# Patient Record
Sex: Male | Born: 1935 | Race: White | Hispanic: No | Marital: Married | Smoking: Former smoker
Health system: Southern US, Community
[De-identification: ages and names within clinical notes are randomized; demographics above are authoritative.]

## PROBLEM LIST (undated history)

## (undated) DIAGNOSIS — F419 Anxiety disorder, unspecified: Secondary | ICD-10-CM

## (undated) DIAGNOSIS — M1990 Unspecified osteoarthritis, unspecified site: Secondary | ICD-10-CM

## (undated) DIAGNOSIS — I639 Cerebral infarction, unspecified: Secondary | ICD-10-CM

## (undated) DIAGNOSIS — F0390 Unspecified dementia without behavioral disturbance: Secondary | ICD-10-CM

## (undated) DIAGNOSIS — I10 Essential (primary) hypertension: Secondary | ICD-10-CM

## (undated) HISTORY — DX: Unspecified osteoarthritis, unspecified site: M19.90

## (undated) HISTORY — DX: Cerebral infarction, unspecified: I63.9

## (undated) HISTORY — DX: Essential (primary) hypertension: I10

## (undated) HISTORY — DX: Unspecified dementia, unspecified severity, without behavioral disturbance, psychotic disturbance, mood disturbance, and anxiety: F03.90

## (undated) HISTORY — PX: OTHER SURGICAL HISTORY: SHX169

## (undated) HISTORY — DX: Anxiety disorder, unspecified: F41.9

---

## 2004-07-03 ENCOUNTER — Ambulatory Visit: Payer: Self-pay | Admitting: Ophthalmology

## 2004-07-10 ENCOUNTER — Ambulatory Visit: Payer: Self-pay | Admitting: Ophthalmology

## 2004-10-26 ENCOUNTER — Ambulatory Visit: Payer: Self-pay | Admitting: Ophthalmology

## 2004-10-31 ENCOUNTER — Ambulatory Visit: Payer: Self-pay | Admitting: Ophthalmology

## 2005-04-02 ENCOUNTER — Other Ambulatory Visit: Payer: Self-pay

## 2005-04-02 ENCOUNTER — Ambulatory Visit: Payer: Self-pay | Admitting: Surgery

## 2005-04-08 ENCOUNTER — Ambulatory Visit: Payer: Self-pay | Admitting: Surgery

## 2008-02-22 ENCOUNTER — Other Ambulatory Visit: Payer: Self-pay

## 2008-02-22 ENCOUNTER — Emergency Department: Payer: Self-pay

## 2008-02-24 ENCOUNTER — Ambulatory Visit: Payer: Self-pay | Admitting: Internal Medicine

## 2008-03-09 ENCOUNTER — Ambulatory Visit: Payer: Self-pay | Admitting: Orthopedic Surgery

## 2008-09-15 ENCOUNTER — Ambulatory Visit: Payer: Self-pay | Admitting: Internal Medicine

## 2009-07-12 ENCOUNTER — Ambulatory Visit: Payer: Self-pay | Admitting: Internal Medicine

## 2009-07-17 ENCOUNTER — Ambulatory Visit: Payer: Self-pay | Admitting: Vascular Surgery

## 2009-07-21 ENCOUNTER — Ambulatory Visit: Payer: Self-pay | Admitting: Vascular Surgery

## 2009-07-26 ENCOUNTER — Inpatient Hospital Stay: Payer: Self-pay | Admitting: Vascular Surgery

## 2009-08-31 ENCOUNTER — Inpatient Hospital Stay: Payer: Self-pay | Admitting: Vascular Surgery

## 2009-09-06 ENCOUNTER — Ambulatory Visit: Payer: Self-pay | Admitting: Internal Medicine

## 2012-04-15 ENCOUNTER — Ambulatory Visit: Payer: Self-pay | Admitting: Otolaryngology

## 2012-05-06 ENCOUNTER — Ambulatory Visit: Payer: Self-pay | Admitting: Otolaryngology

## 2012-08-26 HISTORY — PX: SPINE SURGERY: SHX786

## 2013-09-26 ENCOUNTER — Emergency Department: Payer: Self-pay | Admitting: Internal Medicine

## 2013-09-26 LAB — COMPREHENSIVE METABOLIC PANEL
ALBUMIN: 3.3 g/dL — AB (ref 3.4–5.0)
ALK PHOS: 46 U/L
ALT: 15 U/L (ref 12–78)
ANION GAP: 4 — AB (ref 7–16)
AST: 28 U/L (ref 15–37)
BUN: 22 mg/dL — ABNORMAL HIGH (ref 7–18)
Bilirubin,Total: 0.5 mg/dL (ref 0.2–1.0)
CO2: 27 mmol/L (ref 21–32)
Calcium, Total: 9 mg/dL (ref 8.5–10.1)
Chloride: 105 mmol/L (ref 98–107)
Creatinine: 1.14 mg/dL (ref 0.60–1.30)
EGFR (African American): >60
GFR CALC NON AF AMER: 59 — AB
Glucose: 85 mg/dL (ref 65–99)
OSMOLALITY: 275 (ref 275–301)
Potassium: 4.7 mmol/L (ref 3.5–5.1)
Sodium: 136 mmol/L (ref 136–145)
TOTAL PROTEIN: 7.4 g/dL (ref 6.4–8.2)

## 2013-09-26 LAB — URINALYSIS, COMPLETE
BILIRUBIN, UR: NEGATIVE
BLOOD: NEGATIVE
Bacteria: NONE SEEN
Glucose,UR: NEGATIVE mg/dL (ref 0–75)
Leukocyte Esterase: NEGATIVE
NITRITE: NEGATIVE
PROTEIN: NEGATIVE
Ph: 6 (ref 4.5–8.0)
SQUAMOUS EPITHELIAL: NONE SEEN
Specific Gravity: 1.015 (ref 1.003–1.030)
WBC UR: <1 /HPF (ref 0–5)

## 2013-09-26 LAB — CBC
HCT: 41.8 % (ref 40.0–52.0)
HGB: 13.9 g/dL (ref 13.0–18.0)
MCH: 29.1 pg (ref 26.0–34.0)
MCHC: 33.2 g/dL (ref 32.0–36.0)
MCV: 88 fL (ref 80–100)
Platelet: 166 10*3/uL (ref 150–440)
RBC: 4.76 10*6/uL (ref 4.40–5.90)
RDW: 14.4 % (ref 11.5–14.5)
WBC: 8 10*3/uL (ref 3.8–10.6)

## 2013-09-26 LAB — TROPONIN I: Troponin-I: <0.02 ng/mL

## 2013-09-26 LAB — CK TOTAL AND CKMB (NOT AT ARMC)
CK, Total: 86 U/L (ref 35–232)
CK-MB: 1.1 ng/mL (ref 0.5–3.6)

## 2013-09-27 ENCOUNTER — Ambulatory Visit: Payer: Self-pay | Admitting: Family Medicine

## 2013-09-29 ENCOUNTER — Ambulatory Visit: Payer: Self-pay | Admitting: Family Medicine

## 2015-07-25 ENCOUNTER — Encounter: Payer: Self-pay | Admitting: Physical Therapy

## 2015-07-25 ENCOUNTER — Ambulatory Visit: Payer: Medicare Other | Attending: Student | Admitting: Physical Therapy

## 2015-07-25 VITALS — BP 140/70

## 2015-07-25 DIAGNOSIS — R279 Unspecified lack of coordination: Secondary | ICD-10-CM | POA: Diagnosis present

## 2015-07-25 DIAGNOSIS — R269 Unspecified abnormalities of gait and mobility: Secondary | ICD-10-CM | POA: Diagnosis present

## 2015-07-25 NOTE — Patient Instructions (Addendum)
         Currently your bladder irritant to water ratio is 5: 0   And you want to eventually balance out the ratio so water is double the bladder irritant amount Bladder irritant (2 cup of coffee, 1 (8 fl oz)  of soda, 1 (12 fl oz) Tea)  Water: sipping only with medication  This week:  Decrease coffee intake from 2 cups to 1 cup,   Increase water intake to more than just when taking medications. Make sure to drink 1 serving of full glass (12 fl oz)  of water each day .

## 2015-07-26 NOTE — Therapy (Addendum)
[place] [facility] MAIN [facility] SERVICES [address] [place], [place], [address] Phone: [phone]   Fax:  [phone]  Physical Therapy Evaluation  Patient Details  Name: David [name] MRN: [id number] Date of Birth: 1926-09-18 Referring Provider: Harmon Dun   Encounter Date: 07/25/2015      PT End of Session - 07/26/15 1422    Visit Number 1   Number of Visits 8   Date for PT Re-Evaluation 09/12/15   Authorization Type G code    PT Start Time 1105   PT Stop Time 1210   PT Time Calculation (min) 65 min   Activity Tolerance Patient tolerated treatment well;No increased pain   Behavior During Therapy [facility] for tasks assessed/performed      Past Medical History  Diagnosis Date  . Anxiety   . Hypertension   . Dementia     Dx in May  . Arthritis     Rheumatoid  . Stroke [employer] [facility])     Past Surgical History  Procedure Laterality Date  . 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 Spine surgery  2014    "ruptured disc"     Filed Vitals:   07/25/15 1126  BP: 140/70    Visit Diagnosis:  Abnormality of gait - Plan: PT plan of care cert/re-cert  Lack of coordination - Plan: PT plan of care cert/re-cert      Subjective Assessment - 07/26/15 1420    Subjective Pt reports fecal incontinence for a year with worsening of Sx over the last few months. (Pt's dtr-in law reports he has started to complain about it).  Pt has had constipation in the past and  has been taking Clear Lax and suppositories  for a " couple" of years.  Currently pt reports bowel movements every other day with loose stool 50% of the time Type 7 Bristol  Stool Scale. Pt and dtr in- law reported last colonscopy 10 years ago. Denied blood in stool nor pain. Fecal and urine incontinence occur with bowel movement and urination. Pt states he sometimes has to push something back in after bowel movements. Pt continues to take Clear Lax one time a day.  Fluid Day: Dtr in law  reports pt drinks "very little water"., 1-2 cups of coffee, 8 oz of soda,  12 fl of tea.  Pt reports eating  fruits "very seldom" and  dtr -in-law reports there is poor compliance to recommendations given by dietitian.     Patient is accompained by: --  Dtr in law    Pertinent History Curerntly under stress because his wife is in a rehab facility due to "getting sick"   Patient Stated Goals " get a good appetite back, eat better foods" and decrease leakage.             [facility] PT Assessment - 07/26/15 1420    Assessment   Medical Diagnosis fecal incontinence    Referring Provider Harmon Dun    Precautions   Precautions None   Restrictions   Weight Bearing Restrictions No   Balance Screen   Has the patient fallen in the past 6 months Yes   How many times? --  2-3x  dtr-in law reported may be due to BP meds    Cognition   Memory Impaired   Dx w. dementia 6 months ago   Observation/Other Assessments   Other Surveys  Other Surveys   Coordination   Gross Motor Movements are Fluid and  Coordinated --  lumbopelvic instability with ASLR   Coordination and Movement Description abdominal/ pelvic floor mm straining w/ cue for bowel movement, pelvic floor contraction elicited with palpation at medial isch tub  through clothes   Posture/Postural Control   Posture Comments limited diaphragmatic excurion,    Bed Mobility   Bed Mobility --  half crunch,pt able to log roll IND w/ cues   Ambulation/Gait   Assistive device Straight cane   Gait Pattern --  narrow BOS, cued wider BOS: pt reported feeling more stable   Gait velocity 0.6 m/s                    [facility] Adult PT Treatment/Exercise - 07/26/15 1420    Ambulation/Gait   Gait Comments cued for wider BOS    Self-Care   Self-Care --  see pt instructions, POC, goals, anatomy and physiology   Neuro Re-ed    Neuro Re-ed Details  see pt instructions                      PT Long Term Goals - 07/26/15 1432     PT LONG TERM GOAL #1   Title Pt will decrease his COREFO score from 24% to < 20% in order to show improved bowel function.   Time 8   Period Weeks   Status New   PT LONG TERM GOAL #2   Title Pt will demo increased walking speed from 0.6 m/s w/ SPC to 0.8 m/s w/ SPC in order to decrease risk for falls as a home ambulator.   Time 8   Period Weeks   Status New   PT LONG TERM GOAL #3   Title Pt will demo proper toileting technique with proper breathing technique (less straining) and compliance to increasing water intake to 1 glass a day in order to improve bowel.urinary function.    Time 8   Period Weeks   Status New               Plan - 07/26/15 1423    Clinical Impression Statement Pt is a 81 yo male whose S & Sx consist of urinary and fecal incontinence,  poor coordination of deep core mmm,  straining of abominal and pelvic floor mm w./ cue to simulate bowel movement and with OOB technique, gait deviations, and Hx of constipation with poor bladder/bowel health habits. Plan to perform an external rectal examination w/ undergarments doffed with pt' s consent and family member present  in the room in order to address pt's c/o "pushing something  back up" after bowel movement. Internal rectal assessment will be contraindicated due to pt's dementia status.     Pt will benefit from skilled therapeutic intervention in order to improve on the following deficits Decreased coordination;Decreased safety awareness;Impaired flexibility;Postural dysfunction;Decreased range of motion;Impaired perceived functional ability;Improper body mechanics;Difficulty walking;Decreased mobility;Decreased balance;Decreased activity tolerance;Decreased strength;Decreased endurance;Abnormal gait   Rehab Potential Fair   Clinical Impairments Affecting Rehab Potential Dementia, Plan to educate pt and family on prognosis after detailed rectal exam and pt's ability to show carry-over motor control education   PT  Frequency 1x / week   PT Duration 8 weeks   PT Treatment/Interventions ADLs/Self Care Home Management;Neuromuscular re-education;Moist Heat;Traction;Electrical Stimulation;Gait training;Stair training;Functional mobility training;Therapeutic activities;Therapeutic exercise;Balance training;Manual techniques;Patient/family education   Consulted and Agree with Plan of Care Patient;Family member/caregiver   Family Member Consulted --  dtr in law  G-Codes - 07/26/15 1436    Functional Assessment Tool Used COREFO 24%   Functional Limitation Self care   Self Care Current Status (L4562) At least 20 percent but less than 40 percent impaired, limited or restricted   Self Care Goal Status (B6389) At least 1 percent but less than 20 percent impaired, limited or restricted       Problem List There are no active problems to display for this patient.   Mariane Masters  ,PT, DPT, E-RYT  07/26/2015, 2:41 PM  [place] [facility] MAIN [facility] SERVICES [address] [place], [place], [address] Phone: [phone]   Fax:  [phone]  Name: David [name] MRN: [id number] Date of Birth: [birth year]

## 2015-07-26 NOTE — Addendum Note (Signed)
Addended by: Mariane Masters on: 07/26/2015 02:40 PM   Modules accepted: Orders

## 2015-08-01 ENCOUNTER — Ambulatory Visit: Payer: Medicare Other | Attending: Student | Admitting: Physical Therapy

## 2015-08-01 DIAGNOSIS — R269 Unspecified abnormalities of gait and mobility: Secondary | ICD-10-CM

## 2015-08-01 DIAGNOSIS — R279 Unspecified lack of coordination: Secondary | ICD-10-CM | POA: Insufficient documentation

## 2015-08-01 NOTE — Patient Instructions (Signed)
Continue to following resources for water and fiber intake   To improve coordination of pelvic floor muscles:  Practice breathing while having bowel movements and avoid straining the abdomen and pelvic floor muscles.   Try squatty potty   To decrease the redness around the anus: Avoid wiping with hard pressure, buy softer toilet paper and dampen it over the sink

## 2015-08-02 NOTE — Therapy (Signed)
[facility] MAIN [facility] SERVICES [address] [place], [place], [address] Phone: [phone]   Fax:  [phone]  Physical Therapy Treatment  Patient Details  Name: David [name] MRN: [id number] Date of Birth: [birth year] Referring Provider: Harmon Dun   Encounter Date: 08/01/2015      PT End of Session - 08/02/15 1329    Visit Number 2   Number of Visits 8   Date for PT Re-Evaluation 09/12/15   Authorization Type G code    PT Start Time 1100   PT Stop Time 1200   PT Time Calculation (min) 60 min   Activity Tolerance Patient tolerated treatment well;No increased pain   Behavior During Therapy [facility] for tasks assessed/performed      Past Medical History  Diagnosis Date  . Anxiety   . Hypertension   . Dementia     Dx in May  . Arthritis     Rheumatoid  . Stroke [facility])     Past Surgical History  Procedure Laterality Date  . 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 Spine surgery  2014    "ruptured disc"     There were no vitals filed for this visit.  Visit Diagnosis:  Abnormality of gait  Lack of coordination      Subjective Assessment - 08/01/15 1114    Subjective (p) Pt reported he tried increasing water intake but son shakes his head and says they try to educate him but pt always replies "he will drink as much water as he likes". Pt had a question about colonoscopy and wanted to know if PT would perform this procedure. PT educated pt that his GI MD would be the one to do that.   Pt's son guessed that pt's last colonoscopy was probably about 5 years ago but was unsure.    Patient is accompained by: (p) --  Dtr in law    Pertinent History (p) Curerntly under stress because his wife is in a rehab facility due to "getting sick"   Patient Stated Goals (p) " get a good appetite back, eat better foods" and decrease leakage.             [facility] PT Assessment - 08/02/15 1317    Assessment   Medical Diagnosis  fecal incontinence    Cognition   Memory Impaired   Dx w. dementia 6 months ago   Ambulation/Gait   Gait Comments minor cue for BOS                  Pelvic Floor Special Questions - 08/01/15 1158    Skin Integrity --  Noted redness around anal sphincter   External Palpation skin rag noted near R posterior,   Palpation noted straining abdominal/ pelvic floor   able demo proper pelvic floor coordination           [facility] Adult PT Treatment/Exercise - 08/02/15 1317    Self-Care   Other Self-Care Comments  educated pt and son re: importance of water and fiber intake, proper pelvic floor coordination, decreased straining , use of squatty potty, using softer tissue paper and damping it  for skin integrity around anus.  PT to inform GI MD re: skin tag.    Neuro Re-ed    Neuro Re-ed Details  inhalation with relaxed pelvic floor, proper sitting posture to decrease pressure on anus  PT Education - 08/02/15 1316    Education provided Yes   Education Details Educated son and pt   Person(s) Educated Patient;Child(ren)   Methods Explanation;Demonstration;Tactile cues;Verbal cues;Handout   Comprehension Verbalized understanding;Returned demonstration;Verbal cues required;Tactile cues required             PT Long Term Goals - 07/26/15 1432    PT LONG TERM GOAL #1   Title Pt will decrease his COREFO score from 24% to < 20% in order to show improved bowel function.   Time 8   Period Weeks   Status New   PT LONG TERM GOAL #2   Title Pt will demo increased walking speed from 0.6 m/s w/ SPC to 0.8 m/s w/ SPC in order to decrease risk for falls as a home ambulator.   Time 8   Period Weeks   Status New   PT LONG TERM GOAL #3   Title Pt will demo proper toileting technique with proper breathing technique (less straining) and compliance to increasing water intake to 1 glass a day in order to improve bowel.urinary function.    Time 8   Period Weeks    Status New               Plan - 08/02/15 1329    Clinical Impression Statement PT external exam today showed pt  w/ a skin tag near R posterior area by anal sphincter which may be the reason pt reports feeling as if he has to "push something back up" and to perform additional wiping w/ washcloth after toilet paper wiping with bowel movements. Intrarectal exam was withheld due to pt's cognitive status and thus,  possibility of rectocele was not able to be ruled out. Also noted redness around anus and pt/son were educated about replacing wiping with dry harsh toilet paper with softer, dampening toilet paper. Expressed to son to monitor the redness.    Pt's son guessed that pt's last colonoscopy was probably about 5 years ago but was unsure. Pt expressed he will try to be more compliant with increasing his water intake and practicing recommendations. Provided pt and son info re: [employer] and free lunches offered there in order to promote healthy eating and  socialization. Pt expressed interest. Withholding PT for a month to allow pt to build compliance with proper toileting and bladder/ bowel health habits.    Pt will benefit from skilled therapeutic intervention in order to improve on the following deficits Decreased coordination;Decreased safety awareness;Impaired flexibility;Postural dysfunction;Decreased range of motion;Impaired perceived functional ability;Improper body mechanics;Difficulty walking;Decreased mobility;Decreased balance;Decreased activity tolerance;Decreased strength;Decreased endurance;Abnormal gait   Rehab Potential Fair   Clinical Impairments Affecting Rehab Potential dementia    PT Frequency 1x / week   PT Duration 8 weeks   PT Treatment/Interventions ADLs/Self Care Home Management;Neuromuscular re-education;Moist Heat;Traction;Electrical Stimulation;Gait training;Stair training;Functional mobility training;Therapeutic activities;Therapeutic exercise;Balance  training;Manual techniques;Patient/family education   Consulted and Agree with Plan of Care Patient;Family member/caregiver   Family Member Consulted --  dtr in law        Problem List There are no active problems to display for this patient.   Mariane Masters ,PT, DPT, E-RYT  08/02/2015, 1:39 PM  [place] MAIN [facility] SERVICES [address] [place], [place], [address] Phone: [phone]   Fax:  [phone]  Name: David [name] MRN: [id number] Date of Birth: 12-May-1927

## 2015-08-08 ENCOUNTER — Encounter: Payer: Medicare Other | Admitting: Physical Therapy

## 2015-08-15 ENCOUNTER — Encounter: Payer: Medicare Other | Admitting: Physical Therapy

## 2015-08-22 ENCOUNTER — Encounter: Payer: Medicare Other | Admitting: Physical Therapy

## 2015-08-31 ENCOUNTER — Ambulatory Visit: Payer: Medicare Other | Attending: Student | Admitting: Physical Therapy

## 2015-09-01 ENCOUNTER — Telehealth: Payer: Self-pay | Admitting: Physical Therapy

## 2016-08-08 ENCOUNTER — Emergency Department: Payer: Medicare Other

## 2016-08-08 ENCOUNTER — Encounter: Payer: Self-pay | Admitting: Intensive Care

## 2016-08-08 ENCOUNTER — Observation Stay
Admission: EM | Admit: 2016-08-08 | Discharge: 2016-08-09 | Disposition: A | Discharge status: Home / Self Care | Payer: Medicare Other | Attending: Internal Medicine | Admitting: Internal Medicine

## 2016-08-08 DIAGNOSIS — R42 Dizziness and giddiness: Secondary | ICD-10-CM | POA: Diagnosis not present

## 2016-08-08 DIAGNOSIS — N183 Chronic kidney disease, stage 3 (moderate): Secondary | ICD-10-CM | POA: Insufficient documentation

## 2016-08-08 DIAGNOSIS — Z87891 Personal history of nicotine dependence: Secondary | ICD-10-CM | POA: Insufficient documentation

## 2016-08-08 DIAGNOSIS — I700 Atherosclerosis of aorta: Secondary | ICD-10-CM | POA: Insufficient documentation

## 2016-08-08 DIAGNOSIS — F0390 Unspecified dementia without behavioral disturbance: Secondary | ICD-10-CM | POA: Diagnosis not present

## 2016-08-08 DIAGNOSIS — H409 Unspecified glaucoma: Secondary | ICD-10-CM | POA: Insufficient documentation

## 2016-08-08 DIAGNOSIS — F419 Anxiety disorder, unspecified: Secondary | ICD-10-CM | POA: Diagnosis not present

## 2016-08-08 DIAGNOSIS — R001 Bradycardia, unspecified: Secondary | ICD-10-CM

## 2016-08-08 DIAGNOSIS — I129 Hypertensive chronic kidney disease with stage 1 through stage 4 chronic kidney disease, or unspecified chronic kidney disease: Secondary | ICD-10-CM | POA: Diagnosis not present

## 2016-08-08 DIAGNOSIS — N400 Enlarged prostate without lower urinary tract symptoms: Secondary | ICD-10-CM | POA: Diagnosis not present

## 2016-08-08 DIAGNOSIS — R55 Syncope and collapse: Principal | ICD-10-CM

## 2016-08-08 DIAGNOSIS — M069 Rheumatoid arthritis, unspecified: Secondary | ICD-10-CM | POA: Insufficient documentation

## 2016-08-08 DIAGNOSIS — Z8673 Personal history of transient ischemic attack (TIA), and cerebral infarction without residual deficits: Secondary | ICD-10-CM | POA: Diagnosis not present

## 2016-08-08 LAB — CBC WITH DIFFERENTIAL/PLATELET
BASOS ABS: 0 10*3/uL (ref 0–0.1)
BASOS PCT: 1 %
EOS PCT: 3 %
Eosinophils Absolute: 0.3 10*3/uL (ref 0–0.7)
HCT: 38.2 % — ABNORMAL LOW (ref 40.0–52.0)
Hemoglobin: 13 g/dL (ref 13.0–18.0)
LYMPHS PCT: 33 %
Lymphs Abs: 2.6 10*3/uL (ref 1.0–3.6)
MCH: 29.7 pg (ref 26.0–34.0)
MCHC: 34.1 g/dL (ref 32.0–36.0)
MCV: 87 fL (ref 80.0–100.0)
Monocytes Absolute: 0.8 10*3/uL (ref 0.2–1.0)
Monocytes Relative: 10 %
Neutro Abs: 4.2 10*3/uL (ref 1.4–6.5)
Neutrophils Relative %: 53 %
PLATELETS: 173 10*3/uL (ref 150–440)
RBC: 4.39 MIL/uL — AB (ref 4.40–5.90)
RDW: 14.5 % (ref 11.5–14.5)
WBC: 7.9 10*3/uL (ref 3.8–10.6)

## 2016-08-08 LAB — COMPREHENSIVE METABOLIC PANEL
ALBUMIN: 3.3 g/dL — AB (ref 3.5–5.0)
ALT: 13 U/L — AB (ref 17–63)
AST: 19 U/L (ref 15–41)
Alkaline Phosphatase: 39 U/L (ref 38–126)
Anion gap: 4 — ABNORMAL LOW (ref 5–15)
BUN: 23 mg/dL — AB (ref 6–20)
CHLORIDE: 104 mmol/L (ref 101–111)
CO2: 30 mmol/L (ref 22–32)
CREATININE: 1.36 mg/dL — AB (ref 0.61–1.24)
Calcium: 9.1 mg/dL (ref 8.9–10.3)
GFR calc Af Amer: 52 mL/min — ABNORMAL LOW (ref >60)
GFR calc non Af Amer: 45 mL/min — ABNORMAL LOW (ref >60)
GLUCOSE: 91 mg/dL (ref 65–99)
Potassium: 4.7 mmol/L (ref 3.5–5.1)
SODIUM: 138 mmol/L (ref 135–145)
Total Bilirubin: 0.4 mg/dL (ref 0.3–1.2)
Total Protein: 6.4 g/dL — ABNORMAL LOW (ref 6.5–8.1)

## 2016-08-08 LAB — GLUCOSE, CAPILLARY: Glucose-Capillary: 79 mg/dL (ref 65–99)

## 2016-08-08 LAB — TROPONIN I: Troponin I: <0.03 ng/mL (ref <0.03)

## 2016-08-08 MED ORDER — HEPARIN SODIUM (PORCINE) 5000 UNIT/ML IJ SOLN
5000.0000 [IU] | Freq: Three times a day (TID) | INTRAMUSCULAR | Status: DC
  Administered 2016-08-08 – 2016-08-09 (×3): 5000 [IU] via SUBCUTANEOUS
  Filled 2016-08-08 (×4): qty 1

## 2016-08-08 MED ORDER — TIMOLOL MALEATE 0.5 % OP SOLN
1.0000 [drp] | Freq: Every day | OPHTHALMIC | Status: DC
  Administered 2016-08-09: 1 [drp] via OPHTHALMIC
  Filled 2016-08-08: qty 5

## 2016-08-08 MED ORDER — SODIUM CHLORIDE 0.9% FLUSH
3.0000 mL | Freq: Two times a day (BID) | INTRAVENOUS | Status: DC
  Administered 2016-08-08 – 2016-08-09 (×3): 3 mL via INTRAVENOUS

## 2016-08-08 MED ORDER — GABAPENTIN 100 MG PO CAPS
100.0000 mg | ORAL_CAPSULE | Freq: Two times a day (BID) | ORAL | Status: DC
  Administered 2016-08-08 – 2016-08-09 (×2): 100 mg via ORAL
  Filled 2016-08-08 (×2): qty 1

## 2016-08-08 MED ORDER — TAMSULOSIN HCL 0.4 MG PO CAPS
0.4000 mg | ORAL_CAPSULE | Freq: Every evening | ORAL | Status: DC
  Administered 2016-08-08: 0.4 mg via ORAL
  Filled 2016-08-08: qty 1

## 2016-08-08 MED ORDER — DONEPEZIL HCL 5 MG PO TABS
5.0000 mg | ORAL_TABLET | Freq: Every day | ORAL | Status: DC
  Administered 2016-08-08: 5 mg via ORAL
  Filled 2016-08-08: qty 1

## 2016-08-08 MED ORDER — SODIUM CHLORIDE 0.9 % IV SOLN
Freq: Once | INTRAVENOUS | Status: AC
  Administered 2016-08-08: 11:00:00 via INTRAVENOUS

## 2016-08-08 MED ORDER — LATANOPROST 0.005 % OP SOLN
1.0000 [drp] | Freq: Every day | OPHTHALMIC | Status: DC
  Administered 2016-08-08: 1 [drp] via OPHTHALMIC
  Filled 2016-08-08: qty 2.5

## 2016-08-08 NOTE — ED Triage Notes (Signed)
PAtient arrived by EMS from [place] [facility] for near syncope. Patient states "I was sitting in the chair and started to feel bad and leaned over. The next thing I remember is laying on the floor." EMS reports patient did not fall and he was lowered to the ground. A&O x4 upon arrival

## 2016-08-08 NOTE — ED Notes (Signed)
Admitting md at bedside

## 2016-08-08 NOTE — ED Provider Notes (Signed)
[facility] Emergency Department Provider Note        Time seen: ----------------------------------------- 10:30 AM on 08/08/2016 -----------------------------------------    I have reviewed the triage vital signs and the nursing notes.   HISTORY  Chief Complaint Near Syncope    HPI David [name] is a 82 y.o. male who presents to the ER for near syncope. Patient was eating breakfast at a local restaurant states he was sitting in the chair and started to feel bad and leaned over. The next thing he remembers he was lying on the floor. EMS reports she did not fall and was lowered to the ground. He denies recent illness. Family states he does not take blood pressure medication, has a chronic history of bradycardia but was not felt to need a pacemaker. Patient states he is not gaining or losing weight.   Past Medical History:  Diagnosis Date  . Anxiety   . Arthritis    Rheumatoid  . Dementia    Dx in May  . Hypertension   . Stroke [facility])     There are no active problems to display for this patient.   Past Surgical History:  Procedure Laterality Date  . 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 SPINE SURGERY  2014   "ruptured disc"     Allergies Patient has no known allergies.  Social History Social History  Substance Use Topics  . Smoking status: Former Smoker    Types: Cigarettes  . Smokeless tobacco: Never Used  . Alcohol use No    Review of Systems Constitutional: Negative for fever. Cardiovascular: Negative for chest pain. Respiratory: Negative for shortness of breath. Gastrointestinal: Negative for abdominal pain, vomiting and diarrhea. Genitourinary: Negative for dysuria. Musculoskeletal: Negative for back pain. Skin: Negative for rash. Neurological: Negative for headaches, focal weakness or numbness.  10-point ROS otherwise negative.  ____________________________________________   PHYSICAL EXAM:  VITAL  SIGNS: ED Triage Vitals  Enc Vitals Group     BP 08/08/16 1005 (!) 179/58     Pulse Rate 08/08/16 1005 (!) 50     Resp 08/08/16 1005 16     Temp 08/08/16 1005 97.7 F (36.5 C)     Temp Source 08/08/16 1005 Oral     SpO2 08/08/16 1005 100 %     Weight 08/08/16 1006 178 lb (80.7 kg)     Height 08/08/16 1006 6\' 1"  (1.854 m)     Head Circumference --      Peak Flow --      Pain Score --      Pain Loc --      Pain Edu? --      Excl. in GC? --     Constitutional: Alert and oriented. Well appearing and in no distress. Eyes: Conjunctivae are normal. PERRL. Normal extraocular movements. ENT   Head: Normocephalic and atraumatic.   Nose: No congestion/rhinnorhea.   Mouth/Throat: Mucous membranes are moist.   Neck: No stridor. Cardiovascular: Slow rate, regular rhythm. No murmurs, rubs, or gallops. Respiratory: Normal respiratory effort without tachypnea nor retractions. Breath sounds are clear and equal bilaterally. No wheezes/rales/rhonchi. Gastrointestinal: Soft and nontender. Normal bowel sounds Musculoskeletal: Nontender with normal range of motion in all extremities. No lower extremity tenderness nor edema. Neurologic:  Normal speech and language. No gross focal neurologic deficits are appreciated.  Skin:  Skin is warm, dry and intact. No rash noted. Psychiatric: Mood and affect are normal. Speech and behavior are normal.  ____________________________________________  EKG: Interpreted by me. Sinus bradycardia with rate of 45 bpm, normal PR interval, normal QRS, normal QT, normal axis.  ____________________________________________  ED COURSE:  Pertinent labs & imaging results that were available during my care of the patient were reviewed by me and considered in my medical decision making (see chart for details). Clinical Course   Patient presents to ER with syncope, questionably heart rate related. He was orthostatic when evaluated by EMS. Patient received IV fluids,  we will check basic labs.  Procedures ____________________________________________   LABS (pertinent positives/negatives)  Labs Reviewed  CBC WITH DIFFERENTIAL/PLATELET - Abnormal; Notable for the following:       Result Value   RBC 4.39 (*)    HCT 38.2 (*)    All other components within normal limits  COMPREHENSIVE METABOLIC PANEL - Abnormal; Notable for the following:    BUN 23 (*)    Creatinine, Ser 1.36 (*)    Total Protein 6.4 (*)    Albumin 3.3 (*)    ALT 13 (*)    GFR calc non Af Amer 45 (*)    GFR calc Af Amer 52 (*)    Anion gap 4 (*)    All other components within normal limits  TROPONIN I    RADIOLOGY Images were viewed by me  Chest x-ray IMPRESSION: 1. No active cardiopulmonary disease. 2. Aortic Atherosclerosis (ICD10-170.0) ____________________________________________  FINAL ASSESSMENT AND PLAN  Syncope, bradycardia  Plan: Patient with labs and imaging as dictated above. Patient presents to the ER in no acute distress. Patient had a true syncopal event. He will likely need admission and consideration for pacemaker. He has been bradycardic throughout his stay here.   Emily Filbert, MD   Note: This dictation was prepared with Dragon dictation. Any transcriptional errors that result from this process are unintentional    Emily Filbert, MD 08/08/16 1133

## 2016-08-08 NOTE — H&P (Signed)
[facility] - [place] at [facility]   PATIENT NAME: David [name]    MR#:  [id number]  DATE OF BIRTH:  13-Dec-1926  DATE OF ADMISSION:  08/08/2016  PRIMARY CARE PHYSICIAN: WHITE, Arlyss Repress, NP   REQUESTING/REFERRING PHYSICIAN: Williams  CHIEF COMPLAINT:   Chief Complaint  Patient presents with  . Near Syncope    HISTORY OF PRESENT ILLNESS: David [name]  is a 82 y.o. male with a known history of Anxiety, arthritis, dementia, hypertension, stroke, chronic dizziness- he has dizziness problem for many years, extensive workup was done. Also referred to cardiologist and given Holter monitor in the past which showed some irregular heart beats and some bradycardia episodes but no further cardiac intervention was recommended. He was also taken off from his blood pressure medicine because blood pressure was dropping orthostatic. Today morning after he ate his breakfast he was still sitting and felt very dizzy and almost had a syncopal episode so brought to emergency room. Vitals and blood pressure is stable but he was noted to be slightly bradycardic in ER and so given to hospitalist team for further management.  PAST MEDICAL HISTORY:   Past Medical History:  Diagnosis Date  . Anxiety   . Arthritis    Rheumatoid  . Dementia    Dx in May  . Hypertension   . Stroke [facility])     PAST SURGICAL HISTORY: Past Surgical History:  Procedure Laterality Date  . 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 SPINE SURGERY  2014   "ruptured disc"     SOCIAL HISTORY:  Social History  Substance Use Topics  . Smoking status: Former Smoker    Types: Cigarettes  . Smokeless tobacco: Never Used  . Alcohol use No    FAMILY HISTORY:  Family History  Problem Relation Age of Onset  . Hypertension Mother     DRUG ALLERGIES: No Known Allergies  REVIEW OF SYSTEMS:   CONSTITUTIONAL: No fever, fatigue or weakness.  EYES: No blurred or double vision.  EARS, NOSE, AND  THROAT: No tinnitus or ear pain.  RESPIRATORY: No cough, shortness of breath, wheezing or hemoptysis.  CARDIOVASCULAR: No chest pain, orthopnea, edema.  GASTROINTESTINAL: No nausea, vomiting, diarrhea or abdominal pain.  GENITOURINARY: No dysuria, hematuria.  ENDOCRINE: No polyuria, nocturia,  HEMATOLOGY: No anemia, easy bruising or bleeding SKIN: No rash or lesion. MUSCULOSKELETAL: No joint pain or arthritis.   NEUROLOGIC: No tingling, numbness, weakness.  PSYCHIATRY: No anxiety or depression.   MEDICATIONS AT HOME:  Prior to Admission medications   Medication Sig Start Date End Date Taking? Authorizing Provider  donepezil (ARICEPT) 5 MG tablet Take 5 mg by mouth at bedtime.   Yes Historical Provider, MD  gabapentin (NEURONTIN) 100 MG capsule Take 100 mg by mouth 2 (two) times daily.   Yes Historical Provider, MD  hydrALAZINE (APRESOLINE) 10 MG tablet Take 10 mg by mouth daily as needed. 06/19/16 06/19/17 Yes Historical Provider, MD  latanoprost (XALATAN) 0.005 % ophthalmic solution Place 1 drop into both eyes.  08/03/16  Yes Historical Provider, MD  lisinopril (PRINIVIL,ZESTRIL) 5 MG tablet Take 5 mg by mouth daily.   Yes Historical Provider, MD  tamsulosin (FLOMAX) 0.4 MG CAPS capsule Take 0.4 mg by mouth.   Yes Historical Provider, MD  timolol (TIMOPTIC) 0.5 % ophthalmic solution Place 1 drop into both eyes.  05/16/16  Yes Historical Provider, MD      PHYSICAL EXAMINATION:   VITAL SIGNS: Blood pressure (!) 169/59,  pulse (!) 57, temperature 97.7 F (36.5 C), temperature source Oral, resp. rate 16, height 6\' 1"  (1.854 m), weight 80.7 kg (178 lb), SpO2 100 %.  GENERAL:  82 y.o.-year-old patient lying in the bed with no acute distress.  EYES: Pupils equal, round, reactive to light and accommodation. No scleral icterus. Extraocular muscles intact.  HEENT: Head atraumatic, normocephalic. Oropharynx and nasopharynx clear.  NECK:  Supple, no jugular venous distention. No thyroid  enlargement, no tenderness.  LUNGS: Normal breath sounds bilaterally, no wheezing, rales,rhonchi or crepitation. No use of accessory muscles of respiration.  CARDIOVASCULAR: S1, S2 normal. No murmurs, rubs, or gallops.  ABDOMEN: Soft, nontender, nondistended. Bowel sounds present. No organomegaly or mass.  EXTREMITIES: No pedal edema, cyanosis, or clubbing.  NEUROLOGIC: Cranial nerves II through XII are intact. Muscle strength 4/5 in all extremities. Sensation intact. Gait not checked.  PSYCHIATRIC: The patient is alert and oriented x 3.  SKIN: No obvious rash, lesion, or ulcer.   LABORATORY PANEL:   CBC  Recent Labs Lab 08/08/16 1012  WBC 7.9  HGB 13.0  HCT 38.2*  PLT 173  MCV 87.0  MCH 29.7  MCHC 34.1  RDW 14.5  LYMPHSABS 2.6  MONOABS 0.8  EOSABS 0.3  BASOSABS 0.0   ------------------------------------------------------------------------------------------------------------------  Chemistries   Recent Labs Lab 08/08/16 1012  NA 138  K 4.7  CL 104  CO2 30  GLUCOSE 91  BUN 23*  CREATININE 1.36*  CALCIUM 9.1  AST 19  ALT 13*  ALKPHOS 39  BILITOT 0.4   ------------------------------------------------------------------------------------------------------------------ estimated creatinine clearance is 43.2 mL/min (by C-G formula based on SCr of 1.36 mg/dL (H)). ------------------------------------------------------------------------------------------------------------------ No results for input(s): TSH, T4TOTAL, T3FREE, THYROIDAB in the last 72 hours.  Invalid input(s): FREET3   Coagulation profile No results for input(s): INR, PROTIME in the last 168 hours. ------------------------------------------------------------------------------------------------------------------- No results for input(s): DDIMER in the last 72 hours. -------------------------------------------------------------------------------------------------------------------  Cardiac  Enzymes  Recent Labs Lab 08/08/16 1012  TROPONINI <0.03   ------------------------------------------------------------------------------------------------------------------ Invalid input(s): POCBNP  ---------------------------------------------------------------------------------------------------------------  Urinalysis    Component Value Date/Time   COLORURINE Yellow 09/26/2013 1256   APPEARANCEUR Clear 09/26/2013 1256   LABSPEC 1.015 09/26/2013 1256   PHURINE 6.0 09/26/2013 1256   GLUCOSEU Negative 09/26/2013 1256   HGBUR Negative 09/26/2013 1256   BILIRUBINUR Negative 09/26/2013 1256   KETONESUR Trace 09/26/2013 1256   PROTEINUR Negative 09/26/2013 1256   NITRITE Negative 09/26/2013 1256   LEUKOCYTESUR Negative 09/26/2013 1256     RADIOLOGY: Dg Chest Portable 1 View  Result Date: 08/08/2016 CLINICAL DATA:  Near syncope. EXAM: PORTABLE CHEST 1 VIEW COMPARISON:  09/26/2013 FINDINGS: The lungs are hyperinflated likely secondary to COPD. There is no focal parenchymal opacity. There is no pleural effusion or pneumothorax. The heart and mediastinal contours are unremarkable. There is thoracic aortic atherosclerosis. The osseous structures are unremarkable. IMPRESSION: 1. No active cardiopulmonary disease. 2.  Aortic Atherosclerosis (ICD10-170.0) Electronically Signed   By: 11/24/2013   On: 08/08/2016 10:51    EKG: Orders placed or performed during the hospital encounter of 08/08/16  . EKG 12-Lead  . EKG 12-Lead    IMPRESSION AND PLAN:   * Syncopal episode   Patient has some bradycardia on his cardiac monitoring here.   In past on Holter monitoring also noted to have bradycardia on cardiology visit.   Interim on telemetry and call cardiology consult, he is not on any beta blockers or other antihypertensive medications.   Check orthostatic vitals. But this episode happened  while he was still sitting after his breakfast without any change in his position.    Cardiology  may need to reevaluate for pacemaker placement depending on telemetry findings.  * Glaucoma   Continue eye drops.  * BPH     Continue Flomax.  * CKD stage 3- stable.  All the records are reviewed and case discussed with ED provider. Management plans discussed with the patient, family and they are in agreement.  CODE STATUS: DNR Code Status History    This patient does not have a recorded code status. Please follow your organizational policy for patients in this situation.       TOTAL TIME TAKING CARE OF THIS PATIENT: 50 minutes.    Altamese Dilling M.D on 08/08/2016   Between 7am to 6pm - Pager - [phone]  After 6pm go to [email or web address] - Social research officer, government  [facility] [place] [facility]  Office  [phone]  CC: Primary care physician; WHITE, Arlyss Repress, NP   Note: This dictation was prepared with Dragon dictation along with smaller phrase technology. Any transcriptional errors that result from this process are unintentional.

## 2016-08-08 NOTE — ED Notes (Signed)
Attempted to call report. RN at bedside with another patient and will call right back

## 2016-08-08 NOTE — ED Notes (Signed)
Pt transported to room 240 by ER Tech of whom just became available to transport.

## 2016-08-09 LAB — CBC
HCT: 37.5 % — ABNORMAL LOW (ref 40.0–52.0)
Hemoglobin: 12.5 g/dL — ABNORMAL LOW (ref 13.0–18.0)
MCH: 29.6 pg (ref 26.0–34.0)
MCHC: 33.3 g/dL (ref 32.0–36.0)
MCV: 88.9 fL (ref 80.0–100.0)
PLATELETS: 151 10*3/uL (ref 150–440)
RBC: 4.21 MIL/uL — ABNORMAL LOW (ref 4.40–5.90)
RDW: 14.5 % (ref 11.5–14.5)
WBC: 5.9 10*3/uL (ref 3.8–10.6)

## 2016-08-09 LAB — BASIC METABOLIC PANEL
Anion gap: 5 (ref 5–15)
BUN: 22 mg/dL — AB (ref 6–20)
CALCIUM: 8.6 mg/dL — AB (ref 8.9–10.3)
CHLORIDE: 109 mmol/L (ref 101–111)
CO2: 26 mmol/L (ref 22–32)
CREATININE: 1.31 mg/dL — AB (ref 0.61–1.24)
GFR calc Af Amer: 55 mL/min — ABNORMAL LOW (ref >60)
GFR calc non Af Amer: 47 mL/min — ABNORMAL LOW (ref >60)
Glucose, Bld: 84 mg/dL (ref 65–99)
Potassium: 4.3 mmol/L (ref 3.5–5.1)
SODIUM: 140 mmol/L (ref 135–145)

## 2016-08-09 NOTE — Care Management Obs Status (Signed)
MEDICARE OBSERVATION STATUS NOTIFICATION   Patient Details  Name: David [name] MRN: [id number] Date of Birth: 21-Oct-1926   Medicare Observation Status Notification Given:  Yes    Eber Hong, RN 08/09/2016, 1:54 PM

## 2016-08-09 NOTE — Discharge Summary (Signed)
[facility] - [place] at [facility]   PATIENT NAME: David [name]    MR#:  [id number]  DATE OF BIRTH:  03/05/1927  DATE OF ADMISSION:  08/08/2016 ADMITTING PHYSICIAN: Altamese Dilling, MD  DATE OF DISCHARGE: 08/09/16  PRIMARY CARE PHYSICIAN: WHITE, Arlyss Repress, NP    ADMISSION DIAGNOSIS:  Syncope and collapse [R55] Bradycardia [R00.1]  DISCHARGE DIAGNOSIS:  Syncope Bradycardia (HR 55-59) Chronic dizziness  SECONDARY DIAGNOSIS:   Past Medical History:  Diagnosis Date  . Anxiety   . Arthritis    Rheumatoid  . Dementia    Dx in May  . Hypertension   . Stroke [facility])     HOSPITAL COURSE:  Clydell Sposito  is a 82 y.o. male with a known history of Anxiety, arthritis, dementia, hypertension, stroke, chronic dizziness- he has dizziness problem for many years, extensive workup was done. Also referred to cardiologist and given Holter monitor in the past which showed some irregular heart beats and some bradycardia episodes  * Syncopal episode   Patient has some bradycardia on his cardiac monitoring here.   In past on Holter monitoring also noted to have bradycardia on cardiology visit -seen by  Cardiology Dr Juliann Pares. Recommends pt be seen on Monday for holter placement -may need to reevaluate for pacemaker placement depending on telemetry findings.  * Glaucoma   Continue eye drops.  * BPH     Continue Flomax.  * CKD stage 3- stable.  D/w dter-in-law  CONSULTS OBTAINED:  Treatment Team:  Alwyn Pea, MD  DRUG ALLERGIES:  No Known Allergies  DISCHARGE MEDICATIONS:   Current Discharge Medication List    CONTINUE these medications which have NOT CHANGED   Details  donepezil (ARICEPT) 5 MG tablet Take 5 mg by mouth at bedtime.    gabapentin (NEURONTIN) 100 MG capsule Take 100 mg by mouth 2 (two) times daily.    hydrALAZINE (APRESOLINE) 10 MG tablet Take 10 mg by mouth daily as needed.    latanoprost (XALATAN) 0.005 %  ophthalmic solution Place 1 drop into both eyes.     lisinopril (PRINIVIL,ZESTRIL) 5 MG tablet Take 5 mg by mouth daily.    tamsulosin (FLOMAX) 0.4 MG CAPS capsule Take 0.4 mg by mouth.    timolol (TIMOPTIC) 0.5 % ophthalmic solution Place 1 drop into both eyes.         If you experience worsening of your admission symptoms, develop shortness of breath, life threatening emergency, suicidal or homicidal thoughts you must seek medical attention immediately by calling 911 or calling your MD immediately  if symptoms less severe.  You Must read complete instructions/literature along with all the possible adverse reactions/side effects for all the Medicines you take and that have been prescribed to you. Take any new Medicines after you have completely understood and accept all the possible adverse reactions/side effects.   Please note  You were cared for by a hospitalist during your hospital stay. If you have any questions about your discharge medications or the care you received while you were in the hospital after you are discharged, you can call the unit and asked to speak with the hospitalist on call if the hospitalist that took care of you is not available. Once you are discharged, your primary care physician will handle any further medical issues. Please note that NO REFILLS for any discharge medications will be authorized once you are discharged, as it is imperative that you return to your primary care physician (or establish a relationship  with a primary care physician if you do not have one) for your aftercare needs so that they can reassess your need for medications and monitor your lab values. Today   SUBJECTIVE   Doing well  VITAL SIGNS:  Blood pressure (!) 116/45, pulse (!) 58, temperature 98.3 F (36.8 C), temperature source Oral, resp. rate 18, height 6\' 1"  (1.854 m), weight 80.7 kg (178 lb), SpO2 97 %.  I/O:   Intake/Output Summary (Last 24 hours) at 08/09/16 1331 Last data  filed at 08/09/16 1007  Gross per 24 hour  Intake              480 ml  Output                0 ml  Net              480 ml    PHYSICAL EXAMINATION:  GENERAL:  82 y.o.-year-old patient lying in the bed with no acute distress.  EYES: Pupils equal, round, reactive to light and accommodation. No scleral icterus. Extraocular muscles intact.  HEENT: Head atraumatic, normocephalic. Oropharynx and nasopharynx clear.  NECK:  Supple, no jugular venous distention. No thyroid enlargement, no tenderness.  LUNGS: Normal breath sounds bilaterally, no wheezing, rales,rhonchi or crepitation. No use of accessory muscles of respiration.  CARDIOVASCULAR: S1, S2 normal. No murmurs, rubs, or gallops.  ABDOMEN: Soft, non-tender, non-distended. Bowel sounds present. No organomegaly or mass.  EXTREMITIES: No pedal edema, cyanosis, or clubbing.  NEUROLOGIC: Cranial nerves II through XII are intact. Muscle strength 5/5 in all extremities. Sensation intact. Gait not checked.  PSYCHIATRIC: The patient is alert and oriented x 3.  SKIN: No obvious rash, lesion, or ulcer.   DATA REVIEW:   CBC   Recent Labs Lab 08/09/16 0518  WBC 5.9  HGB 12.5*  HCT 37.5*  PLT 151    Chemistries   Recent Labs Lab 08/08/16 1012 08/09/16 0518  NA 138 140  K 4.7 4.3  CL 104 109  CO2 30 26  GLUCOSE 91 84  BUN 23* 22*  CREATININE 1.36* 1.31*  CALCIUM 9.1 8.6*  AST 19  --   ALT 13*  --   ALKPHOS 39  --   BILITOT 0.4  --     Microbiology Results   No results found for this or any previous visit (from the past 240 hour(s)).  RADIOLOGY:  Dg Chest Portable 1 View  Result Date: 08/08/2016 CLINICAL DATA:  Near syncope. EXAM: PORTABLE CHEST 1 VIEW COMPARISON:  09/26/2013 FINDINGS: The lungs are hyperinflated likely secondary to COPD. There is no focal parenchymal opacity. There is no pleural effusion or pneumothorax. The heart and mediastinal contours are unremarkable. There is thoracic aortic atherosclerosis. The  osseous structures are unremarkable. IMPRESSION: 1. No active cardiopulmonary disease. 2.  Aortic Atherosclerosis (ICD10-170.0) Electronically Signed   By: 11/24/2013   On: 08/08/2016 10:51     Management plans discussed with the patient, family and they are in agreement.  CODE STATUS:     Code Status Orders        Start     Ordered   08/08/16 1457  Do not attempt resuscitation (DNR)  Continuous    Question Answer Comment  In the event of cardiac or respiratory ARREST Do not call a "code blue"   In the event of cardiac or respiratory ARREST Do not perform Intubation, CPR, defibrillation or ACLS   In the event of cardiac or respiratory ARREST Use medication by  any route, position, wound care, and other measures to relive pain and suffering. May use oxygen, suction and manual treatment of airway obstruction as needed for comfort.      08/08/16 1456    Code Status History    Date Active Date Inactive Code Status Order ID Comments User Context   This patient has a current code status but no historical code status.    Advance Directive Documentation   Flowsheet Row Most Recent Value  Type of Advance Directive  Healthcare Power of Attorney  Pre-existing out of facility DNR order (yellow form or pink MOST form)  No data  "MOST" Form in Place?  No data      TOTAL TIME TAKING CARE OF THIS PATIENT: 40 minutes.    Shavaun Osterloh M.D on 08/09/2016 at 1:31 PM  Between 7am to 6pm - Pager - [phone] After 6pm go to [email or web address] - password [email or web address] [facility]  [facility]  Office  [phone]  CC: Primary care physician; WHITE, Arlyss RepressELIZABETH BURNEY, NP

## 2016-08-09 NOTE — Consult Note (Signed)
Reason for Consult: Syncope vertigo bradycardia Referring Physician: Primary physician is David Jericho NP, hospitalist Dr. Anselm [name] Cardiologist Dr. Raliegh [name] is an 82 y.o. male.  HPI: Presents with near-syncope while arguing with his daughter patient has a history of anxiety as well as dementia hypertension with occasional orthostatic hypertension previous stroke now has significant bradycardia. Patient was found to have significant irregular heart rate as well as bradycardia still has recurrent persistent vertigo denies any significant pain. Patient has been off all blood pressure medications because of hypotension and only takes hydralazine for spikes of hypertension. Patient has history of chronic renal insufficiency and has significant dementia here for follow-up because of near syncope while eating  Past Medical History:  Diagnosis Date  . Anxiety   . Arthritis    Rheumatoid  . Dementia    Dx in May  . Hypertension   . Stroke [facility])     Past Surgical History:  Procedure Laterality Date  . 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 [place] SURGERY  2014   "ruptured disc"     Family History  Problem Relation Age of Onset  . Hypertension Mother     Social History:  reports that he has quit smoking. His smoking use included Cigarettes. He has never used smokeless tobacco. He reports that he does not drink alcohol. His drug history is not on file.  Allergies: No Known Allergies  Medications: I have reviewed the patient's current medications.  Results for orders placed or performed during the hospital encounter of 08/08/16 (from the past 48 hour(s))  CBC with Differential     Status: Abnormal   Collection Time: 08/08/16 10:12 AM  Result Value Ref Range   WBC 7.9 3.8 - 10.6 K/uL   RBC 4.39 (L) 4.40 - 5.90 MIL/uL   Hemoglobin 13.0 13.0 - 18.0 g/dL   HCT 38.2 (L) 40.0 - 52.0 %   MCV 87.0 80.0 - 100.0 fL   MCH 29.7 26.0 - 34.0 pg   MCHC 34.1 32.0  - 36.0 g/dL   RDW 14.5 11.5 - 14.5 %   Platelets 173 150 - 440 K/uL   Neutrophils Relative % 53 %   Neutro Abs 4.2 1.4 - 6.5 K/uL   Lymphocytes Relative 33 %   Lymphs Abs 2.6 1.0 - 3.6 K/uL   Monocytes Relative 10 %   Monocytes Absolute 0.8 0.2 - 1.0 K/uL   Eosinophils Relative 3 %   Eosinophils Absolute 0.3 0 - 0.7 K/uL   Basophils Relative 1 %   Basophils Absolute 0.0 0 - 0.1 K/uL  Comprehensive metabolic panel     Status: Abnormal   Collection Time: 08/08/16 10:12 AM  Result Value Ref Range   Sodium 138 135 - 145 mmol/L   Potassium 4.7 3.5 - 5.1 mmol/L   Chloride 104 101 - 111 mmol/L   CO2 30 22 - 32 mmol/L   Glucose, Bld 91 65 - 99 mg/dL   BUN 23 (H) 6 - 20 mg/dL   Creatinine, Ser 1.36 (H) 0.61 - 1.24 mg/dL   Calcium 9.1 8.9 - 10.3 mg/dL   Total Protein 6.4 (L) 6.5 - 8.1 g/dL   Albumin 3.3 (L) 3.5 - 5.0 g/dL   AST 19 15 - 41 U/L   ALT 13 (L) 17 - 63 U/L   Alkaline Phosphatase 39 38 - 126 U/L   Total Bilirubin 0.4 0.3 - 1.2 mg/dL   GFR calc non Af Wyvonnia Lora  45 (L) >60 mL/min   GFR calc Af Amer 52 (L) >60 mL/min    Comment: (NOTE) The eGFR has been calculated using the CKD EPI equation. This calculation has not been validated in all clinical situations. eGFR's persistently <60 mL/min signify possible Chronic Kidney Disease.    Anion gap 4 (L) 5 - 15  Troponin I     Status: None   Collection Time: 08/08/16 10:12 AM  Result Value Ref Range   Troponin I <0.03 <0.03 ng/mL  Glucose, capillary     Status: None   Collection Time: 08/08/16  9:31 PM  Result Value Ref Range   Glucose-Capillary 79 65 - 99 mg/dL  Basic metabolic panel     Status: Abnormal   Collection Time: 08/09/16  5:18 AM  Result Value Ref Range   Sodium 140 135 - 145 mmol/L   Potassium 4.3 3.5 - 5.1 mmol/L   Chloride 109 101 - 111 mmol/L   CO2 26 22 - 32 mmol/L   Glucose, Bld 84 65 - 99 mg/dL   BUN 22 (H) 6 - 20 mg/dL   Creatinine, Ser 1.31 (H) 0.61 - 1.24 mg/dL   Calcium 8.6 (L) 8.9 - 10.3 mg/dL   GFR  calc non Af Amer 47 (L) >60 mL/min   GFR calc Af Amer 55 (L) >60 mL/min    Comment: (NOTE) The eGFR has been calculated using the CKD EPI equation. This calculation has not been validated in all clinical situations. eGFR's persistently <60 mL/min signify possible Chronic Kidney Disease.    Anion gap 5 5 - 15  CBC     Status: Abnormal   Collection Time: 08/09/16  5:18 AM  Result Value Ref Range   WBC 5.9 3.8 - 10.6 K/uL   RBC 4.21 (L) 4.40 - 5.90 MIL/uL   Hemoglobin 12.5 (L) 13.0 - 18.0 g/dL   HCT 37.5 (L) 40.0 - 52.0 %   MCV 88.9 80.0 - 100.0 fL   MCH 29.6 26.0 - 34.0 pg   MCHC 33.3 32.0 - 36.0 g/dL   RDW 14.5 11.5 - 14.5 %   Platelets 151 150 - 440 K/uL    Dg Chest Portable 1 View  Result Date: 08/08/2016 CLINICAL DATA:  Near syncope. EXAM: PORTABLE CHEST 1 VIEW COMPARISON:  09/26/2013 FINDINGS: The lungs are hyperinflated likely secondary to COPD. There is no focal parenchymal opacity. There is no pleural effusion or pneumothorax. The heart and mediastinal contours are unremarkable. There is thoracic aortic atherosclerosis. The osseous structures are unremarkable. IMPRESSION: 1. No active cardiopulmonary disease. 2.  Aortic Atherosclerosis (ICD10-170.0) Electronically Signed   By: Kathreen Devoid   On: 08/08/2016 10:51    Review of Systems  Constitutional: Positive for malaise/fatigue.  Eyes: Negative.   Respiratory: Negative.   Cardiovascular: Positive for palpitations.  Gastrointestinal: Negative.   Genitourinary: Negative.   Musculoskeletal: Negative.   Skin: Negative.   Neurological: Positive for weakness.  Endo/Heme/Allergies: Negative.   Psychiatric/Behavioral: Positive for depression and memory loss.   Blood pressure (!) 139/49, pulse (!) 59, temperature 98.3 F (36.8 C), temperature source Oral, resp. rate 18, height _0  (1.854 m), weight 80.7 kg (178 lb), SpO2 97 %. Physical Exam  Nursing note and vitals reviewed. Constitutional: He is oriented to person,  place, and time. He appears well-developed and well-nourished.  HENT:  Head: Normocephalic and atraumatic.  Eyes: Conjunctivae and EOM are normal. Pupils are equal, round, and reactive to light.  Neck: Normal range of motion. Neck supple.  Cardiovascular: Normal pulses.   Extrasystoles are present. Bradycardia present.  Exam reveals gallop and distant heart sounds.   Murmur heard.  Systolic murmur is present with a grade of 2/6  Respiratory: Effort normal and breath sounds normal.  GI: Soft. Bowel sounds are normal.  Musculoskeletal: Normal range of motion.  Neurological: He is alert and oriented to person, place, and time. He has normal reflexes.  Skin: Skin is warm and dry.  Psychiatric: He has a normal mood and affect.    Assessment/Plan: Near-syncope Bradycardia Arthritis rheumatoid Anxiety Dementia Hypertension History of CVA Chronic renal insufficiency . Plan Agree with admission to telemetry No clear indication for permanent pacemaker Recommend outpatient Holter Agree with Aricept therapy for dementia Hypertension control with when necessary with hydralazine Orthostatic hypotension intermittently possibly related to Fosamax Continue to hold lisinopril because of recent episodes of hypotension Vertigo chronic persistent recurrent status post extensive workup Continue Flomax therapy as per urology Have the patient follow-up with Dr. Nehemiah Massed on Monday  David [name] 08/09/2016, 3:03 PM

## 2016-08-09 NOTE — Discharge Instructions (Signed)
Go to Dr Philemon Kingdom office on Monday for Holter placement

## 2016-08-09 NOTE — Progress Notes (Signed)
Stood patient up and he was a little dizzy. We waited at the bedside for a few minutes. Ambulated patient around the unit 151ft using rolling walker, HR in the 70s. Patient tolerated ambulation well.

## 2016-08-09 NOTE — Progress Notes (Signed)
Patient discharged per MD order and hospital protocol. Patient and daughter in law verbalized understanding of follow up appointments and discharge instructions.  Medications did not change. Patient was escorted out via wheelchair by volunteer.

## 2017-05-13 ENCOUNTER — Other Ambulatory Visit (INDEPENDENT_AMBULATORY_CARE_PROVIDER_SITE_OTHER): Payer: Self-pay | Admitting: Vascular Surgery

## 2017-05-13 DIAGNOSIS — I739 Peripheral vascular disease, unspecified: Principal | ICD-10-CM

## 2017-05-13 DIAGNOSIS — I779 Disorder of arteries and arterioles, unspecified: Secondary | ICD-10-CM

## 2017-05-16 ENCOUNTER — Encounter (INDEPENDENT_AMBULATORY_CARE_PROVIDER_SITE_OTHER): Payer: Self-pay

## 2017-05-16 ENCOUNTER — Ambulatory Visit (INDEPENDENT_AMBULATORY_CARE_PROVIDER_SITE_OTHER): Payer: Self-pay | Admitting: Vascular Surgery

## 2017-06-11 ENCOUNTER — Ambulatory Visit (INDEPENDENT_AMBULATORY_CARE_PROVIDER_SITE_OTHER): Payer: Medicare Other | Admitting: Vascular Surgery

## 2017-06-11 ENCOUNTER — Encounter (INDEPENDENT_AMBULATORY_CARE_PROVIDER_SITE_OTHER): Payer: Self-pay | Admitting: Vascular Surgery

## 2017-06-11 ENCOUNTER — Ambulatory Visit (INDEPENDENT_AMBULATORY_CARE_PROVIDER_SITE_OTHER): Payer: Medicare Other

## 2017-06-11 VITALS — BP 162/60 | HR 56 | Resp 15 | Ht 73.0 in | Wt 172.0 lb

## 2017-06-11 DIAGNOSIS — I779 Disorder of arteries and arterioles, unspecified: Secondary | ICD-10-CM | POA: Diagnosis not present

## 2017-06-11 DIAGNOSIS — R42 Dizziness and giddiness: Secondary | ICD-10-CM | POA: Diagnosis not present

## 2017-06-11 DIAGNOSIS — I739 Peripheral vascular disease, unspecified: Principal | ICD-10-CM

## 2017-06-11 DIAGNOSIS — R55 Syncope and collapse: Secondary | ICD-10-CM

## 2017-06-11 DIAGNOSIS — I6523 Occlusion and stenosis of bilateral carotid arteries: Secondary | ICD-10-CM

## 2017-06-11 LAB — VAS US CAROTID
LCCADDIAS: -14 cm/s
LEFT ECA DIAS: -3 cm/s
LEFT VERTEBRAL DIAS: 14 cm/s
LICAPDIAS: -16 cm/s
Left CCA dist sys: -112 cm/s
Left CCA prox dias: 14 cm/s
Left CCA prox sys: 140 cm/s
Left ICA dist dias: -20 cm/s
Left ICA dist sys: -90 cm/s
Left ICA prox sys: -74 cm/s
RCCADSYS: -80 cm/s
RCCAPDIAS: -17 cm/s
RIGHT CCA MID DIAS: 13 cm/s
RIGHT ECA DIAS: 5 cm/s
RIGHT VERTEBRAL DIAS: -9 cm/s
Right CCA prox sys: -116 cm/s

## 2017-06-11 NOTE — Progress Notes (Signed)
Subjective:    Patient ID: David [name], male    DOB: 11-Jan-1927, 83 y.o.   MRN: [id number] Chief Complaint  Patient presents with  . Carotid    1 year carotid u/s   Patient presents for a yearly carotid artery stenosis follow-up. He presents without complaint. He denies any amaurosis fugax or neurological symptoms. The patient underwent a carotid artery duplex exam which was notable for a patent right carotid artery endarterectomy without evidence of restenosis. Doppler velocities suggest 1-39% stenosis of the right internal carotid artery. Left patent carotid artery endarterectomy without evidence of restenosis. Doppler velocities suggest 1-39% stenosis of the left internal carotid artery. When compared to the prior study on 05/14/2016 there has been no significant change. She denies any fever, nausea or vomiting.   Review of Systems  Constitutional: Negative.   HENT: Negative.   Eyes: Negative.   Respiratory: Negative.   Cardiovascular: Negative.   Gastrointestinal: Negative.   Endocrine: Negative.   Genitourinary: Negative.   Musculoskeletal: Negative.   Skin: Negative.   Allergic/Immunologic: Negative.   Neurological: Negative.   Hematological: Negative.   Psychiatric/Behavioral: Negative.       Objective:   Physical Exam  Constitutional: He is oriented to person, place, and time. He appears well-developed and well-nourished. No distress.  HENT:  Head: Normocephalic and atraumatic.  Eyes: Pupils are equal, round, and reactive to light. Conjunctivae are normal.  Neck: Normal range of motion.  No carotid bruits noted on exam  Cardiovascular: Normal rate, regular rhythm, normal heart sounds and intact distal pulses.   Pulses:      Radial pulses are 2+ on the right side, and 2+ on the left side.  Pulmonary/Chest: Effort normal.  Musculoskeletal: Normal range of motion. He exhibits no edema.  Neurological: He is alert and oriented to person, place, and time.  Skin: Skin  is warm and dry. He is not diaphoretic.  Psychiatric: He has a normal mood and affect. His behavior is normal. Judgment and thought content normal.  Vitals reviewed.  BP (!) 162/60 (BP Location: Right Arm)   Pulse (!) 56   Resp 15   Ht 6\' 1"  (1.854 m)   Wt 172 lb (78 kg)   BMI 22.69 kg/m   Past Medical History:  Diagnosis Date  . Anxiety   . Arthritis    Rheumatoid  . Dementia    Dx in May  . Hypertension   . Stroke [facility])    Social History   Social History  . Marital status: Married    Spouse name: N/A  . Number of children: N/A  . Years of education: N/A   Occupational History  . Not on file.   Social History Main Topics  . Smoking status: Former Smoker    Types: Cigarettes  . Smokeless tobacco: Never Used  . Alcohol use No  . Drug use: Unknown  . Sexual activity: Not on file   Other Topics Concern  . Not on file   Social History Narrative  . No narrative on file   Past Surgical History:  Procedure Laterality Date  . 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 SPINE SURGERY  2014   "ruptured disc"    Family History  Problem Relation Age of Onset  . Hypertension Mother    No Known Allergies     Assessment & Plan:  Patient presents for a yearly carotid artery stenosis follow-up. He presents without complaint. He denies any amaurosis  fugax or neurological symptoms. The patient underwent a carotid artery duplex exam which was notable for a patent right carotid artery endarterectomy without evidence of restenosis. Doppler velocities suggest 1-39% stenosis of the right internal carotid artery. Left patent carotid artery endarterectomy without evidence of restenosis. Doppler velocities suggest 1-39% stenosis of the left internal carotid artery. When compared to the prior study on 05/14/2016 there has been no significant change. She denies any fever, nausea or vomiting.  1. Bilateral carotid artery stenosis - Stable Studies reviewed with  patient. Patient asymptomatic with stable duplex. No intervention at this time. Patient to return in one year for surveillance carotid duplex. Patient to remain abstinent of tobacco use. I have discussed with the patient at length the risk factors for and pathogenesis of atherosclerotic disease and encouraged a healthy diet, regular exercise regimen and blood pressure / glucose control.  Patient was instructed to contact our office in the interim with problems such as arm / leg weakness or numbness, speech / swallowing difficulty or temporary monocular blindness. The patient expresses their understanding.  - VAS US CAROTID; Future  2. Dizziness - Stable Asymptomatic at this time  3. Syncope, unspecified syncope type - Stable Asymptomatic at this time  Current Outpatient Prescriptions on File Prior to Visit  Medication Sig Dispense Refill  . donepezil (ARICEPT) 5 MG tablet Take 5 mg by mouth at bedtime.    . gabapentin (NEURONTIN) 100 MG capsule Take 100 mg by mouth 2 (two) times daily.    . hydrALAZINE (APRESOLINE) 10 MG tablet Take 10 mg by mouth daily as needed.    . latanoprost (XALATAN) 0.005 % ophthalmic solution Place 1 drop into both eyes.     Marland Kitchen lisinopril (PRINIVIL,ZESTRIL) 5 MG tablet Take 5 mg by mouth daily.    . tamsulosin (FLOMAX) 0.4 MG CAPS capsule Take 0.4 mg by mouth.    . timolol (TIMOPTIC) 0.5 % ophthalmic solution Place 1 drop into both eyes.      No current facility-administered medications on file prior to visit.    There are no Patient Instructions on file for this visit. No Follow-up on file.  Taina Landry A Scharlene Catalina, PA-C

## 2018-06-12 ENCOUNTER — Encounter (INDEPENDENT_AMBULATORY_CARE_PROVIDER_SITE_OTHER): Payer: Medicare Other

## 2018-06-12 ENCOUNTER — Ambulatory Visit (INDEPENDENT_AMBULATORY_CARE_PROVIDER_SITE_OTHER): Payer: Medicare Other | Admitting: Vascular Surgery

## 2020-09-06 ENCOUNTER — Emergency Department: Payer: Medicare Other

## 2020-09-06 ENCOUNTER — Other Ambulatory Visit: Payer: Self-pay

## 2020-09-06 ENCOUNTER — Inpatient Hospital Stay
Admission: EM | Admit: 2020-09-06 | Discharge: 2020-09-12 | DRG: 177 | Disposition: A | Discharge status: Skilled Nursing Facility | Payer: Medicare Other | Attending: Internal Medicine | Admitting: Internal Medicine

## 2020-09-06 DIAGNOSIS — Z66 Do not resuscitate: Secondary | ICD-10-CM | POA: Diagnosis present

## 2020-09-06 DIAGNOSIS — F419 Anxiety disorder, unspecified: Secondary | ICD-10-CM | POA: Diagnosis present

## 2020-09-06 DIAGNOSIS — N400 Enlarged prostate without lower urinary tract symptoms: Secondary | ICD-10-CM | POA: Diagnosis present

## 2020-09-06 DIAGNOSIS — Z79899 Other long term (current) drug therapy: Secondary | ICD-10-CM

## 2020-09-06 DIAGNOSIS — Z7982 Long term (current) use of aspirin: Secondary | ICD-10-CM

## 2020-09-06 DIAGNOSIS — Z87891 Personal history of nicotine dependence: Secondary | ICD-10-CM | POA: Diagnosis not present

## 2020-09-06 DIAGNOSIS — J1282 Pneumonia due to coronavirus disease 2019: Secondary | ICD-10-CM | POA: Diagnosis present

## 2020-09-06 DIAGNOSIS — M1990 Unspecified osteoarthritis, unspecified site: Secondary | ICD-10-CM | POA: Diagnosis present

## 2020-09-06 DIAGNOSIS — Z8673 Personal history of transient ischemic attack (TIA), and cerebral infarction without residual deficits: Secondary | ICD-10-CM | POA: Diagnosis not present

## 2020-09-06 DIAGNOSIS — K5900 Constipation, unspecified: Secondary | ICD-10-CM | POA: Diagnosis not present

## 2020-09-06 DIAGNOSIS — N1831 Chronic kidney disease, stage 3a: Secondary | ICD-10-CM | POA: Diagnosis present

## 2020-09-06 DIAGNOSIS — J9601 Acute respiratory failure with hypoxia: Secondary | ICD-10-CM | POA: Diagnosis present

## 2020-09-06 DIAGNOSIS — E870 Hyperosmolality and hypernatremia: Secondary | ICD-10-CM | POA: Diagnosis present

## 2020-09-06 DIAGNOSIS — Z8249 Family history of ischemic heart disease and other diseases of the circulatory system: Secondary | ICD-10-CM

## 2020-09-06 DIAGNOSIS — I129 Hypertensive chronic kidney disease with stage 1 through stage 4 chronic kidney disease, or unspecified chronic kidney disease: Secondary | ICD-10-CM | POA: Diagnosis present

## 2020-09-06 DIAGNOSIS — E860 Dehydration: Secondary | ICD-10-CM | POA: Diagnosis present

## 2020-09-06 DIAGNOSIS — U071 COVID-19: Secondary | ICD-10-CM | POA: Diagnosis present

## 2020-09-06 DIAGNOSIS — F0390 Unspecified dementia without behavioral disturbance: Secondary | ICD-10-CM | POA: Diagnosis present

## 2020-09-06 LAB — URINALYSIS, COMPLETE (UACMP) WITH MICROSCOPIC
Bacteria, UA: NONE SEEN
Bilirubin Urine: NEGATIVE
Glucose, UA: NEGATIVE mg/dL
Ketones, ur: NEGATIVE mg/dL
Leukocytes,Ua: NEGATIVE
Nitrite: NEGATIVE
Protein, ur: 100 mg/dL — AB
Specific Gravity, Urine: 1.024 (ref 1.005–1.030)
pH: 5 (ref 5.0–8.0)

## 2020-09-06 LAB — CBC WITH DIFFERENTIAL/PLATELET
Abs Immature Granulocytes: 0.09 10*3/uL — ABNORMAL HIGH (ref 0.00–0.07)
Basophils Absolute: 0 10*3/uL (ref 0.0–0.1)
Basophils Relative: 0 %
Eosinophils Absolute: 0 10*3/uL (ref 0.0–0.5)
Eosinophils Relative: 0 %
HCT: 39.9 % (ref 39.0–52.0)
Hemoglobin: 12.5 g/dL — ABNORMAL LOW (ref 13.0–17.0)
Immature Granulocytes: 1 %
Lymphocytes Relative: 2 %
Lymphs Abs: 0.4 10*3/uL — ABNORMAL LOW (ref 0.7–4.0)
MCH: 26.9 pg (ref 26.0–34.0)
MCHC: 31.3 g/dL (ref 30.0–36.0)
MCV: 86 fL (ref 80.0–100.0)
Monocytes Absolute: 0.7 10*3/uL (ref 0.1–1.0)
Monocytes Relative: 4 %
Neutro Abs: 15.5 10*3/uL — ABNORMAL HIGH (ref 1.7–7.7)
Neutrophils Relative %: 93 %
Platelets: 196 10*3/uL (ref 150–400)
RBC: 4.64 MIL/uL (ref 4.22–5.81)
RDW: 15.7 % — ABNORMAL HIGH (ref 11.5–15.5)
WBC: 16.7 10*3/uL — ABNORMAL HIGH (ref 4.0–10.5)
nRBC: 0 % (ref 0.0–0.2)

## 2020-09-06 LAB — COMPREHENSIVE METABOLIC PANEL
ALT: 12 U/L (ref 0–44)
AST: 23 U/L (ref 15–41)
Albumin: 2.7 g/dL — ABNORMAL LOW (ref 3.5–5.0)
Alkaline Phosphatase: 42 U/L (ref 38–126)
Anion gap: 13 (ref 5–15)
BUN: 47 mg/dL — ABNORMAL HIGH (ref 8–23)
CO2: 26 mmol/L (ref 22–32)
Calcium: 9.2 mg/dL (ref 8.9–10.3)
Chloride: 108 mmol/L (ref 98–111)
Creatinine, Ser: 1.58 mg/dL — ABNORMAL HIGH (ref 0.61–1.24)
GFR, Estimated: 41 mL/min — ABNORMAL LOW (ref >60)
Glucose, Bld: 142 mg/dL — ABNORMAL HIGH (ref 70–99)
Potassium: 4.4 mmol/L (ref 3.5–5.1)
Sodium: 147 mmol/L — ABNORMAL HIGH (ref 135–145)
Total Bilirubin: 1 mg/dL (ref 0.3–1.2)
Total Protein: 7.3 g/dL (ref 6.5–8.1)

## 2020-09-06 LAB — FIBRIN DERIVATIVES D-DIMER (ARMC ONLY): Fibrin derivatives D-dimer (ARMC): 2359.76 ng/mL (FEU) — ABNORMAL HIGH (ref 0.00–499.00)

## 2020-09-06 LAB — GLUCOSE, CAPILLARY: Glucose-Capillary: 208 mg/dL — ABNORMAL HIGH (ref 70–99)

## 2020-09-06 LAB — PROCALCITONIN
Procalcitonin: 0.74 ng/mL
Procalcitonin: 0.97 ng/mL

## 2020-09-06 LAB — C-REACTIVE PROTEIN: CRP: 29.4 mg/dL — ABNORMAL HIGH (ref <1.0)

## 2020-09-06 LAB — FERRITIN: Ferritin: 237 ng/mL (ref 24–336)

## 2020-09-06 LAB — LACTATE DEHYDROGENASE: LDH: 237 U/L — ABNORMAL HIGH (ref 98–192)

## 2020-09-06 LAB — POC SARS CORONAVIRUS 2 AG -  ED: SARS Coronavirus 2 Ag: POSITIVE — AB

## 2020-09-06 MED ORDER — LISINOPRIL 5 MG PO TABS
5.0000 mg | ORAL_TABLET | Freq: Every day | ORAL | Status: DC
  Administered 2020-09-06 – 2020-09-12 (×7): 5 mg via ORAL
  Filled 2020-09-06 (×7): qty 1

## 2020-09-06 MED ORDER — LACTATED RINGERS IV BOLUS
1000.0000 mL | Freq: Once | INTRAVENOUS | Status: AC
  Administered 2020-09-06: 1000 mL via INTRAVENOUS

## 2020-09-06 MED ORDER — ACETAMINOPHEN 325 MG PO TABS
650.0000 mg | ORAL_TABLET | Freq: Four times a day (QID) | ORAL | Status: DC | PRN
  Administered 2020-09-07: 22:00:00 650 mg via ORAL
  Filled 2020-09-06: qty 2

## 2020-09-06 MED ORDER — ONDANSETRON HCL 4 MG/2ML IJ SOLN: 4.0000 mg | Freq: Four times a day (QID) | INTRAMUSCULAR | Status: DC | PRN

## 2020-09-06 MED ORDER — ASPIRIN EC 81 MG PO TBEC
81.0000 mg | DELAYED_RELEASE_TABLET | Freq: Every day | ORAL | Status: DC
  Administered 2020-09-06 – 2020-09-12 (×7): 81 mg via ORAL
  Filled 2020-09-06 (×7): qty 1

## 2020-09-06 MED ORDER — SODIUM CHLORIDE 0.9 % IV SOLN
200.0000 mg | Freq: Once | INTRAVENOUS | Status: AC
  Administered 2020-09-06: 200 mg via INTRAVENOUS
  Filled 2020-09-06: qty 200

## 2020-09-06 MED ORDER — ORAL CARE MOUTH RINSE
15.0000 mL | Freq: Two times a day (BID) | OROMUCOSAL | Status: DC
  Administered 2020-09-06 – 2020-09-12 (×12): 15 mL via OROMUCOSAL

## 2020-09-06 MED ORDER — SODIUM CHLORIDE 0.9 % IV SOLN
100.0000 mg | Freq: Every day | INTRAVENOUS | Status: AC
  Administered 2020-09-07 – 2020-09-10 (×4): 100 mg via INTRAVENOUS
  Filled 2020-09-06 (×4): qty 20

## 2020-09-06 MED ORDER — ONDANSETRON HCL 4 MG PO TABS: 4.0000 mg | ORAL_TABLET | Freq: Four times a day (QID) | ORAL | Status: DC | PRN

## 2020-09-06 MED ORDER — DONEPEZIL HCL 5 MG PO TABS
5.0000 mg | ORAL_TABLET | Freq: Every day | ORAL | Status: DC
  Administered 2020-09-06 – 2020-09-11 (×6): 5 mg via ORAL
  Filled 2020-09-06 (×6): qty 1

## 2020-09-06 MED ORDER — TAMSULOSIN HCL 0.4 MG PO CAPS
0.4000 mg | ORAL_CAPSULE | Freq: Every day | ORAL | Status: DC
  Administered 2020-09-06 – 2020-09-11 (×5): 0.4 mg via ORAL
  Filled 2020-09-06 (×6): qty 1

## 2020-09-06 MED ORDER — DEXAMETHASONE SODIUM PHOSPHATE 10 MG/ML IJ SOLN
10.0000 mg | Freq: Once | INTRAMUSCULAR | Status: AC
  Administered 2020-09-06: 10 mg via INTRAVENOUS
  Filled 2020-09-06: qty 1

## 2020-09-06 MED ORDER — ENOXAPARIN SODIUM 40 MG/0.4ML ~~LOC~~ SOLN
40.0000 mg | SUBCUTANEOUS | Status: DC
  Administered 2020-09-06 – 2020-09-11 (×6): 40 mg via SUBCUTANEOUS
  Filled 2020-09-06 (×6): qty 0.4

## 2020-09-06 NOTE — ED Triage Notes (Signed)
Per ems cough, low oxygen sats 88% RA, 1st vaccine, more confusion , body aches

## 2020-09-06 NOTE — Consult Note (Signed)
Remdesivir - Pharmacy Brief Note   O:  ALT: CMP pending CXR: "Bilateral airspace opacities compatible with multifocal infection" SpO2: Hypoxic requiring supplemental oxygen   A/P:  09/06/20 SARS-CoV-2 Ag (+)  Remdesivir 200 mg IVPB once followed by 100 mg IVPB daily x 4 days.   Tressie Ellis 09/06/2020 12:19 PM

## 2020-09-06 NOTE — ED Provider Notes (Signed)
[facility] Emergency Department Provider Note  ____________________________________________  Time seen: Approximately 12:17 PM  I have reviewed the triage vital signs and the nursing notes.   HISTORY  Chief Complaint Altered Mental Status (Past 3 days flu like symptoms, cough, confusion , low oxygen sat per ems 88%, 1st covid vaccine, )    Level 5 Caveat: Portions of the History and Physical including HPI and review of systems are unable to be completely obtained due to patient being a poor historian   HPI [name] is a 87 y.o. male with a history of dementia, hypertension, previous stroke who was brought to the ED due to fatigue, decreased energy level, cough, confusion, body aches for the last 2 days.  Gradual onset, constant, no aggravating or alleviating factors.  No known sick contacts.  No vomiting or diarrhea.  There has been decreased oral intake.      Past Medical History:  Diagnosis Date  . Anxiety   . Arthritis    Rheumatoid  . Dementia (HCC)    Dx in May  . Hypertension   . Stroke [facility])      Patient Active Problem List   Diagnosis Date Noted  . Pneumonia due to COVID-19 virus 09/06/2020  . Acute hypoxemic respiratory failure (HCC) 09/06/2020  . Bilateral carotid artery stenosis 06/11/2017  . Dizziness 08/08/2016  . Syncopal episodes 08/08/2016     Past Surgical History:  Procedure Laterality Date  . 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 SPINE SURGERY  2014   "ruptured disc"      Prior to Admission medications   Medication Sig Start Date End Date Taking? Authorizing Provider  aspirin EC 81 MG tablet Take by mouth. 09/25/09   [provider]  divalproex (DEPAKOTE) 125 MG DR tablet Take 2 tablet in the morning and 1 tablets at night 10/01/16   [provider]  donepezil (ARICEPT) 5 MG tablet Take 5 mg by mouth at bedtime.    [provider]  gabapentin (NEURONTIN) 100 MG capsule Take  100 mg by mouth 2 (two) times daily.    [provider]  hydrALAZINE (APRESOLINE) 10 MG tablet Take 10 mg by mouth daily as needed. 06/19/16 06/19/17  [provider]  latanoprost (XALATAN) 0.005 % ophthalmic solution Place 1 drop into both eyes.  08/03/16   [provider]  lisinopril (PRINIVIL,ZESTRIL) 5 MG tablet Take 5 mg by mouth daily.    [provider]  Sennosides 25 MG TABS Take by mouth.    [provider]  tamsulosin (FLOMAX) 0.4 MG CAPS capsule Take 0.4 mg by mouth.    [provider]  timolol (TIMOPTIC) 0.5 % ophthalmic solution Place 1 drop into both eyes.  05/16/16   [provider]     Allergies Patient has no known allergies.   Family History  Problem Relation Age of Onset  . Hypertension Mother     Social History Social History   Tobacco Use  . Smoking status: Former Smoker    Types: Cigarettes  . Smokeless tobacco: Never Used  Substance Use Topics  . Alcohol use: No    Review of Systems Level 5 Caveat: Portions of the History and Physical including HPI and review of systems are unable to be completely obtained due to patient being a poor historian   Constitutional:   No known fever.  ENT:   No rhinorrhea. Cardiovascular:   No chest pain or syncope. Respiratory:  Positive shortness of breath and cough. Gastrointestinal:   Negative for abdominal pain, vomiting and diarrhea.  Musculoskeletal:   Negative for focal pain or swelling ____________________________________________   PHYSICAL EXAM:  VITAL SIGNS: ED Triage Vitals  Enc Vitals Group     BP 09/06/20 1123 (!) 161/54     Pulse Rate 09/06/20 1123 74     Resp 09/06/20 1123 17     Temp 09/06/20 1123 98.3 F (36.8 C)     Temp Source 09/06/20 1123 Oral     SpO2 09/06/20 1123 98 %     Weight 09/06/20 1124 154 lb 5.2 oz (70 kg)     Height 09/06/20 1124 5\' 9"  (1.753 m)     Head Circumference --      Peak Flow --      Pain Score 09/06/20  1124 0     Pain Loc --      Pain Edu? --      Excl. in GC? --     Vital signs reviewed, nursing assessments reviewed.   Constitutional:   Alert and oriented to person and place. Non-toxic appearance. Eyes:   Conjunctivae are normal. EOMI. PERRL. ENT      Head:   Normocephalic and atraumatic.      Nose:   No congestion/rhinnorhea.       Mouth/Throat:   Dry mucous membranes, no pharyngeal erythema. No peritonsillar mass.       Neck:   No meningismus. Full ROM. Hematological/Lymphatic/Immunilogical:   No cervical lymphadenopathy. Cardiovascular:   RRR. Symmetric bilateral radial and DP pulses.  No murmurs. Cap refill less than 2 seconds. Respiratory:   Normal respiratory effort without tachypnea/retractions.  Bilateral basilar crackles.. Gastrointestinal:   Soft and nontender. Non distended. There is no CVA tenderness.  No rebound, rigidity, or guarding.  Musculoskeletal:   Normal range of motion in all extremities. No joint effusions.  No lower extremity tenderness.  No edema. Neurologic:   Minimal speech Motor grossly intact. No acute focal neurologic deficits are appreciated.  Skin:    Skin is warm, dry and intact. No rash noted.  No petechiae, purpura, or bullae.  ____________________________________________    LABS (pertinent positives/negatives) (all labs ordered are listed, but only abnormal results are displayed) Labs Reviewed  COMPREHENSIVE METABOLIC PANEL - Abnormal; Notable for the following components:      Result Value   Sodium 147 (*)    Glucose, Bld 142 (*)    BUN 47 (*)    Creatinine, Ser 1.58 (*)    Albumin 2.7 (*)    GFR, Estimated 41 (*)    All other components within normal limits  CBC WITH DIFFERENTIAL/PLATELET - Abnormal; Notable for the following components:   WBC 16.7 (*)    Hemoglobin 12.5 (*)    RDW 15.7 (*)    Neutro Abs 15.5 (*)    Lymphs Abs 0.4 (*)    Abs Immature Granulocytes 0.09 (*)    All other components within normal limits  POC SARS  CORONAVIRUS 2 AG -  ED - Abnormal; Notable for the following components:   SARS Coronavirus 2 Ag Positive (*)    All other components within normal limits  PROCALCITONIN  URINALYSIS, COMPLETE (UACMP) WITH MICROSCOPIC   ____________________________________________   EKG  Interpreted by me Sinus rhythm rate of 68, normal axis and intervals.  Normal QRS ST segments and T waves.  ____________________________________________    RADIOLOGY  DG Chest Portable 1 View  Result Date: 09/06/2020 CLINICAL DATA:  Hypoxia.  Weakness. EXAM: PORTABLE CHEST 1 VIEW COMPARISON:  08/08/2016 FINDINGS: Normal heart size. Aortic atherosclerosis. Bilateral airspace opacities are identified, most notable within the right upper lobe and left lower lobe. No pleural effusion or edema. IMPRESSION: Bilateral airspace opacities compatible with multifocal infection. Followup PA and lateral chest X-ray is recommended in 3-4 weeks following trial of antibiotic therapy to ensure resolution and exclude underlying malignancy. Electronically Signed   By: Signa Kell M.D.   On: 09/06/2020 12:01    ____________________________________________   PROCEDURES .Critical Care Performed by: Sharman Cheek, MD Authorized by: Sharman Cheek, MD   Critical care provider statement:    Critical care time (minutes):  33   Critical care time was exclusive of:  Separately billable procedures and treating other patients   Critical care was necessary to treat or prevent imminent or life-threatening deterioration of the following conditions:  Respiratory failure   Critical care was time spent personally by me on the following activities:  Development of treatment plan with patient or surrogate, discussions with consultants, evaluation of patient's response to treatment, examination of patient, obtaining history from patient or surrogate, ordering and performing treatments and interventions, ordering and review of laboratory  studies, ordering and review of radiographic studies, pulse oximetry, re-evaluation of patient's condition and review of old charts    ____________________________________________    CLINICAL IMPRESSION / ASSESSMENT AND PLAN / ED COURSE  Medications ordered in the ED: Medications  remdesivir 200 mg in sodium chloride 0.9% 250 mL IVPB (0 mg Intravenous Stopped 09/06/20 1420)    Followed by  remdesivir 100 mg in sodium chloride 0.9 % 100 mL IVPB (has no administration in time range)  lactated ringers bolus 1,000 mL (0 mLs Intravenous Stopped 09/06/20 1225)  dexamethasone (DECADRON) injection 10 mg (10 mg Intravenous Given 09/06/20 1224)    Pertinent labs & imaging results that were available during my care of the patient were reviewed by me and considered in my medical decision making (see chart for details).   David [name] was evaluated in Emergency Department on 09/06/2020 for the symptoms described in the history of present illness. He was evaluated in the context of the global COVID-19 pandemic, which necessitated consideration that the patient might be at risk for infection with the SARS-CoV-2 virus that causes COVID-19. Institutional protocols and algorithms that pertain to the evaluation of patients at risk for COVID-19 are in a state of rapid change based on information released by regulatory bodies including the CDC and federal and state organizations. These policies and algorithms were followed during the patient's care in the ED.   Patient presents with shortness of breath cough, found to be hypoxic to 88% on room air.  Multiple symptoms suggestive of viral illness/COVID.  Chest x-ray viewed and interpreted by me which does shows bilateral infiltrates.  COVID rapid antigen is positive.  Patient is afebrile, no leukocytosis, doubt superimposed bacterial infection.  He is not septic.  Decadron and remdesivir ordered.  Family updated.  Case discussed with hospitalist for further  management.      ____________________________________________   FINAL CLINICAL IMPRESSION(S) / ED DIAGNOSES    Final diagnoses:  Pneumonia due to COVID-19 virus  Acute respiratory failure with hypoxia (HCC)  Chronic dementia without behavioral disturbance [facility])     ED Discharge Orders    None      Portions of this note were generated with dragon dictation software. Dictation errors may occur despite best attempts at proofreading.   Sharman Cheek,  MD 09/06/20 1427

## 2020-09-06 NOTE — H&P (Signed)
History and Physical:    David [name]   IHW:[id number] DOB: 1926/08/30 DOA: 09/06/2020  Referring MD/provider: Alfonse Flavors, MD PCP: [facility]   Patient coming from: Home  Chief Complaint: Generalized weakness  History of Present Illness:   David [name] is a 87 y.o. male with medical history significant for anxiety, arthritis, dementia, hypertension, stroke, CKD stage III a, recent right inguinal hernia repair November 2021.  He was brought to the hospital because of generalized weakness.  History was mainly obtained from his daughter-in-law who was at the bedside.  His daughter-in-law said they live close to his home and they check on him from time to time.  She went to check on him this morning and she found him in a poor condition.  She said patient was very weak and a little confused.  He had urinated on himself was very dark in color.  Reportedly, patient has also been coughing for the past 2 days.  EMS was called and apparently when EMS picked him up, his oxygen saturation was low at 88% on room air.  ED Course:  The patient tested positive for COVID-19 infection and chest x-ray showed multifocal pneumonia.  ROS:   ROS all other systems reviewed were negative  Past Medical History:   Past Medical History:  Diagnosis Date  . Anxiety   . Arthritis    Rheumatoid  . Dementia (HCC)    Dx in May  . Hypertension   . Stroke [facility])     Past Surgical History:   Past Surgical History:  Procedure Laterality Date  . 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 SPINE SURGERY  2014   "ruptured disc"     Social History:   Social History   Socioeconomic History  . Marital status: Married    Spouse name: Not on file  . Number of children: Not on file  . Years of education: Not on file  . Highest education level: Not on file  Occupational History  . Not on file  Tobacco Use  . Smoking status: Former Smoker    Types: Cigarettes  . Smokeless  tobacco: Never Used  Substance and Sexual Activity  . Alcohol use: No  . Drug use: Not on file  . Sexual activity: Not on file  Other Topics Concern  . Not on file  Social History Narrative  . Not on file   Social Determinants of Health   Financial Resource Strain: Not on file  Food Insecurity: Not on file  Transportation Needs: Not on file  Physical Activity: Not on file  Stress: Not on file  Social Connections: Not on file  Intimate Partner Violence: Not on file    Allergies   Patient has no known allergies.  Family history:   Family History  Problem Relation Age of Onset  . Hypertension Mother     Current Medications:   Prior to Admission medications   Medication Sig Start Date End Date Taking? Authorizing Provider  aspirin EC 81 MG tablet Take by mouth. 09/25/09   [provider]  divalproex (DEPAKOTE) 125 MG DR tablet Take 2 tablet in the morning and 1 tablets at night 10/01/16   [provider]  donepezil (ARICEPT) 5 MG tablet Take 5 mg by mouth at bedtime.    [provider]  gabapentin (NEURONTIN) 100 MG capsule Take 100 mg by mouth 2 (two) times daily.    [provider]  hydrALAZINE (  APRESOLINE) 10 MG tablet Take 10 mg by mouth daily as needed. 06/19/16 06/19/17  [provider]  latanoprost (XALATAN) 0.005 % ophthalmic solution Place 1 drop into both eyes.  08/03/16   [provider]  lisinopril (PRINIVIL,ZESTRIL) 5 MG tablet Take 5 mg by mouth daily.    [provider]  Sennosides 25 MG TABS Take by mouth.    [provider]  tamsulosin (FLOMAX) 0.4 MG CAPS capsule Take 0.4 mg by mouth.    [provider]  timolol (TIMOPTIC) 0.5 % ophthalmic solution Place 1 drop into both eyes.  05/16/16   [provider]    Physical Exam:   Vitals:   09/06/20 1123 09/06/20 1124 09/06/20 1226  BP: (!) 161/54  (!) 150/74  Pulse: 74  77  Resp: 17  (!) 23  Temp: 98.3 F (36.8 C)  98.8  F (37.1 C)  TempSrc: Oral  Oral  SpO2: 98%  95%  Weight:  70 kg   Height:  5\' 9"  (1.753 m)      Physical Exam: Blood pressure (!) 150/74, pulse 77, temperature 98.8 F (37.1 C), temperature source Oral, resp. rate (!) 23, height 5\' 9"  (1.753 m), weight 70 kg, SpO2 95 %. Gen: No acute distress. Head: Normocephalic, atraumatic. Eyes: Pupils equal, round and reactive to light. Extraocular movements intact.  Sclerae nonicteric.  Mouth: Dry mucous membranes Neck: Supple, no thyromegaly, no lymphadenopathy, no jugular venous distention. Chest: Decreased air entry bilaterally.  No rales, rhonchi or wheezes.  CV: Heart sounds are regular with an S1, S2. No murmurs, rubs or gallops.  Abdomen: Soft, nontender, nondistended with normal active bowel sounds. No palpable masses. Extremities: Extremities are without clubbing, or cyanosis. No edema. Pedal pulses 2+.  Skin: Warm and dry.  Poor skin turgor.  Surgical scar right inguinal region has healed nicely.   Neuro: Alert and oriented times 3; grossly nonfocal.  Psych: Insight is good and judgment is appropriate. Mood and affect normal.   Data Review:    Labs: Basic Metabolic Panel: Recent Labs  Lab 09/06/20 1136  NA 147*  K 4.4  CL 108  CO2 26  GLUCOSE 142*  BUN 47*  CREATININE 1.58*  CALCIUM 9.2   Liver Function Tests: Recent Labs  Lab 09/06/20 1136  AST 23  ALT 12  ALKPHOS 42  BILITOT 1.0  PROT 7.3  ALBUMIN 2.7*   No results for input(s): LIPASE, AMYLASE in the last 168 hours. No results for input(s): AMMONIA in the last 168 hours. CBC: Recent Labs  Lab 09/06/20 1136  WBC 16.7*  NEUTROABS 15.5*  HGB 12.5*  HCT 39.9  MCV 86.0  PLT 196   Cardiac Enzymes: No results for input(s): CKTOTAL, CKMB, CKMBINDEX, TROPONINI in the last 168 hours.  BNP (last 3 results) No results for input(s): PROBNP in the last 8760 hours. CBG: No results for input(s): GLUCAP in the last 168 hours.  Urinalysis    Component  Value Date/Time   COLORURINE Yellow 09/26/2013 1256   APPEARANCEUR Clear 09/26/2013 1256   LABSPEC 1.015 09/26/2013 1256   PHURINE 6.0 09/26/2013 1256   GLUCOSEU Negative 09/26/2013 1256   HGBUR Negative 09/26/2013 1256   BILIRUBINUR Negative 09/26/2013 1256   KETONESUR Trace 09/26/2013 1256   PROTEINUR Negative 09/26/2013 1256   NITRITE Negative 09/26/2013 1256   LEUKOCYTESUR Negative 09/26/2013 1256      Radiographic Studies: DG Chest Portable 1 View  Result Date: 09/06/2020 CLINICAL DATA:  Hypoxia.  Weakness. EXAM:  PORTABLE CHEST 1 VIEW COMPARISON:  08/08/2016 FINDINGS: Normal heart size. Aortic atherosclerosis. Bilateral airspace opacities are identified, most notable within the right upper lobe and left lower lobe. No pleural effusion or edema. IMPRESSION: Bilateral airspace opacities compatible with multifocal infection. Followup PA and lateral chest X-ray is recommended in 3-4 weeks following trial of antibiotic therapy to ensure resolution and exclude underlying malignancy. Electronically Signed   By: Signa Kell M.D.   On: 09/06/2020 12:01      Assessment/Plan:   Principal Problem:   Pneumonia due to COVID-19 virus Active Problems:   Acute hypoxemic respiratory failure (HCC)    Body mass index is 22.79 kg/m.    COVID-19 pneumonia: Admit to MedSurg.  Monitor on telemetry.  Treat with IV remdesivir and IV steroids.  Acute hypoxic respiratory failure: Continue oxygen 2 L/min oxygen via nasal cannula.  Hypernatremia/dehydration: He received 2 L of Ringer's lactate in the ED.  No more IV fluids for now.  Monitor BMP.  History of stroke: Continue aspirin  Hypertension: Hold lisinopril for now.  CKD stage IIIa: Monitor BMP    Other information:   DVT prophylaxis: Lovenox  Code Status: DNR.   Family Communication: Plan discussed with the daughter-in-law at the bedside Disposition Plan: Possible discharge home in 2 to 3 days Consults called:  None Admission status: Inpatient  The medical decision making on this patient was of high complexity and the patient is at high risk for clinical deterioration, therefore this is a level 3 visit.   Time spent  Lurene Shadow [facility] Pager: Please check [email or web address]   How to contact the [facility] Attending or Consulting provider 7A - 7P or covering provider during after hours 7P -7A, for this patient?   1. Check the care team in [facility] and look for a) attending/consulting TRH provider listed and b) the [facility] team listed 2. Log into [email or web address] and use [place]'s universal password to access. If you do not have the password, please contact the hospital operator. 3. Locate the [facility] provider you are looking for under [facility] and page to a number that you can be directly reached. 4. If you still have difficulty reaching the provider, please page the [facility] (Director on Call) for the Hospitalists listed on amion for assistance.  09/06/2020, 12:47 PM

## 2020-09-07 DIAGNOSIS — U071 COVID-19: Principal | ICD-10-CM

## 2020-09-07 DIAGNOSIS — J1282 Pneumonia due to coronavirus disease 2019: Secondary | ICD-10-CM

## 2020-09-07 LAB — FERRITIN: Ferritin: 249 ng/mL (ref 24–336)

## 2020-09-07 LAB — CBC WITH DIFFERENTIAL/PLATELET
Abs Immature Granulocytes: 0.06 10*3/uL (ref 0.00–0.07)
Basophils Absolute: 0 10*3/uL (ref 0.0–0.1)
Basophils Relative: 0 %
Eosinophils Absolute: 0 10*3/uL (ref 0.0–0.5)
Eosinophils Relative: 0 %
HCT: 40.4 % (ref 39.0–52.0)
Hemoglobin: 12.9 g/dL — ABNORMAL LOW (ref 13.0–17.0)
Immature Granulocytes: 0 %
Lymphocytes Relative: 5 %
Lymphs Abs: 0.6 10*3/uL — ABNORMAL LOW (ref 0.7–4.0)
MCH: 26.9 pg (ref 26.0–34.0)
MCHC: 31.9 g/dL (ref 30.0–36.0)
MCV: 84.3 fL (ref 80.0–100.0)
Monocytes Absolute: 0.6 10*3/uL (ref 0.1–1.0)
Monocytes Relative: 4 %
Neutro Abs: 12.6 10*3/uL — ABNORMAL HIGH (ref 1.7–7.7)
Neutrophils Relative %: 91 %
Platelets: 196 10*3/uL (ref 150–400)
RBC: 4.79 MIL/uL (ref 4.22–5.81)
RDW: 15.5 % (ref 11.5–15.5)
WBC: 13.9 10*3/uL — ABNORMAL HIGH (ref 4.0–10.5)
nRBC: 0 % (ref 0.0–0.2)

## 2020-09-07 LAB — COMPREHENSIVE METABOLIC PANEL
ALT: 11 U/L (ref 0–44)
AST: 20 U/L (ref 15–41)
Albumin: 2.4 g/dL — ABNORMAL LOW (ref 3.5–5.0)
Alkaline Phosphatase: 43 U/L (ref 38–126)
Anion gap: 10 (ref 5–15)
BUN: 50 mg/dL — ABNORMAL HIGH (ref 8–23)
CO2: 26 mmol/L (ref 22–32)
Calcium: 9.4 mg/dL (ref 8.9–10.3)
Chloride: 110 mmol/L (ref 98–111)
Creatinine, Ser: 1.15 mg/dL (ref 0.61–1.24)
GFR, Estimated: >60 mL/min (ref >60)
Glucose, Bld: 132 mg/dL — ABNORMAL HIGH (ref 70–99)
Potassium: 4.1 mmol/L (ref 3.5–5.1)
Sodium: 146 mmol/L — ABNORMAL HIGH (ref 135–145)
Total Bilirubin: 0.8 mg/dL (ref 0.3–1.2)
Total Protein: 6.8 g/dL (ref 6.5–8.1)

## 2020-09-07 LAB — PHOSPHORUS: Phosphorus: 3.2 mg/dL (ref 2.5–4.6)

## 2020-09-07 LAB — C-REACTIVE PROTEIN: CRP: 25.8 mg/dL — ABNORMAL HIGH (ref <1.0)

## 2020-09-07 LAB — FIBRIN DERIVATIVES D-DIMER (ARMC ONLY): Fibrin derivatives D-dimer (ARMC): 2113.19 ng/mL (FEU) — ABNORMAL HIGH (ref 0.00–499.00)

## 2020-09-07 LAB — MAGNESIUM: Magnesium: 2.2 mg/dL (ref 1.7–2.4)

## 2020-09-07 MED ORDER — LATANOPROST 0.005 % OP SOLN
1.0000 [drp] | Freq: Every day | OPHTHALMIC | Status: DC
  Administered 2020-09-07 – 2020-09-11 (×5): 1 [drp] via OPHTHALMIC
  Filled 2020-09-07: qty 2.5

## 2020-09-07 MED ORDER — BARICITINIB 2 MG PO TABS
4.0000 mg | ORAL_TABLET | Freq: Every day | ORAL | Status: DC
  Administered 2020-09-08 – 2020-09-10 (×3): 4 mg via ORAL
  Filled 2020-09-07 (×3): qty 2

## 2020-09-07 MED ORDER — SODIUM CHLORIDE 0.9 % IV SOLN
1.0000 g | INTRAVENOUS | Status: AC
  Administered 2020-09-07 – 2020-09-11 (×5): 1 g via INTRAVENOUS
  Filled 2020-09-07: qty 10
  Filled 2020-09-07: qty 1
  Filled 2020-09-07: qty 10
  Filled 2020-09-07: qty 1
  Filled 2020-09-07: qty 10

## 2020-09-07 MED ORDER — TIMOLOL MALEATE 0.5 % OP SOLN
1.0000 [drp] | Freq: Every day | OPHTHALMIC | Status: DC
  Administered 2020-09-07 – 2020-09-12 (×6): 1 [drp] via OPHTHALMIC
  Filled 2020-09-07: qty 5

## 2020-09-07 MED ORDER — SODIUM CHLORIDE 0.9 % IV SOLN
500.0000 mg | INTRAVENOUS | Status: AC
  Administered 2020-09-07 – 2020-09-11 (×5): 500 mg via INTRAVENOUS
  Filled 2020-09-07 (×6): qty 500

## 2020-09-07 MED ORDER — AMLODIPINE BESYLATE 5 MG PO TABS
5.0000 mg | ORAL_TABLET | Freq: Every day | ORAL | Status: DC
  Administered 2020-09-07 – 2020-09-12 (×6): 5 mg via ORAL
  Filled 2020-09-07 (×6): qty 1

## 2020-09-07 MED ORDER — DEXTROSE 5 % IV SOLN: INTRAVENOUS | Status: AC

## 2020-09-07 NOTE — Hospital Course (Signed)
David [name] is a 87 y.o. male with medical history significant for anxiety, arthritis, dementia, HTN, stroke, b/l carotid stenosis, CKD stage III a, recent right inguinal hernia repair November 2021.  He presented to the ED on 09/06/2020 with profound generalized weakness, confusion and cough for past 2 days, per report of patient's daughter-in-law at time of admission.  EMS was called, they found patient hypoxic with spO2 88% on room air.  In the ED, patient tested positive for COVID-19 infection and chest x-ray showed multifocal pneumonia.

## 2020-09-07 NOTE — Progress Notes (Signed)
PROGRESS NOTE    David [name]   XLK:[id number]  DOB: [birth year]  PCP: [facility]    DOA: 09/06/2020 LOS: 1   Brief Narrative   David [name] is a 87 y.o. male with medical history significant for anxiety, arthritis, dementia, HTN, stroke, b/l carotid stenosis, CKD stage III a, recent right inguinal hernia repair November 2021.  He presented to the ED on 09/06/2020 with profound generalized weakness, confusion and cough for past 2 days, per report of patient's daughter-in-law at time of admission.  EMS was called, they found patient hypoxic with spO2 88% on room air.  In the ED, patient tested positive for COVID-19 infection and chest x-ray showed multifocal pneumonia.     Assessment & Plan   Principal Problem:   Pneumonia due to COVID-19 virus Active Problems:   Acute hypoxemic respiratory failure (HCC)   Acute respiratory failure with hypoxia secondary to COVID-19 pneumonia and CAP -present on admission. Presented with cough, weakness and confusion, x-ray showing multifocal pneumonia and COVID-19 positive, O2 sat was 88% on room air with EMS.  Initially required 2 to 3 L/min supplemental oxygen. Presented with leukocytosis, elevated Pro-Cal concerning for secondary bacterial pneumonia as well. CRP extremely elevated 29.4>> 25.8 Procalcitonin elevated 0.74 >> 0.97 -- Continue remdesivir -- Start baricitinib -- Start Rocephin and Zithromax -- Oxygen as needed to keep O2 sat above 88%, wean as tolerated -- Received Decadron in the ED, has been weaned off oxygen today, will defer further steroids given confusion and dementia, avoid hospital related delirium  -- Monitor clinically, will resume steroids if he becomes hypoxic   Hypernatremia /dehydration -present on admission with sodium 147>>146.   Given 2 L LR in the ED.  Ongoing poor oral intake. --Gentle D5W for 24 hours -- Monitor BMP   History of stroke -continue aspirin  Essential hypertension -continue  amlodipine  AKI superimposed on CKD stage IIIa -presented with creatinine 1.58, improved to 1.15 after IV hydration in the ED.  AKI was closed due to dehydration and prerenal azotemia.   -- Monitor BMP -- Renally dose meds, avoid nephrotoxins and hypotension  BPH -continue Flomax    DVT prophylaxis: enoxaparin (LOVENOX) injection 40 mg Start: 09/06/20 2200 SCDs Start: 09/06/20 1739   Diet:  Diet Orders (From admission, onward)    Start     Ordered   09/06/20 1739  Diet Heart Room service appropriate? Yes; Fluid consistency: Thin  Diet effective now       Question Answer Comment  Room service appropriate? Yes   Fluid consistency: Thin      09/06/20 1738            Code Status: DNR    Subjective 09/07/20    Patient seen this morning at bedside.  He appears confused and answers no to all questions.  He is off oxygen.  He denies cough or shortness of breath.  Says he needs to have a BM soon.  Cannot remember what he ate for breakfast.  Oriented to self and tells me the name of his son and daughter-in-law.   Disposition Plan & Communication   Status is: Inpatient  Remains inpatient appropriate because:IV treatments appropriate due to intensity of illness or inability to take PO   Dispo: The patient is from: Home              Anticipated d/c is to: Home versus rehab  Anticipated d/c date is: 2 days              Patient currently is not medically stable to d/c.   Family Communication: None at bedside, will attempt to call son or daughter-in-law this afternoon   Consults, Procedures, Significant Events   Consultants:   None  Procedures:   None  Antimicrobials:  Anti-infectives (From admission, onward)   Start     Dose/Rate Route Frequency Ordered Stop   09/07/20 1000  remdesivir 100 mg in sodium chloride 0.9 % 100 mL IVPB       "Followed by" Linked Group Details   100 mg 200 mL/hr over 30 Minutes Intravenous Daily 09/06/20 1219 09/11/20 0959    09/06/20 1400  remdesivir 200 mg in sodium chloride 0.9% 250 mL IVPB       "Followed by" Linked Group Details   200 mg 580 mL/hr over 30 Minutes Intravenous Once 09/06/20 1219 09/06/20 1420        Objective   Vitals:   09/06/20 1727 09/06/20 1943 09/07/20 0431 09/07/20 0730  BP: (!) 170/69 (!) 168/61 (!) 171/69 (!) 175/68  Pulse: 96 68 66 63  Resp: 16 18 16 16   Temp: 98 F (36.7 C) 97.6 F (36.4 C) 97.8 F (36.6 C) 98.6 F (37 C)  TempSrc: Oral     SpO2: 96% 97% 98% 93%  Weight:      Height:        Intake/Output Summary (Last 24 hours) at 09/07/2020 0742 Last data filed at 09/07/2020 0546 Gross per 24 hour  Intake 1200 ml  Output 475 ml  Net 725 ml   Filed Weights   09/06/20 1124  Weight: 70 kg    Physical Exam:  General exam: awake, alert, no acute distress HEENT: atraumatic, clear conjunctiva, anicteric sclera, moist mucus membranes Respiratory system: CTAB diminished bases, normal respiratory effort, on room air Cardiovascular system: normal S1/S2, RRR, no pedal edema.   Gastrointestinal system: soft, NT, ND Central nervous system: A&O x1. no gross focal neurologic deficits, normal speech Extremities: moves all, no edema, no cyanosis Psychiatry: normal mood, congruent affect  Labs   Data Reviewed: I have personally reviewed following labs and imaging studies  CBC: Recent Labs  Lab 09/06/20 1136 09/07/20 0556  WBC 16.7* 13.9*  NEUTROABS 15.5* 12.6*  HGB 12.5* 12.9*  HCT 39.9 40.4  MCV 86.0 84.3  PLT 196 196   Basic Metabolic Panel: Recent Labs  Lab 09/06/20 1136  NA 147*  K 4.4  CL 108  CO2 26  GLUCOSE 142*  BUN 47*  CREATININE 1.58*  CALCIUM 9.2   GFR: Estimated Creatinine Clearance: 30.2 mL/min (A) (by C-G formula based on SCr of 1.58 mg/dL (H)). Liver Function Tests: Recent Labs  Lab 09/06/20 1136  AST 23  ALT 12  ALKPHOS 42  BILITOT 1.0  PROT 7.3  ALBUMIN 2.7*   No results for input(s): LIPASE, AMYLASE in the last 168  hours. No results for input(s): AMMONIA in the last 168 hours. Coagulation Profile: No results for input(s): INR, PROTIME in the last 168 hours. Cardiac Enzymes: No results for input(s): CKTOTAL, CKMB, CKMBINDEX, TROPONINI in the last 168 hours. BNP (last 3 results) No results for input(s): PROBNP in the last 8760 hours. HbA1C: No results for input(s): HGBA1C in the last 72 hours. CBG: Recent Labs  Lab 09/06/20 2056  GLUCAP 208*   Lipid Profile: No results for input(s): CHOL, HDL, LDLCALC, TRIG, CHOLHDL, LDLDIRECT in the last 72  hours. Thyroid Function Tests: No results for input(s): TSH, T4TOTAL, FREET4, T3FREE, THYROIDAB in the last 72 hours. Anemia Panel: Recent Labs    09/06/20 1756  FERRITIN 237   Sepsis Labs: Recent Labs  Lab 09/06/20 1136 09/06/20 1756  PROCALCITON 0.74 0.97    No results found for this or any previous visit (from the past 240 hour(s)).    Imaging Studies   DG Chest Portable 1 View  Result Date: 09/06/2020 CLINICAL DATA:  Hypoxia.  Weakness. EXAM: PORTABLE CHEST 1 VIEW COMPARISON:  08/08/2016 FINDINGS: Normal heart size. Aortic atherosclerosis. Bilateral airspace opacities are identified, most notable within the right upper lobe and left lower lobe. No pleural effusion or edema. IMPRESSION: Bilateral airspace opacities compatible with multifocal infection. Followup PA and lateral chest X-ray is recommended in 3-4 weeks following trial of antibiotic therapy to ensure resolution and exclude underlying malignancy. Electronically Signed   By: Signa Kell M.D.   On: 09/06/2020 12:01     Medications   Scheduled Meds: . aspirin EC  81 mg Oral Daily  . donepezil  5 mg Oral QHS  . enoxaparin (LOVENOX) injection  40 mg Subcutaneous Q24H  . lisinopril  5 mg Oral Daily  . mouth rinse  15 mL Mouth Rinse BID  . tamsulosin  0.4 mg Oral QPC supper   Continuous Infusions: . remdesivir 100 mg in NS 100 mL         LOS: 1 day    Time spent: 30  minutes    Pennie Banter, DO [facility]  09/07/2020, 7:42 AM    If 7PM-7AM, please contact night-coverage. How to contact the [facility] Attending or Consulting provider 7A - 7P or covering provider during after hours 7P -7A, for this patient?    1. Check the care team in [facility] and look for a) attending/consulting TRH provider listed and b) the [facility] team listed 2. Log into [email or web address] and use [place]'s universal password to access. If you do not have the password, please contact the hospital operator. 3. Locate the [facility] provider you are looking for under [facility] and page to a number that you can be directly reached. 4. If you still have difficulty reaching the provider, please page the [facility] (Director on Call) for the Hospitalists listed on amion for assistance.

## 2020-09-08 LAB — CBC WITH DIFFERENTIAL/PLATELET
Abs Immature Granulocytes: 0.11 10*3/uL — ABNORMAL HIGH (ref 0.00–0.07)
Basophils Absolute: 0 10*3/uL (ref 0.0–0.1)
Basophils Relative: 0 %
Eosinophils Absolute: 0 10*3/uL (ref 0.0–0.5)
Eosinophils Relative: 0 %
HCT: 40.6 % (ref 39.0–52.0)
Hemoglobin: 12.6 g/dL — ABNORMAL LOW (ref 13.0–17.0)
Immature Granulocytes: 1 %
Lymphocytes Relative: 8 %
Lymphs Abs: 0.9 10*3/uL (ref 0.7–4.0)
MCH: 26.6 pg (ref 26.0–34.0)
MCHC: 31 g/dL (ref 30.0–36.0)
MCV: 85.8 fL (ref 80.0–100.0)
Monocytes Absolute: 0.6 10*3/uL (ref 0.1–1.0)
Monocytes Relative: 5 %
Neutro Abs: 9.5 10*3/uL — ABNORMAL HIGH (ref 1.7–7.7)
Neutrophils Relative %: 86 %
Platelets: 207 10*3/uL (ref 150–400)
RBC: 4.73 MIL/uL (ref 4.22–5.81)
RDW: 15.4 % (ref 11.5–15.5)
WBC: 11 10*3/uL — ABNORMAL HIGH (ref 4.0–10.5)
nRBC: 0 % (ref 0.0–0.2)

## 2020-09-08 LAB — FIBRIN DERIVATIVES D-DIMER (ARMC ONLY): Fibrin derivatives D-dimer (ARMC): 1894.74 ng/mL (FEU) — ABNORMAL HIGH (ref 0.00–499.00)

## 2020-09-08 LAB — COMPREHENSIVE METABOLIC PANEL
ALT: 11 U/L (ref 0–44)
AST: 23 U/L (ref 15–41)
Albumin: 2.5 g/dL — ABNORMAL LOW (ref 3.5–5.0)
Alkaline Phosphatase: 46 U/L (ref 38–126)
Anion gap: 11 (ref 5–15)
BUN: 59 mg/dL — ABNORMAL HIGH (ref 8–23)
CO2: 26 mmol/L (ref 22–32)
Calcium: 9.2 mg/dL (ref 8.9–10.3)
Chloride: 107 mmol/L (ref 98–111)
Creatinine, Ser: 1.11 mg/dL (ref 0.61–1.24)
GFR, Estimated: >60 mL/min (ref >60)
Glucose, Bld: 150 mg/dL — ABNORMAL HIGH (ref 70–99)
Potassium: 4.1 mmol/L (ref 3.5–5.1)
Sodium: 144 mmol/L (ref 135–145)
Total Bilirubin: 0.5 mg/dL (ref 0.3–1.2)
Total Protein: 6.7 g/dL (ref 6.5–8.1)

## 2020-09-08 LAB — C-REACTIVE PROTEIN: CRP: 16.1 mg/dL — ABNORMAL HIGH (ref <1.0)

## 2020-09-08 LAB — PHOSPHORUS: Phosphorus: 2.9 mg/dL (ref 2.5–4.6)

## 2020-09-08 LAB — MAGNESIUM: Magnesium: 2.1 mg/dL (ref 1.7–2.4)

## 2020-09-08 LAB — FERRITIN: Ferritin: 230 ng/mL (ref 24–336)

## 2020-09-08 MED ORDER — DM-GUAIFENESIN ER 30-600 MG PO TB12
1.0000 | ORAL_TABLET | Freq: Two times a day (BID) | ORAL | Status: DC
  Administered 2020-09-08 – 2020-09-12 (×9): 1 via ORAL
  Filled 2020-09-08 (×9): qty 1

## 2020-09-08 MED ORDER — METHYLPREDNISOLONE SODIUM SUCC 40 MG IJ SOLR
40.0000 mg | Freq: Every day | INTRAMUSCULAR | Status: DC
  Administered 2020-09-08 – 2020-09-10 (×3): 40 mg via INTRAVENOUS
  Filled 2020-09-08 (×3): qty 1

## 2020-09-08 MED ORDER — MELATONIN 3 MG PO TABS
3.0000 mg | ORAL_TABLET | Freq: Every day | ORAL | Status: DC
  Administered 2020-09-09 – 2020-09-11 (×3): 3 mg via ORAL
  Filled 2020-09-08 (×5): qty 1

## 2020-09-08 MED ORDER — IPRATROPIUM-ALBUTEROL 20-100 MCG/ACT IN AERS
1.0000 | INHALATION_SPRAY | Freq: Four times a day (QID) | RESPIRATORY_TRACT | Status: DC
  Administered 2020-09-08 – 2020-09-12 (×15): 1 via RESPIRATORY_TRACT
  Filled 2020-09-08: qty 4

## 2020-09-08 NOTE — Progress Notes (Addendum)
PROGRESS NOTE    David [name]   ZOX:[id number]  DOB: [birth year]  PCP: [facility]    DOA: 09/06/2020 LOS: 2   Brief Narrative   David [name] is a 87 y.o. male with medical history significant for anxiety, arthritis, dementia, HTN, stroke, b/l carotid stenosis, CKD stage III a, recent right inguinal hernia repair November 2021.  He presented to the ED on 09/06/2020 with profound generalized weakness, confusion and cough for past 2 days, per report of patient's daughter-in-law at time of admission.  EMS was called, they found patient hypoxic with spO2 88% on room air.  In the ED, patient tested positive for COVID-19 infection and chest x-ray showed multifocal pneumonia.     Assessment & Plan   Principal Problem:   Pneumonia due to COVID-19 virus Active Problems:   Acute hypoxemic respiratory failure (HCC)   Acute respiratory failure with hypoxia secondary to COVID-19 pneumonia and CAP -present on admission. Presented with cough, weakness and confusion, x-ray showing multifocal pneumonia and COVID-19 positive, O2 sat was 88% on room air with EMS.  Initially required 2 to 3 L/min supplemental oxygen. Presented with leukocytosis, elevated Pro-Cal concerning for secondary bacterial pneumonia as well. CRP extremely elevated 29.4>> 25.8 Procalcitonin elevated 0.74 >> 0.97 1/14 - continues to desat with exertion, spO2 85% with therapy today while on room air. -- Resumed on steroids due to recurrent hypoxia, continue Solu-medrol -- Continue remdesivir -- Continue baricitinib -- Continue Rocephin and Zithromax -- Mucinex BID -- Combivent q6h while awake -- Oxygen as needed to keep O2 sat above 88%, wean as tolerated   Hypernatremia /dehydration -present on admission with sodium 147>>146.  Resolved with D5 started yesterday, 1/13.  Sodium this morning 144. -- Gentle D5W for 24 hours, to end later this afternoon. -- Monitor off D5W overnight/tomorrow -- Encourage pt to  drink water -- Monitor BMP   Orthostatic hypotension - with baseline hypertension.  With PT/OT patient's BP sitting 123/62, standing 93/65.  Recommend TED hose.  Encourage hydration.  May need to reduce his antihypertensives.  Monitor for now. Daily orthostatic vitals.   History of stroke -continue aspirin  Essential hypertension -continue amlodipine, lisinopril  AKI superimposed on CKD stage IIIa -presented with creatinine 1.58, improved to 1.15 after IV hydration in the ED.  AKI was closed due to dehydration and prerenal azotemia.   -- Monitor BMP -- Renally dose meds, avoid nephrotoxins and hypotension  BPH -continue Flomax    DVT prophylaxis: enoxaparin (LOVENOX) injection 40 mg Start: 09/06/20 2200 SCDs Start: 09/06/20 1739   Diet:  Diet Orders (From admission, onward)    Start     Ordered   09/06/20 1739  Diet Heart Room service appropriate? Yes; Fluid consistency: Thin  Diet effective now       Question Answer Comment  Room service appropriate? Yes   Fluid consistency: Thin      09/06/20 1738            Code Status: DNR    Subjective 09/08/20    Patient seen this morning at bedside.  He reports not sleeping well last night and had just dozed off before I entered the room.  He otherwise reports feeling fine without shortness of breath, fevers, chills, chest pain nausea vomiting or other complaints.  Per PT, patient became orthostatic with standing while they were working with him.  He also dropped his O2 sat and was placed back on 2 L oxygen.   Disposition Plan &  Communication   Status is: Inpatient  Remains inpatient appropriate because:IV treatments appropriate due to intensity of illness or inability to take PO.   Will complete full 5 day course remdesivir.  SNF placement pending.   Dispo: The patient is from: Home              Anticipated d/c is to: Home versus rehab              Anticipated d/c date is: 2 days              Patient currently is not  medically stable to d/c.   Family Communication: Spoke with son and daughter-in-law by phone this afternoon   Consults, Procedures, Significant Events   Consultants:   None  Procedures:   None  Antimicrobials:  Anti-infectives (From admission, onward)   Start     Dose/Rate Route Frequency Ordered Stop   09/07/20 1615  cefTRIAXone (ROCEPHIN) 1 g in sodium chloride 0.9 % 100 mL IVPB        1 g 200 mL/hr over 30 Minutes Intravenous Every 24 hours 09/07/20 1524     09/07/20 1615  azithromycin (ZITHROMAX) 500 mg in sodium chloride 0.9 % 250 mL IVPB        500 mg 250 mL/hr over 60 Minutes Intravenous Every 24 hours 09/07/20 1524     09/07/20 1000  remdesivir 100 mg in sodium chloride 0.9 % 100 mL IVPB       "Followed by" Linked Group Details   100 mg 200 mL/hr over 30 Minutes Intravenous Daily 09/06/20 1219 09/11/20 0959   09/06/20 1400  remdesivir 200 mg in sodium chloride 0.9% 250 mL IVPB       "Followed by" Linked Group Details   200 mg 580 mL/hr over 30 Minutes Intravenous Once 09/06/20 1219 09/06/20 1420        Objective   Vitals:   09/08/20 0446 09/08/20 0741 09/08/20 1203 09/08/20 1204  BP: (!) 172/64 (!) 168/64 131/72   Pulse: (!) 56 62 (!) 43 61  Resp: 18 18 18    Temp: (!) 97.4 F (36.3 C) 97.8 F (36.6 C) (!) 96.7 F (35.9 C)   TempSrc:      SpO2: 94% 93% (!) 86% 95%  Weight:      Height:        Intake/Output Summary (Last 24 hours) at 09/08/2020 1441 Last data filed at 09/08/2020 0925 Gross per 24 hour  Intake --  Output 775 ml  Net -775 ml   Filed Weights   09/06/20 1124  Weight: 70 kg    Physical Exam:  General exam: awake, alert, no acute distress Respiratory system: mild expiratory wheezes on left, clear on right, bases diminished, normal respiratory effort, on room air Cardiovascular system: normal S1/S2, RRR, no pedal edema.   Central nervous system: A&O x1. no gross focal neurologic deficits, normal speech Extremities: moves all, no  edema, no cyanosis    Labs   Data Reviewed: I have personally reviewed following labs and imaging studies  CBC: Recent Labs  Lab 09/06/20 1136 09/07/20 0556 09/08/20 0355  WBC 16.7* 13.9* 11.0*  NEUTROABS 15.5* 12.6* 9.5*  HGB 12.5* 12.9* 12.6*  HCT 39.9 40.4 40.6  MCV 86.0 84.3 85.8  PLT 196 196 207   Basic Metabolic Panel: Recent Labs  Lab 09/06/20 1136 09/07/20 0556 09/08/20 0355  NA 147* 146* 144  K 4.4 4.1 4.1  CL 108 110 107  CO2 26 26 26  GLUCOSE 142* 132* 150*  BUN 47* 50* 59*  CREATININE 1.58* 1.15 1.11  CALCIUM 9.2 9.4 9.2  MG  --  2.2 2.1  PHOS  --  3.2 2.9   GFR: Estimated Creatinine Clearance: 42.9 mL/min (by C-G formula based on SCr of 1.11 mg/dL). Liver Function Tests: Recent Labs  Lab 09/06/20 1136 09/07/20 0556 09/08/20 0355  AST 23 20 23   ALT 12 11 11   ALKPHOS 42 43 46  BILITOT 1.0 0.8 0.5  PROT 7.3 6.8 6.7  ALBUMIN 2.7* 2.4* 2.5*   No results for input(s): LIPASE, AMYLASE in the last 168 hours. No results for input(s): AMMONIA in the last 168 hours. Coagulation Profile: No results for input(s): INR, PROTIME in the last 168 hours. Cardiac Enzymes: No results for input(s): CKTOTAL, CKMB, CKMBINDEX, TROPONINI in the last 168 hours. BNP (last 3 results) No results for input(s): PROBNP in the last 8760 hours. HbA1C: No results for input(s): HGBA1C in the last 72 hours. CBG: Recent Labs  Lab 09/06/20 2056  GLUCAP 208*   Lipid Profile: No results for input(s): CHOL, HDL, LDLCALC, TRIG, CHOLHDL, LDLDIRECT in the last 72 hours. Thyroid Function Tests: No results for input(s): TSH, T4TOTAL, FREET4, T3FREE, THYROIDAB in the last 72 hours. Anemia Panel: Recent Labs    09/07/20 0556 09/08/20 0355  FERRITIN 249 230   Sepsis Labs: Recent Labs  Lab 09/06/20 1136 09/06/20 1756  PROCALCITON 0.74 0.97    No results found for this or any previous visit (from the past 240 hour(s)).    Imaging Studies   No results  found.   Medications   Scheduled Meds: . amLODipine  5 mg Oral Daily  . aspirin EC  81 mg Oral Daily  . baricitinib  4 mg Oral Daily  . dextromethorphan-guaiFENesin  1 tablet Oral BID  . donepezil  5 mg Oral QHS  . enoxaparin (LOVENOX) injection  40 mg Subcutaneous Q24H  . latanoprost  1 drop Both Eyes QHS  . lisinopril  5 mg Oral Daily  . mouth rinse  15 mL Mouth Rinse BID  . methylPREDNISolone (SOLU-MEDROL) injection  40 mg Intravenous Daily  . tamsulosin  0.4 mg Oral QPC supper  . timolol  1 drop Both Eyes Daily   Continuous Infusions: . azithromycin 500 mg (09/07/20 1804)  . cefTRIAXone (ROCEPHIN)  IV 1 g (09/07/20 1645)  . dextrose 50 mL/hr at 09/07/20 1833  . remdesivir 100 mg in NS 100 mL 100 mg (09/08/20 0851)       LOS: 2 days    Time spent: 25 minutes with greater than 50% spent at bedside and in coordination of care    09/09/20, DO [facility]  09/08/2020, 2:41 PM    If 7PM-7AM, please contact night-coverage. How to contact the [facility] Attending or Consulting provider 7A - 7P or covering provider during after hours 7P -7A, for this patient?    1. Check the care team in [facility] and look for a) attending/consulting [facility] provider listed and b) the [facility] team listed 2. Log into [email or web address] and use [place]'s universal password to access. If you do not have the password, please contact the hospital operator. 3. Locate the [facility] provider you are looking for under [facility] and page to a number that you can be directly reached. 4. If you still have difficulty reaching the provider, please page the [facility] (Director on Call) for the Hospitalists listed on amion for assistance.

## 2020-09-08 NOTE — Evaluation (Signed)
Physical Therapy Evaluation Patient Details Name: David [name] MRN: [id number] DOB: [birth year] Today's Date: 09/08/2020   History of Present Illness  Pt is a 87 y/o M admitted from home on 09/07/19 with c/c of weakness & cough x 2 days. Pt tested (+) for COVID & chest x ray revealed multifocal pneumonia. PMH: anxiety, RA, dementia, HTN, stroke, CKD stage 3a, R inguinal hernia repair 06/2020  Clinical Impression  Pt seen with co-tx for OT for evaluation. Prior to admission pt was mod I with RW/cane but does have hx of falling a couple times this past month. Today pt requires min assist for bed mobility & min<>mod assist for sit<>stand and stand pivot with RW. Pt demonstrates posterior lean in sitting that worsens in standing with PT providing cuing to attempt to correct. Pt c/o dizziness in standing & found to be orthostatic (BP sitting: 123/62 mmHg (LUE), BP standing: 93/65 mmHg (LUE)), pt also placed on 2L/min supplemental oxygen as pt 85-90% on room air. In static standing with BUE support on RW & min assist +2 for standing pt does demonstrate lateral sway. PT also provides cuing for proper hand placement and sequencing to increase ease of transfers. Will continue to follow pt acutely to focus on transfers & progress gait as able. Pt would benefit from STR at SNF prior to return home to maximize independence with all mobility & reduce fall risk.  During session PT educated pt on proper use of incentive spirometer & acapella flutter valve but anticipate pt will need ongoing education 2/2 poor memory/slight confusion.     Follow Up Recommendations SNF    Equipment Recommendations   (TBD in next venue)    Recommendations for Other Services       Precautions / Restrictions Precautions Precautions: Fall Restrictions Weight Bearing Restrictions: No Other Position/Activity Restrictions: monitor BP, orthostatic with standing.      Mobility  Bed Mobility Overal bed mobility: Needs  Assistance Bed Mobility: Supine to Sit     Supine to sit: Min assist;HOB elevated     General bed mobility comments: increased time, use of bed rails, assist to manage trunk    Transfers Overall transfer level: Needs assistance Equipment used: Rolling walker (2 wheeled) Transfers: Sit to/from [employer] Sit to Stand: Min assist;Mod assist;+2 safety/equipment Stand pivot transfers: Min assist;Mod assist;+2 safety/equipment       General transfer comment: Pt requires cuing to scoot out to edge of seat, anterior weight shifting, hand placement to push to standing. Pt with strong posterior lean and requires cuing to activate hip extensors/glutes to assist with upright standing.  Ambulation/Gait - Unable to progress gait 2/2 dizziness & orthostatic hypotension.                Stairs            Wheelchair Mobility    Modified Rankin (Stroke Patients Only)       Balance Overall balance assessment: Needs assistance Sitting-balance support: Feet supported Sitting balance-Leahy Scale: Fair Sitting balance - Comments: F static sitting, P dynamic sitting requiring MIN A to right himself/come to midline Postural control: Posterior lean Standing balance support: Bilateral upper extremity supported Standing balance-Leahy Scale: Poor Standing balance comment: requires external support from OT/PT and UE support on RW bilaterally.                             Pertinent Vitals/Pain Pain Assessment: No/denies pain  Home Living Family/patient expects to be discharged to:: Private residence Living Arrangements: Alone Available Help at Discharge: Family;Available PRN/intermittently Type of Home: House Home Access: Stairs to enter Entrance Stairs-Rails: Right;Left (wide set) Entrance Stairs-Number of Steps: 3 Home Layout: One level Home Equipment: Walker - 2 wheels;Cane - quad;Shower seat      Prior Function Level of Independence: Needs  assistance   Gait / Transfers Assistance Needed: Pt is somewhat disoriented this date, he reports alternating AD for HH fxl mobility between cane and walker.  ADL's / Homemaking Assistance Needed: Pt reports that his son and DIL help him with IADLs such as cooking/cleaning and help him with bathing for safety. Pt reports otherwise being able to complete his basic ADLs.  Comments: Endorses at least 2-3 falls in last 6 months.     Hand Dominance        Extremity/Trunk Assessment   Upper Extremity Assessment Upper Extremity Assessment: Generalized weakness    Lower Extremity Assessment Lower Extremity Assessment: Generalized weakness    Cervical / Trunk Assessment Cervical / Trunk Assessment:  (posterior pelvic tilt)  Communication      Cognition Arousal/Alertness: Awake/alert Behavior During Therapy: WFL for tasks assessed/performed Overall Cognitive Status: No family/caregiver present to determine baseline cognitive functioning                                 General Comments: Pt oriented to [employer] and self. Able to determine he is in a hospital when given two options. Looks at board to orient himself to time information. Pt able to follow simple one step commands with increased time.      General Comments General comments (skin integrity, edema, etc.): Pt on room air during session & when SpO2 checked, pt appears to fluctuate betwee 85-90%, pt with c/o slight SOB. Pt placed & left on 2L/min supplemental oxygen via nasal cannula & SpO2 >/= 90%.    Exercises    Assessment/Plan    PT Assessment Patient needs continued PT services  PT Problem List Decreased strength;Decreased mobility;Decreased safety awareness;Decreased coordination;Decreased knowledge of precautions;Decreased activity tolerance;Decreased cognition;Cardiopulmonary status limiting activity;Impaired sensation;Decreased knowledge of use of DME;Decreased balance       PT Treatment  Interventions      PT Goals (Current goals can be found in the Care Plan section)  Acute Rehab PT Goals Patient Stated Goal: none stated PT Goal Formulation: With patient Time For Goal Achievement: 09/22/20 Potential to Achieve Goals: Good    Frequency Min 2X/week   Barriers to discharge Decreased caregiver support lives alone    Co-evaluation PT/OT/SLP Co-Evaluation/Treatment: Yes Reason for Co-Treatment: Complexity of the patient's impairments (multi-system involvement);For patient/therapist safety;To address functional/ADL transfers PT goals addressed during session: Mobility/safety with mobility;Balance;Proper use of DME OT goals addressed during session: ADL's and self-care;Proper use of Adaptive equipment and DME       AM-PAC PT "6 Clicks" Mobility  Outcome Measure Help needed turning from your back to your side while in a flat bed without using bedrails?: A Little Help needed moving from lying on your back to sitting on the side of a flat bed without using bedrails?: A Lot Help needed moving to and from a bed to a chair (including a wheelchair)?: A Lot Help needed standing up from a chair using your arms (e.g., wheelchair or bedside chair)?: A Lot Help needed to walk in hospital room?: A Lot Help needed climbing 3-5 steps with a  railing? : A Lot 6 Click Score: 13    End of Session Equipment Utilized During Treatment: Gait belt;Oxygen Activity Tolerance:  (limited by dizziness with standing) Patient left: in chair;with chair alarm set;with call bell/phone within reach Nurse Communication: Mobility status (O2) PT Visit Diagnosis: Unsteadiness on feet (R26.81);Difficulty in walking, not elsewhere classified (R26.2);Muscle weakness (generalized) (M62.81);History of falling (Z91.81)    Time: 1123-1202 PT Time Calculation (min) (ACUTE ONLY): 39 min   Charges:   PT Evaluation $PT Eval Low Complexity: 1 Low PT Treatments $Therapeutic Activity: 8-22 mins         Vibramycin   Sandi Mariscal 09/08/2020, 1:46 PM

## 2020-09-08 NOTE — Evaluation (Signed)
Occupational Therapy Evaluation Patient Details Name: David [name] MRN: [id number] DOB: [birth year] Today's Date: 09/08/2020    History of Present Illness Pt is a 87 y/o M admitted from home on 09/07/19 with c/c of weakness & cough x 2 days. Pt tested (+) for COVID & chest x ray revealed multifocal pneumonia. PMH: anxiety, RA, dementia, HTN, stroke, CKD stage 3a, R inguinal hernia repair 06/2020   Clinical Impression   Pt seen for OT evaluation this date in setting of acute hospitalization with COVID PNA. PT/OT co-tx completed for pt safety. Pt reports being MOD I with BADLs at baseline except for some help with son with bathing. Pt endorses requiring assist from his son and DIL for IADLs such as cooking/cleaning. In addition, pt reports alternating AE for fxl mobility in the home. This date, pt presents with decreased strength and fxl activity tolerance. Pt requires MIN A for sup to sit transition and while he has fair static sitting balance, he requires MIN A for dynamic sitting tasks. OT facilitaets ed re: role of OT in acute setting, importance of OOB activity, safety with hand placement with RW, and engages pt in UB grooming tasks with SETUP as well as LB dressing tasks such as donning socks with MIN A d/t decreased dynamic sitting balance. PT/OT provide MIN/MOD A +2 for safety/equipment mgt to transfer pt safely to chair. Pt tolerates 3 transfer trials total, but is noted to get progressively worse with each try. Orthostatics assessed and results as follows: sitting 123/62, standing 93/65. In addition, SpO2 appears to fluctuate between 85-90% on RA, OT/PT place 2Lnc and pt sats 90-93%.  MD and RN notified. Anticipate pt will require f/u rehabilitation at SNF to reach highest potential for safety with self care and ADL mobility.     Follow Up Recommendations  SNF    Equipment Recommendations  3 in 1 bedside commode    Recommendations for Other Services       Precautions / Restrictions  Precautions Precautions: Fall Restrictions Weight Bearing Restrictions: No Other Position/Activity Restrictions: monitor BP, orthostatic with standing.      Mobility Bed Mobility Overal bed mobility: Needs Assistance Bed Mobility: Supine to Sit     Supine to sit: Min assist;HOB elevated     General bed mobility comments: increased time, use of bed rails, assist to manage trunk    Transfers Overall transfer level: Needs assistance Equipment used: Rolling walker (2 wheeled) Transfers: Sit to/from [employer] Sit to Stand: Min assist;Mod assist;+2 safety/equipment Stand pivot transfers: Min assist;Mod assist;+2 safety/equipment       General transfer comment: Pt physically contributes well to STS, but is noted to be swaying and unsteady requring support bilaterally for safety.    Balance Overall balance assessment: Needs assistance Sitting-balance support: Feet supported Sitting balance-Leahy Scale: Fair Sitting balance - Comments: F static sitting, P dynamic sitting requiring MIN A to right himself/come to midline   Standing balance support: Bilateral upper extremity supported Standing balance-Leahy Scale: Poor Standing balance comment: requires external support from OT/PT and UE support on RW bilaterally.                           ADL either performed or assessed with clinical judgement   ADL  General ADL Comments: Pt requires SETUP with seated UB ADLs as well as MIN cues to sequence and requires MIN A with seated LB ADLs such as donning socks d/t difficulty sustaining dynamic sitting balance. Pt with limited standing tolerance d/t becoming orthostatic, so requires MIN/MOD A +2 with RW For ADL transfer, demos moderate amount of sway and requires CGA bilaterally to sustain static stand.     Vision Patient Visual Report: No change from baseline       Perception     Praxis       Pertinent Vitals/Pain Pain Assessment: No/denies pain     Hand Dominance     Extremity/Trunk Assessment Upper Extremity Assessment Upper Extremity Assessment: Generalized weakness   Lower Extremity Assessment Lower Extremity Assessment: Generalized weakness       Communication     Cognition Arousal/Alertness: Awake/alert Behavior During Therapy: WFL for tasks assessed/performed Overall Cognitive Status: No family/caregiver present to determine baseline cognitive functioning                                 General Comments: Pt oriented to [employer] and self. Able to determine he is in a hospital when given two options. Looks at board to orient himself to time information. Pt able to follow simple one step commands with increased time.   General Comments  SpO2 appears to fluctuate between 85-90% on RA, OT/PT place 2Lnc and pt sats 90-93%. Pt dizzy with position change and orthostatics assessed and results as follows: sitting 123/62, standing 93/65    Exercises Other Exercises Other Exercises: OT facilitaets ed re: role of OT in acute setting, importance of OOB activity, safety with hand placement with RW, and engages pt in UB grooming tasks with SETUP as well as LB dressing tasks such as donning socks with MIN A d/t decreased dynamic sitting balance.   Shoulder Instructions      Home Living Family/patient expects to be discharged to:: Private residence Living Arrangements: Alone Available Help at Discharge: Family;Available PRN/intermittently Type of Home: House Home Access: Stairs to enter [employer] of Steps: 3 Entrance Stairs-Rails: Right;Left (wide railing) Home Layout: One level     Bathroom Shower/Tub: Walk-in shower         Home Equipment: Environmental consultant - 2 wheels;Cane - quad;Shower seat          Prior Functioning/Environment Level of Independence: Needs assistance  Gait / Transfers Assistance Needed: Pt is somewhat disoriented this  date, he reports alternating AD for HH fxl mobility between cane and walker. ADL's / Homemaking Assistance Needed: Pt reports that his son and DIL help him with IADLs such as cooking/cleaning and help him with bathing for safety. Pt reports otherwise being able to complete his basic ADLs.   Comments: Endorses at least 2-3 falls in last 6 months.        OT Problem List: Decreased strength;Decreased activity tolerance;Impaired balance (sitting and/or standing);Decreased safety awareness;Decreased knowledge of use of DME or AE;Cardiopulmonary status limiting activity      OT Treatment/Interventions: Self-care/ADL training;DME and/or AE instruction;Therapeutic activities;Balance training;Therapeutic exercise;Energy conservation;Patient/family education    OT Goals(Current goals can be found in the care plan section) Acute Rehab OT Goals Patient Stated Goal: none stated OT Goal Formulation: With patient Time For Goal Achievement: 09/22/20 Potential to Achieve Goals: Good ADL Goals Pt Will Perform Upper Body Dressing: with modified independence;sitting Pt Will Perform Lower Body Dressing: with min guard assist;sit to/from stand  Pt Will Transfer to Toilet: with supervision;with min guard assist;ambulating;grab bars (with LRAD to [facility] ~10' away to increase tolerance for fxl HH distances) Pt/caregiver will Perform Home Exercise Program: Increased strength;Both right and left upper extremity;With minimal assist Additional ADL Goal #1: Pt will demo/verbalize 2 energy conservation strategies with <10% cues.  OT Frequency: Min 1X/week   Barriers to D/C: Decreased caregiver support  pt's son and DIL involved in care, but pt still lives alone       Co-evaluation PT/OT/SLP Co-Evaluation/Treatment: Yes Reason for Co-Treatment: Complexity of the patient's impairments (multi-system involvement);For patient/therapist safety;To address functional/ADL transfers PT goals addressed during session:  Mobility/safety with mobility;Balance;Proper use of DME OT goals addressed during session: ADL's and self-care;Proper use of Adaptive equipment and DME      AM-PAC OT "6 Clicks" Daily Activity     Outcome Measure Help from another person eating meals?: None Help from another person taking care of personal grooming?: A Little Help from another person toileting, which includes using toliet, bedpan, or urinal?: A Lot Help from another person bathing (including washing, rinsing, drying)?: A Lot Help from another person to put on and taking off regular upper body clothing?: A Little Help from another person to put on and taking off regular lower body clothing?: A Little 6 Click Score: 17   End of Session Equipment Utilized During Treatment: Gait belt;Rolling walker Nurse Communication: Mobility status;Other (comment) (notified RN and MD of pt's decreased sats and re-application of nasal cannula and orthostatics.)  Activity Tolerance: Patient tolerated treatment well;Treatment limited secondary to medical complications (Comment) (limited by BP) Patient left: in chair;with call bell/phone within reach;with chair alarm set  OT Visit Diagnosis: Unsteadiness on feet (R26.81);Muscle weakness (generalized) (M62.81)                Time: 9147-8295 OT Time Calculation (min): 39 min Charges:  OT General Charges $OT Visit: 1 Visit OT Evaluation $OT Eval Moderate Complexity: 1 Mod OT Treatments $Self Care/Home Management : 8-22 mins  Rejeana Brock, MS, OTR/L ascom [phone] 09/08/20, 12:20 PM

## 2020-09-08 NOTE — Care Management Important Message (Signed)
Important Message  Patient Details  Name: David [name] MRN: [id number] Date of Birth: 23-Sep-1926   Medicare Important Message Given:  N/A - LOS <3 / Initial given by admissions     Olegario Messier A Menno Vanbergen 09/08/2020, 9:11 AM

## 2020-09-09 LAB — CBC WITH DIFFERENTIAL/PLATELET
Abs Immature Granulocytes: 0.12 10*3/uL — ABNORMAL HIGH (ref 0.00–0.07)
Basophils Absolute: 0 10*3/uL (ref 0.0–0.1)
Basophils Relative: 0 %
Eosinophils Absolute: 0 10*3/uL (ref 0.0–0.5)
Eosinophils Relative: 0 %
HCT: 37.9 % — ABNORMAL LOW (ref 39.0–52.0)
Hemoglobin: 12.2 g/dL — ABNORMAL LOW (ref 13.0–17.0)
Immature Granulocytes: 2 %
Lymphocytes Relative: 14 %
Lymphs Abs: 1 10*3/uL (ref 0.7–4.0)
MCH: 26.9 pg (ref 26.0–34.0)
MCHC: 32.2 g/dL (ref 30.0–36.0)
MCV: 83.7 fL (ref 80.0–100.0)
Monocytes Absolute: 0.5 10*3/uL (ref 0.1–1.0)
Monocytes Relative: 7 %
Neutro Abs: 5.4 10*3/uL (ref 1.7–7.7)
Neutrophils Relative %: 77 %
Platelets: 220 10*3/uL (ref 150–400)
RBC: 4.53 MIL/uL (ref 4.22–5.81)
RDW: 15.2 % (ref 11.5–15.5)
WBC: 7.1 10*3/uL (ref 4.0–10.5)
nRBC: 0 % (ref 0.0–0.2)

## 2020-09-09 LAB — COMPREHENSIVE METABOLIC PANEL
ALT: 13 U/L (ref 0–44)
AST: 23 U/L (ref 15–41)
Albumin: 2.2 g/dL — ABNORMAL LOW (ref 3.5–5.0)
Alkaline Phosphatase: 41 U/L (ref 38–126)
Anion gap: 9 (ref 5–15)
BUN: 55 mg/dL — ABNORMAL HIGH (ref 8–23)
CO2: 25 mmol/L (ref 22–32)
Calcium: 8.8 mg/dL — ABNORMAL LOW (ref 8.9–10.3)
Chloride: 107 mmol/L (ref 98–111)
Creatinine, Ser: 1.02 mg/dL (ref 0.61–1.24)
GFR, Estimated: >60 mL/min (ref >60)
Glucose, Bld: 152 mg/dL — ABNORMAL HIGH (ref 70–99)
Potassium: 4.3 mmol/L (ref 3.5–5.1)
Sodium: 141 mmol/L (ref 135–145)
Total Bilirubin: 0.6 mg/dL (ref 0.3–1.2)
Total Protein: 6.2 g/dL — ABNORMAL LOW (ref 6.5–8.1)

## 2020-09-09 LAB — C-REACTIVE PROTEIN: CRP: 6.5 mg/dL — ABNORMAL HIGH (ref <1.0)

## 2020-09-09 LAB — FERRITIN: Ferritin: 136 ng/mL (ref 24–336)

## 2020-09-09 LAB — PHOSPHORUS: Phosphorus: 3.5 mg/dL (ref 2.5–4.6)

## 2020-09-09 LAB — MAGNESIUM: Magnesium: 2 mg/dL (ref 1.7–2.4)

## 2020-09-09 LAB — FIBRIN DERIVATIVES D-DIMER (ARMC ONLY): Fibrin derivatives D-dimer (ARMC): 1429.8 ng/mL (FEU) — ABNORMAL HIGH (ref 0.00–499.00)

## 2020-09-09 MED ORDER — HYDRALAZINE HCL 10 MG PO TABS
10.0000 mg | ORAL_TABLET | Freq: Four times a day (QID) | ORAL | Status: DC | PRN
  Filled 2020-09-09: qty 1

## 2020-09-09 MED ORDER — SENNOSIDES-DOCUSATE SODIUM 8.6-50 MG PO TABS
1.0000 | ORAL_TABLET | Freq: Every day | ORAL | Status: DC
  Administered 2020-09-09 – 2020-09-11 (×3): 1 via ORAL
  Filled 2020-09-09 (×3): qty 1

## 2020-09-09 MED ORDER — POLYETHYLENE GLYCOL 3350 17 G PO PACK
17.0000 g | PACK | Freq: Every day | ORAL | Status: DC
  Administered 2020-09-09 – 2020-09-12 (×4): 17 g via ORAL
  Filled 2020-09-09 (×4): qty 1

## 2020-09-09 MED ORDER — BISACODYL 10 MG RE SUPP: 10.0000 mg | Freq: Every day | RECTAL | Status: DC | PRN

## 2020-09-09 MED ORDER — BISACODYL 5 MG PO TBEC: 5.0000 mg | DELAYED_RELEASE_TABLET | Freq: Every day | ORAL | Status: DC | PRN

## 2020-09-09 NOTE — Progress Notes (Signed)
PROGRESS NOTE    David [name]   FPO:[id number]  DOB: February 04, 1927  PCP: [facility]    DOA: 09/06/2020 LOS: 3   Brief Narrative   David [name] is a 87 y.o. male with medical history significant for anxiety, arthritis, dementia, HTN, stroke, b/l carotid stenosis, CKD stage III a, recent right inguinal hernia repair November 2021.  He presented to the ED on 09/06/2020 with profound generalized weakness, confusion and cough for past 2 days, per report of patient's daughter-in-law at time of admission.  EMS was called, they found patient hypoxic with spO2 88% on room air.  In the ED, patient tested positive for COVID-19 infection and chest x-ray showed multifocal pneumonia.     Assessment & Plan   Principal Problem:   Pneumonia due to COVID-19 virus Active Problems:   Acute hypoxemic respiratory failure (HCC)   Acute respiratory failure with hypoxia secondary to COVID-19 pneumonia and CAP -present on admission. Presented with cough, weakness and confusion, x-ray showing multifocal pneumonia and COVID-19 positive, O2 sat was 88% on room air with EMS.  Initially required 2 to 3 L/min supplemental oxygen. Presented with leukocytosis, elevated Pro-Cal concerning for secondary bacterial pneumonia as well. CRP extremely elevated 29.4>> 25.8 Procalcitonin elevated 0.74 >> 0.97 1/14 - desat with exertion, spO2 85% with therapy today while on room air 1/15 - remains on 2 L/min O2 -- Resumed on steroids due to recurrent hypoxia, continue Solu-medrol -- Continue remdesivir -- Continue baricitinib -- Continue Rocephin and Zithromax x 5 days -- Mucinex BID -- Combivent q6h while awake -- Oxygen as needed to keep O2 sat above 88%, wean as tolerated   Hypernatremia /dehydration -present on admission with sodium 147>>146.  Resolved with D5W. -- Monitor BMP  Orthostatic hypotension - with baseline hypertension.  With PT/OT patient's BP sitting 123/62, standing 93/65.  Recommend  TED hose.  Encourage hydration.  May need to reduce his antihypertensives.  Monitor for now. Daily orthostatic vitals.  Constipation - bowel regimen per orders.  Enemas if needed.  History of stroke -continue aspirin  Essential hypertension -continue amlodipine, lisinopril  AKI superimposed on CKD stage IIIa -presented with creatinine 1.58, improved to 1.15 after IV hydration in the ED.  AKI was closed due to dehydration and prerenal azotemia.   -- Monitor BMP -- Renally dose meds, avoid nephrotoxins and hypotension  BPH -continue Flomax    DVT prophylaxis: Place TED hose Start: 09/08/20 1501 enoxaparin (LOVENOX) injection 40 mg Start: 09/06/20 2200 SCDs Start: 09/06/20 1739   Diet:  Diet Orders (From admission, onward)    Start     Ordered   09/06/20 1739  Diet Heart Room service appropriate? Yes; Fluid consistency: Thin  Diet effective now       Question Answer Comment  Room service appropriate? Yes   Fluid consistency: Thin      09/06/20 1738            Code Status: DNR    Subjective 09/09/20    Patient seen this morning at bedside.  Having mild abdominal discomfort and says he hasn't had BM in several days.  No CP, SOB, N/V or other acute complaints.   Disposition Plan & Communication   Status is: Inpatient  Remains inpatient appropriate because:IV treatments appropriate due to intensity of illness or inability to take PO.   Will complete full 5 day course remdesivir.  SNF placement pending.   Dispo: The patient is from: Home  Anticipated d/c is to: SNF              Anticipated d/c date is: pending SNF bed              Patient currently is not medically stable to d/c.   Family Communication: Spoke with son and daughter-in-law by phone afternoon 1/14.  Will attempt to call.   Consults, Procedures, Significant Events   Consultants:   None  Procedures:   None  Antimicrobials:  Anti-infectives (From admission, onward)   Start      Dose/Rate Route Frequency Ordered Stop   09/07/20 1615  cefTRIAXone (ROCEPHIN) 1 g in sodium chloride 0.9 % 100 mL IVPB        1 g 200 mL/hr over 30 Minutes Intravenous Every 24 hours 09/07/20 1524     09/07/20 1615  azithromycin (ZITHROMAX) 500 mg in sodium chloride 0.9 % 250 mL IVPB        500 mg 250 mL/hr over 60 Minutes Intravenous Every 24 hours 09/07/20 1524     09/07/20 1000  remdesivir 100 mg in sodium chloride 0.9 % 100 mL IVPB       "Followed by" Linked Group Details   100 mg 200 mL/hr over 30 Minutes Intravenous Daily 09/06/20 1219 09/11/20 0959   09/06/20 1400  remdesivir 200 mg in sodium chloride 0.9% 250 mL IVPB       "Followed by" Linked Group Details   200 mg 580 mL/hr over 30 Minutes Intravenous Once 09/06/20 1219 09/06/20 1420        Objective   Vitals:   09/09/20 0313 09/09/20 0549 09/09/20 0731 09/09/20 1220  BP: (!) 151/80 (!) 179/71 (!) 172/83 (!) 188/83  Pulse: 69 61 (!) 59 68  Resp:  16 18 18   Temp:  98.6 F (37 C) 97.7 F (36.5 C) 97.8 F (36.6 C)  TempSrc:      SpO2:  95% 93% 99%  Weight:      Height:        Intake/Output Summary (Last 24 hours) at 09/09/2020 1459 Last data filed at 09/09/2020 0315 Gross per 24 hour  Intake 900 ml  Output --  Net 900 ml   Filed Weights   09/06/20 1124  Weight: 70 kg    Physical Exam:  General exam: awake, alert, no acute distress Respiratory system: symmetric chest rise, normal respiratory effort, on room air Cardiovascular system: normal S1/S2, RRR, no pedal edema.   GI: soft, mildly tender mid/left lower abdomen without guarding or rebound Central nervous system: no gross focal neurologic deficits, normal speech Extremities: moves all, no edema, no cyanosis    Labs   Data Reviewed: I have personally reviewed following labs and imaging studies  CBC: Recent Labs  Lab 09/06/20 1136 09/07/20 0556 09/08/20 0355 09/09/20 0459  WBC 16.7* 13.9* 11.0* 7.1  NEUTROABS 15.5* 12.6* 9.5* 5.4  HGB  12.5* 12.9* 12.6* 12.2*  HCT 39.9 40.4 40.6 37.9*  MCV 86.0 84.3 85.8 83.7  PLT 196 196 207 220   Basic Metabolic Panel: Recent Labs  Lab 09/06/20 1136 09/07/20 0556 09/08/20 0355 09/09/20 0459  NA 147* 146* 144 141  K 4.4 4.1 4.1 4.3  CL 108 110 107 107  CO2 26 26 26 25   GLUCOSE 142* 132* 150* 152*  BUN 47* 50* 59* 55*  CREATININE 1.58* 1.15 1.11 1.02  CALCIUM 9.2 9.4 9.2 8.8*  MG  --  2.2 2.1 2.0  PHOS  --  3.2 2.9 3.5  GFR: Estimated Creatinine Clearance: 46.7 mL/min (by C-G formula based on SCr of 1.02 mg/dL). Liver Function Tests: Recent Labs  Lab 09/06/20 1136 09/07/20 0556 09/08/20 0355 09/09/20 0459  AST 23 20 23 23   ALT 12 11 11 13   ALKPHOS 42 43 46 41  BILITOT 1.0 0.8 0.5 0.6  PROT 7.3 6.8 6.7 6.2*  ALBUMIN 2.7* 2.4* 2.5* 2.2*   No results for input(s): LIPASE, AMYLASE in the last 168 hours. No results for input(s): AMMONIA in the last 168 hours. Coagulation Profile: No results for input(s): INR, PROTIME in the last 168 hours. Cardiac Enzymes: No results for input(s): CKTOTAL, CKMB, CKMBINDEX, TROPONINI in the last 168 hours. BNP (last 3 results) No results for input(s): PROBNP in the last 8760 hours. HbA1C: No results for input(s): HGBA1C in the last 72 hours. CBG: Recent Labs  Lab 09/06/20 2056  GLUCAP 208*   Lipid Profile: No results for input(s): CHOL, HDL, LDLCALC, TRIG, CHOLHDL, LDLDIRECT in the last 72 hours. Thyroid Function Tests: No results for input(s): TSH, T4TOTAL, FREET4, T3FREE, THYROIDAB in the last 72 hours. Anemia Panel: Recent Labs    09/08/20 0355 09/09/20 0459  FERRITIN 230 136   Sepsis Labs: Recent Labs  Lab 09/06/20 1136 09/06/20 1756  PROCALCITON 0.74 0.97    No results found for this or any previous visit (from the past 240 hour(s)).    Imaging Studies   No results found.   Medications   Scheduled Meds: . amLODipine  5 mg Oral Daily  . aspirin EC  81 mg Oral Daily  . baricitinib  4 mg Oral Daily   . dextromethorphan-guaiFENesin  1 tablet Oral BID  . donepezil  5 mg Oral QHS  . enoxaparin (LOVENOX) injection  40 mg Subcutaneous Q24H  . Ipratropium-Albuterol  1 puff Inhalation Q6H WA  . latanoprost  1 drop Both Eyes QHS  . lisinopril  5 mg Oral Daily  . mouth rinse  15 mL Mouth Rinse BID  . melatonin  3 mg Oral QHS  . methylPREDNISolone (SOLU-MEDROL) injection  40 mg Intravenous Daily  . polyethylene glycol  17 g Oral Daily  . senna-docusate  1 tablet Oral QHS  . tamsulosin  0.4 mg Oral QPC supper  . timolol  1 drop Both Eyes Daily   Continuous Infusions: . azithromycin Stopped (09/08/20 1808)  . cefTRIAXone (ROCEPHIN)  IV Stopped (09/08/20 1655)  . remdesivir 100 mg in NS 100 mL 100 mg (09/09/20 0903)       LOS: 3 days    Time spent: 25 minutes with greater than 50% spent at bedside and in coordination of care    09/10/20, DO [facility]  09/09/2020, 2:59 PM    If 7PM-7AM, please contact night-coverage. How to contact the [facility] Attending or Consulting provider 7A - 7P or covering provider during after hours 7P -7A, for this patient?    1. Check the care team in [facility] and look for a) attending/consulting TRH provider listed and b) the [facility] team listed 2. Log into [email or web address] and use [facility] universal password to access. If you do not have the password, please contact the hospital operator. 3. Locate the [facility] provider you are looking for under [facility] and page to a number that you can be directly reached. 4. If you still have difficulty reaching the provider, please page the [facility] (Director on Call) for the Hospitalists listed on amion for assistance.

## 2020-09-10 LAB — CBC WITH DIFFERENTIAL/PLATELET
Abs Immature Granulocytes: 0.16 10*3/uL — ABNORMAL HIGH (ref 0.00–0.07)
Basophils Absolute: 0 10*3/uL (ref 0.0–0.1)
Basophils Relative: 0 %
Eosinophils Absolute: 0 10*3/uL (ref 0.0–0.5)
Eosinophils Relative: 0 %
HCT: 37.9 % — ABNORMAL LOW (ref 39.0–52.0)
Hemoglobin: 12 g/dL — ABNORMAL LOW (ref 13.0–17.0)
Immature Granulocytes: 2 %
Lymphocytes Relative: 14 %
Lymphs Abs: 1.1 10*3/uL (ref 0.7–4.0)
MCH: 26.9 pg (ref 26.0–34.0)
MCHC: 31.7 g/dL (ref 30.0–36.0)
MCV: 85 fL (ref 80.0–100.0)
Monocytes Absolute: 0.4 10*3/uL (ref 0.1–1.0)
Monocytes Relative: 6 %
Neutro Abs: 5.8 10*3/uL (ref 1.7–7.7)
Neutrophils Relative %: 78 %
Platelets: 228 10*3/uL (ref 150–400)
RBC: 4.46 MIL/uL (ref 4.22–5.81)
RDW: 15.2 % (ref 11.5–15.5)
WBC: 7.5 10*3/uL (ref 4.0–10.5)
nRBC: 0 % (ref 0.0–0.2)

## 2020-09-10 LAB — COMPREHENSIVE METABOLIC PANEL
ALT: 13 U/L (ref 0–44)
AST: 21 U/L (ref 15–41)
Albumin: 2.2 g/dL — ABNORMAL LOW (ref 3.5–5.0)
Alkaline Phosphatase: 40 U/L (ref 38–126)
Anion gap: 9 (ref 5–15)
BUN: 47 mg/dL — ABNORMAL HIGH (ref 8–23)
CO2: 25 mmol/L (ref 22–32)
Calcium: 8.9 mg/dL (ref 8.9–10.3)
Chloride: 111 mmol/L (ref 98–111)
Creatinine, Ser: 1.02 mg/dL (ref 0.61–1.24)
GFR, Estimated: >60 mL/min (ref >60)
Glucose, Bld: 110 mg/dL — ABNORMAL HIGH (ref 70–99)
Potassium: 4.7 mmol/L (ref 3.5–5.1)
Sodium: 145 mmol/L (ref 135–145)
Total Bilirubin: 0.6 mg/dL (ref 0.3–1.2)
Total Protein: 5.9 g/dL — ABNORMAL LOW (ref 6.5–8.1)

## 2020-09-10 LAB — PHOSPHORUS: Phosphorus: 3.8 mg/dL (ref 2.5–4.6)

## 2020-09-10 LAB — MAGNESIUM: Magnesium: 2.1 mg/dL (ref 1.7–2.4)

## 2020-09-10 LAB — FERRITIN: Ferritin: 107 ng/mL (ref 24–336)

## 2020-09-10 LAB — C-REACTIVE PROTEIN: CRP: 3.9 mg/dL — ABNORMAL HIGH (ref <1.0)

## 2020-09-10 LAB — FIBRIN DERIVATIVES D-DIMER (ARMC ONLY): Fibrin derivatives D-dimer (ARMC): 1324.5 ng/mL (FEU) — ABNORMAL HIGH (ref 0.00–499.00)

## 2020-09-10 LAB — PROCALCITONIN: Procalcitonin: 0.13 ng/mL

## 2020-09-10 MED ORDER — BARICITINIB 2 MG PO TABS: 2.0000 mg | ORAL_TABLET | Freq: Every day | ORAL | Status: DC

## 2020-09-10 NOTE — Progress Notes (Addendum)
PROGRESS NOTE    David [name]   AVW:[id number]  DOB: 1926/09/02  PCP: [facility]    DOA: 09/06/2020 LOS: 4   Brief Narrative   David [name] is a 87 y.o. male with medical history significant for anxiety, arthritis, dementia, HTN, stroke, b/l carotid stenosis, CKD stage III a, recent right inguinal hernia repair November 2021.  He presented to the ED on 09/06/2020 with profound generalized weakness, confusion and cough for past 2 days, per report of patient's daughter-in-law at time of admission.  EMS was called, they found patient hypoxic with spO2 88% on room air.  In the ED, patient tested positive for COVID-19 infection and chest x-ray showed multifocal pneumonia.     Assessment & Plan   Principal Problem:   Pneumonia due to COVID-19 virus Active Problems:   Acute hypoxemic respiratory failure (HCC)   Acute respiratory failure with hypoxia secondary to COVID-19 pneumonia and CAP -present on admission. Presented with cough, weakness and confusion, x-ray showing multifocal pneumonia and COVID-19 positive, O2 sat was 88% on room air with EMS.  Initially required 2 to 3 L/min supplemental oxygen. Presented with leukocytosis, elevated Pro-Cal concerning for secondary bacterial pneumonia as well. CRP elevated 29.4>> 25.8>>16.1>>6.5>> today pending Procalcitonin elevated 0.74 >> 0.97 1/14 - desat with exertion, spO2 85% with therapy today while on room air 1/15-16 - remains on 2 L/min O2 oxygen -- Resumed on steroids due to recurrent hypoxia, continue Solu-medrol -- Continue remdesivir -- Continue baricitinib -- Continue Rocephin and Zithromax x 5 days (end 1/17) -- Mucinex BID -- Combivent q6h while awake -- Oxygen as needed to keep O2 sat above 88%, wean as tolerated -- Follow inflammatory markers, CMP   Hypernatremia /dehydration -present on admission with sodium 147.  Resolved with D5W. -- Monitor BMP  Orthostatic hypotension - with baseline hypertension.   With PT/OT patient's BP sitting 123/62, standing 93/65.  Recommend TED hose.  Encourage hydration.  May need to reduce his antihypertensives.  Monitor for now. Daily orthostatic vitals.  Constipation - bowel regimen per orders.  Enemas if needed.  History of stroke -continue aspirin  Essential hypertension -continue amlodipine, lisinopril  AKI superimposed on CKD stage IIIa -Resolved.  Presented with creatinine 1.58, improved to 1.15 after IV hydration in the ED.  AKI was likely due to dehydration and prerenal azotemia.   -- Monitor BMP  BPH -continue Flomax    DVT prophylaxis: Place TED hose Start: 09/08/20 1501 enoxaparin (LOVENOX) injection 40 mg Start: 09/06/20 2200 SCDs Start: 09/06/20 1739   Diet:  Diet Orders (From admission, onward)    Start     Ordered   09/06/20 1739  Diet Heart Room service appropriate? Yes; Fluid consistency: Thin  Diet effective now       Question Answer Comment  Room service appropriate? Yes   Fluid consistency: Thin      09/06/20 1738            Code Status: DNR    Subjective 09/10/20    Patient sleeping comfortably when seen today, woke easily.  Denies feeling shortness of breath, no N/V or other complaints.  Drifts back to sleep.  No acute events reported.  Disposition Plan & Communication   Status is: Inpatient  Remains inpatient appropriate because:IV treatments appropriate due to intensity of illness or inability to take PO.   Will complete full 5 day course remdesivir.  SNF placement pending.   Dispo: The patient is from: Home  Anticipated d/c is to: SNF              Anticipated d/c date is: pending SNF bed              Patient currently is not medically stable to d/c.   Family Communication: Spoke with son by phone afternoon 1/16.     Consults, Procedures, Significant Events   Consultants:   None  Procedures:   None  Antimicrobials:  Anti-infectives (From admission, onward)   Start     Dose/Rate  Route Frequency Ordered Stop   09/07/20 1615  cefTRIAXone (ROCEPHIN) 1 g in sodium chloride 0.9 % 100 mL IVPB        1 g 200 mL/hr over 30 Minutes Intravenous Every 24 hours 09/07/20 1524     09/07/20 1615  azithromycin (ZITHROMAX) 500 mg in sodium chloride 0.9 % 250 mL IVPB        500 mg 250 mL/hr over 60 Minutes Intravenous Every 24 hours 09/07/20 1524     09/07/20 1000  remdesivir 100 mg in sodium chloride 0.9 % 100 mL IVPB       "Followed by" Linked Group Details   100 mg 200 mL/hr over 30 Minutes Intravenous Daily 09/06/20 1219 09/10/20 1120   09/06/20 1400  remdesivir 200 mg in sodium chloride 0.9% 250 mL IVPB       "Followed by" Linked Group Details   200 mg 580 mL/hr over 30 Minutes Intravenous Once 09/06/20 1219 09/06/20 1420        Objective   Vitals:   09/10/20 0104 09/10/20 0436 09/10/20 0734 09/10/20 1135  BP: (!) 165/60 (!) 154/65 (!) 158/44 (!) 171/89  Pulse: 65 62 63 64  Resp: 18 18 16 16   Temp: (!) 97.5 F (36.4 C) 97.8 F (36.6 C) 98.1 F (36.7 C) 98 F (36.7 C)  TempSrc:      SpO2: 97%  96% 96%  Weight:      Height:        Intake/Output Summary (Last 24 hours) at 09/10/2020 1229 Last data filed at 09/10/2020 0900 Gross per 24 hour  Intake 220 ml  Output 450 ml  Net -230 ml   Filed Weights   09/06/20 1124  Weight: 70 kg    Physical Exam:  General exam: sleeping comfortable, woke easily, no acute distress Respiratory system: CTAB diminished bases, normal respiratory effort, 2 L/min O2 by [place] Cardiovascular system: normal S1/S2, RRR, no pedal edema.   GI: soft, non-tender Central nervous system: no gross focal neurologic deficits, normal speech Extremities: moves all, no edema, no cyanosis    Labs   Data Reviewed: I have personally reviewed following labs and imaging studies  CBC: Recent Labs  Lab 09/06/20 1136 09/07/20 0556 09/08/20 0355 09/09/20 0459 09/10/20 0449  WBC 16.7* 13.9* 11.0* 7.1 7.5  NEUTROABS 15.5* 12.6* 9.5* 5.4 5.8   HGB 12.5* 12.9* 12.6* 12.2* 12.0*  HCT 39.9 40.4 40.6 37.9* 37.9*  MCV 86.0 84.3 85.8 83.7 85.0  PLT 196 196 207 220 228   Basic Metabolic Panel: Recent Labs  Lab 09/06/20 1136 09/07/20 0556 09/08/20 0355 09/09/20 0459 09/10/20 0449  NA 147* 146* 144 141 145  K 4.4 4.1 4.1 4.3 4.7  CL 108 110 107 107 111  CO2 26 26 26 25 25   GLUCOSE 142* 132* 150* 152* 110*  BUN 47* 50* 59* 55* 47*  CREATININE 1.58* 1.15 1.11 1.02 1.02  CALCIUM 9.2 9.4 9.2 8.8* 8.9  MG  --  2.2 2.1 2.0 2.1  PHOS  --  3.2 2.9 3.5 3.8   GFR: Estimated Creatinine Clearance: 46.7 mL/min (by C-G formula based on SCr of 1.02 mg/dL). Liver Function Tests: Recent Labs  Lab 09/06/20 1136 09/07/20 0556 09/08/20 0355 09/09/20 0459 09/10/20 0449  AST 23 20 23 23 21   ALT 12 11 11 13 13   ALKPHOS 42 43 46 41 40  BILITOT 1.0 0.8 0.5 0.6 0.6  PROT 7.3 6.8 6.7 6.2* 5.9*  ALBUMIN 2.7* 2.4* 2.5* 2.2* 2.2*   No results for input(s): LIPASE, AMYLASE in the last 168 hours. No results for input(s): AMMONIA in the last 168 hours. Coagulation Profile: No results for input(s): INR, PROTIME in the last 168 hours. Cardiac Enzymes: No results for input(s): CKTOTAL, CKMB, CKMBINDEX, TROPONINI in the last 168 hours. BNP (last 3 results) No results for input(s): PROBNP in the last 8760 hours. HbA1C: No results for input(s): HGBA1C in the last 72 hours. CBG: Recent Labs  Lab 09/06/20 2056  GLUCAP 208*   Lipid Profile: No results for input(s): CHOL, HDL, LDLCALC, TRIG, CHOLHDL, LDLDIRECT in the last 72 hours. Thyroid Function Tests: No results for input(s): TSH, T4TOTAL, FREET4, T3FREE, THYROIDAB in the last 72 hours. Anemia Panel: Recent Labs    09/09/20 0459 09/10/20 0449  FERRITIN 136 107   Sepsis Labs: Recent Labs  Lab 09/06/20 1136 09/06/20 1756 09/10/20 0449  PROCALCITON 0.74 0.97 0.13    No results found for this or any previous visit (from the past 240 hour(s)).    Imaging Studies   No results  found.   Medications   Scheduled Meds: . amLODipine  5 mg Oral Daily  . aspirin EC  81 mg Oral Daily  . [START ON 09/11/2020] baricitinib  2 mg Oral Daily  . dextromethorphan-guaiFENesin  1 tablet Oral BID  . donepezil  5 mg Oral QHS  . enoxaparin (LOVENOX) injection  40 mg Subcutaneous Q24H  . Ipratropium-Albuterol  1 puff Inhalation Q6H WA  . latanoprost  1 drop Both Eyes QHS  . lisinopril  5 mg Oral Daily  . mouth rinse  15 mL Mouth Rinse BID  . melatonin  3 mg Oral QHS  . methylPREDNISolone (SOLU-MEDROL) injection  40 mg Intravenous Daily  . polyethylene glycol  17 g Oral Daily  . senna-docusate  1 tablet Oral QHS  . tamsulosin  0.4 mg Oral QPC supper  . timolol  1 drop Both Eyes Daily   Continuous Infusions: . azithromycin 500 mg (09/09/20 1649)  . cefTRIAXone (ROCEPHIN)  IV 1 g (09/09/20 1605)       LOS: 4 days    Time spent: 20 minutes    Pennie Banter, DO [facility]  09/10/2020, 12:29 PM    If 7PM-7AM, please contact night-coverage. How to contact the [facility] Attending or Consulting provider 7A - 7P or covering provider during after hours 7P -7A, for this patient?    1. Check the care team in [facility] and look for a) attending/consulting TRH provider listed and b) the [facility] team listed 2. Log into [email or web address] and use [place]'s universal password to access. If you do not have the password, please contact the hospital operator. 3. Locate the [facility] provider you are looking for under [facility] and page to a number that you can be directly reached. 4. If you still have difficulty reaching the provider, please page the [facility] (Director on Call) for the Hospitalists listed on amion for assistance.

## 2020-09-11 DIAGNOSIS — J9601 Acute respiratory failure with hypoxia: Secondary | ICD-10-CM

## 2020-09-11 LAB — COMPREHENSIVE METABOLIC PANEL
ALT: 14 U/L (ref 0–44)
AST: 21 U/L (ref 15–41)
Albumin: 2.2 g/dL — ABNORMAL LOW (ref 3.5–5.0)
Alkaline Phosphatase: 40 U/L (ref 38–126)
Anion gap: 7 (ref 5–15)
BUN: 48 mg/dL — ABNORMAL HIGH (ref 8–23)
CO2: 27 mmol/L (ref 22–32)
Calcium: 8.8 mg/dL — ABNORMAL LOW (ref 8.9–10.3)
Chloride: 108 mmol/L (ref 98–111)
Creatinine, Ser: 1.08 mg/dL (ref 0.61–1.24)
GFR, Estimated: >60 mL/min (ref >60)
Glucose, Bld: 113 mg/dL — ABNORMAL HIGH (ref 70–99)
Potassium: 4.6 mmol/L (ref 3.5–5.1)
Sodium: 142 mmol/L (ref 135–145)
Total Bilirubin: 0.5 mg/dL (ref 0.3–1.2)
Total Protein: 6 g/dL — ABNORMAL LOW (ref 6.5–8.1)

## 2020-09-11 LAB — FIBRIN DERIVATIVES D-DIMER (ARMC ONLY): Fibrin derivatives D-dimer (ARMC): 1272.29 ng/mL (FEU) — ABNORMAL HIGH (ref 0.00–499.00)

## 2020-09-11 LAB — FERRITIN: Ferritin: 99 ng/mL (ref 24–336)

## 2020-09-11 LAB — C-REACTIVE PROTEIN: CRP: 2.7 mg/dL — ABNORMAL HIGH (ref <1.0)

## 2020-09-11 MED ORDER — PREDNISOLONE 5 MG PO TABS: 30.0000 mg | ORAL_TABLET | Freq: Every day | ORAL | Status: DC

## 2020-09-11 MED ORDER — BARICITINIB 2 MG PO TABS
4.0000 mg | ORAL_TABLET | Freq: Every day | ORAL | Status: DC
  Administered 2020-09-11 – 2020-09-12 (×2): 4 mg via ORAL
  Filled 2020-09-11 (×2): qty 2

## 2020-09-11 MED ORDER — PREDNISONE 10 MG PO TABS
30.0000 mg | ORAL_TABLET | Freq: Every day | ORAL | Status: DC
  Administered 2020-09-11 – 2020-09-12 (×2): 30 mg via ORAL
  Filled 2020-09-11 (×2): qty 3

## 2020-09-11 NOTE — Progress Notes (Signed)
Patient resting in bed.  He is alert and oriented to person and place.  Able to sit up in bed and drink juice and take medications.  He states that he will eat later, not hungry at this time.  Requests more juice at bedside, provided for him.  No c/o pain or SOB.  No changes from prior assessment.  Will continue to monitor

## 2020-09-11 NOTE — TOC Initial Note (Signed)
Transition of Care [facility]) - Initial/Assessment Note    Patient Details  Name: David [name] MRN: [id number] Date of Birth: 1927/07/25  [facility] CM/SW Contact:    Allayne Butcher, RN Phone Number: 09/11/2020, 10:25 AM  Clinical Narrative:                 Patient admitted to the hospital with pneumonia due to COVID.  Patient currently requiring [place] at 2L.  RNCM was able to speak with patient's son, David [name], via phone.  Patient lives alone, David [name] lives close by and checks on the patient regularly.  Patient can prepare meals and get around the house with a cane.  David [name] helps with bathing and getting the patient dressed.  David [name] and his wife David [name] provide transportation for the patient.  PT has worked with patient and is recommending SNF.  David [name] has concerns about SNF placement because he had a bad experience with his mother at [facility].  David [name] will agree for patient to go to [facility].  [facility] will accept patients 10 days after COVID dx. Patient tested + on the 1/12.  Earliest for discharge would be 1/22.  RNCM will start SNF workup.   Expected Discharge Plan: Skilled Nursing Facility Barriers to Discharge: SNF Covid   Patient Goals and CMS Choice Patient states their goals for this hospitalization and ongoing recovery are:: Patient's son agrees to patient going to [facility] CMS Medicare.gov Compare Post Acute Care list provided to:: Patient Represenative (must comment) Choice offered to / list presented to : Adult Children  Expected Discharge Plan and Services Expected Discharge Plan: Skilled Nursing Facility   Discharge Planning Services: CM Consult Post Acute Care Choice: Skilled Nursing Facility Living arrangements for the past 2 months: Single Family Home                 DME Arranged: N/A DME Agency: NA       HH Arranged: NA          Prior Living Arrangements/Services Living arrangements for the past 2 months: Single Family Home Lives with:: Self Patient language and need  for interpreter reviewed:: Yes Do you feel safe going back to the place where you live?: Yes      Need for Family Participation in Patient Care: Yes (Comment) (COVID) Care giver support system in place?: Yes (comment) (son and daughter in law) Current home services: DME (cane) Criminal Activity/Legal Involvement Pertinent to Current Situation/Hospitalization: No - Comment as needed  Activities of Daily Living Home Assistive Devices/Equipment: Cane (specify quad or straight) ADL Screening (condition at time of admission) Patient's cognitive ability adequate to safely complete daily activities?: No Is the patient deaf or have difficulty hearing?: Yes Does the patient have difficulty seeing, even when wearing glasses/contacts?: No Does the patient have difficulty concentrating, remembering, or making decisions?: Yes Patient able to express need for assistance with ADLs?: Yes Does the patient have difficulty dressing or bathing?: No Independently performs ADLs?: No Communication: Needs assistance Is this a change from baseline?: Pre-admission baseline Dressing (OT): Needs assistance Is this a change from baseline?: Pre-admission baseline Grooming: Needs assistance Is this a change from baseline?: Pre-admission baseline Feeding: Independent Bathing: Needs assistance Is this a change from baseline?: Pre-admission baseline Toileting: Independent with device (comment) In/Out Bed: Independent with device (comment) Walks in Home: Independent with device (comment) Does the patient have difficulty walking or climbing stairs?: Yes Weakness of Legs: Both Weakness of Arms/Hands: Both  Permission Sought/Granted Permission sought to  share information with : Case Manager,Family Electrical engineer Permission granted to share information with : Yes, Verbal Permission Granted  Share Information with NAME: David [name]  Permission granted to share info w AGENCY: [facility]  Permission granted  to share info w Relationship: son     Emotional Assessment Appearance:: Appears stated age     Orientation: : Oriented to Self,Oriented to Place Alcohol / Substance Use: Not Applicable Psych Involvement: No (comment)  Admission diagnosis:  Acute respiratory failure with hypoxia (HCC) [J96.01] Chronic dementia without behavioral disturbance (HCC) [F03.90] Pneumonia due to COVID-19 virus [U07.1, J12.82] Patient Active Problem List   Diagnosis Date Noted  . Pneumonia due to COVID-19 virus 09/06/2020  . Acute hypoxemic respiratory failure (HCC) 09/06/2020  . Bilateral carotid artery stenosis 06/11/2017  . Dizziness 08/08/2016  . Syncopal episodes 08/08/2016   PCP:  [facility]:   [facility] [address] [place] - [address] [place] [address] Phone: [phone] Fax: [phone]     Social Determinants of Health (SDOH) Interventions    Readmission Risk Interventions No flowsheet data found.

## 2020-09-11 NOTE — Progress Notes (Signed)
PROGRESS NOTE    KASEEM VASTINE   VFI:[id number]  DOB: 04-16-1927  PCP: [facility]    DOA: 09/06/2020 LOS: 5   Brief Narrative   David [name] is a 87 y.o. male with medical history significant for anxiety, arthritis, dementia, HTN, stroke, b/l carotid stenosis, CKD stage III a, recent right inguinal hernia repair November 2021.  He presented to the ED on 09/06/2020 with profound generalized weakness, confusion and cough for past 2 days, per report of patient's daughter-in-law at time of admission.  EMS was called, they found patient hypoxic with spO2 88% on room air.  In the ED, patient tested positive for COVID-19 infection and chest x-ray showed multifocal pneumonia.     Assessment & Plan   Principal Problem:   Pneumonia due to COVID-19 virus Active Problems:   Acute hypoxemic respiratory failure (HCC)   Acute respiratory failure with hypoxia secondary to COVID-19 pneumonia and CAP -present on admission. Presented with cough, weakness and confusion, x-ray showing multifocal pneumonia and COVID-19 positive, O2 sat was 88% on room air with EMS.  Initially required 2 to 3 L/min supplemental oxygen. Also with leukocytosis, elevated Pro-Cal concerning for secondary bacterial pneumonia. CRP very elevated 29.4>>trending down, last CRP 2.7 Procalcitonin elevated 0.74 >> 0.97>>0.13 1/14 - desat to spO2 85% with PT on room air 1/15-17 - remains on 2 L/min O2 oxygen -- Continue Prednisone and taper down, on 30 mg     Steroids started 1/14 -- Completed remdesivir -- Continue baricitinib -- Complete Rocephin and Zithromax today 1/17 -- Mucinex BID -- Combivent q6h while awake -- Oxygen as needed to keep O2 sat above 88%, wean as tolerated -- Follow inflammatory markers, CMP   Hypernatremia /dehydration -present on admission with sodium 147.  Resolved with D5W.  Off d5.  Encourage oral hydration.  Monitor BMP  Orthostatic hypotension - with baseline hypertension.  With  PT/OT patient's BP sitting 123/62, standing 93/65.  Recommend TED hose.  Encourage hydration.  May need to reduce his antihypertensives.  Monitor for now. Daily orthostatic vitals.  Constipation - bowel regimen per orders.  Enemas if needed.  History of stroke -continue aspirin  Essential hypertension -continue amlodipine, lisinopril.  PRN hydralazine.  Has elevated systolic BP's, but diastolic is frequent low in 40's-50's.  Was also orthostatic.  Monitor closely.   AKI superimposed on CKD stage IIIa -Resolved.  Presented with creatinine 1.58, improved to 1.15 after IV hydration in the ED.  AKI was likely due to dehydration and prerenal azotemia.   -- Monitor BMP  BPH -continue Flomax    DVT prophylaxis: Place TED hose Start: 09/08/20 1501 enoxaparin (LOVENOX) injection 40 mg Start: 09/06/20 2200 SCDs Start: 09/06/20 1739   Diet:  Diet Orders (From admission, onward)    Start     Ordered   09/06/20 1739  Diet Heart Room service appropriate? Yes; Fluid consistency: Thin  Diet effective now       Question Answer Comment  Room service appropriate? Yes   Fluid consistency: Thin      09/06/20 1738            Code Status: DNR    Subjective 09/11/20    Patient resting in bed, awake.  Reports being very thirsty, wants cold water which I gave him.  Says feels pretty good.  No shortness of breath, fever/chills, chest pain.  Said he has not had BM yet.  Disposition Plan & Communication   Status is: Inpatient  Remains  inpatient appropriate because:IV treatments appropriate due to intensity of illness or inability to take PO.   SNF placement pending.   Dispo: The patient is from: Home              Anticipated d/c is to: SNF              Anticipated d/c date is: pending SNF bed              Patient currently is not medically stable to d/c.   Family Communication: Spoke with son by phone 1/16.  Will attempt to call.   Consults, Procedures, Significant Events   Consultants:    None  Procedures:   None  Antimicrobials:  Anti-infectives (From admission, onward)   Start     Dose/Rate Route Frequency Ordered Stop   09/07/20 1615  cefTRIAXone (ROCEPHIN) 1 g in sodium chloride 0.9 % 100 mL IVPB        1 g 200 mL/hr over 30 Minutes Intravenous Every 24 hours 09/07/20 1524 09/11/20 2359   09/07/20 1615  azithromycin (ZITHROMAX) 500 mg in sodium chloride 0.9 % 250 mL IVPB        500 mg 250 mL/hr over 60 Minutes Intravenous Every 24 hours 09/07/20 1524 09/11/20 1833   09/07/20 1000  remdesivir 100 mg in sodium chloride 0.9 % 100 mL IVPB       "Followed by" Linked Group Details   100 mg 200 mL/hr over 30 Minutes Intravenous Daily 09/06/20 1219 09/10/20 1120   09/06/20 1400  remdesivir 200 mg in sodium chloride 0.9% 250 mL IVPB       "Followed by" Linked Group Details   200 mg 580 mL/hr over 30 Minutes Intravenous Once 09/06/20 1219 09/06/20 1420        Objective   Vitals:   09/10/20 1531 09/10/20 2005 09/11/20 0607 09/11/20 0921  BP: (!) 172/87 (!) 171/51 (!) 173/60 (!) 165/53  Pulse: 68 62 65 64  Resp: 16 14 14 16   Temp: 98.1 F (36.7 C) (!) 97.5 F (36.4 C) (!) 97.5 F (36.4 C) 97.8 F (36.6 C)  TempSrc:  Oral Oral Oral  SpO2: 91% 94% 95% 94%  Weight:      Height:        Intake/Output Summary (Last 24 hours) at 09/11/2020 1848 Last data filed at 09/11/2020 0442 Gross per 24 hour  Intake -  Output 500 ml  Net -500 ml   Filed Weights   09/06/20 1124  Weight: 70 kg    Physical Exam:  General exam: resting in bed awake, no acute distress Respiratory system: symmetric chest rise, normal respiratory effort, 2 L/min O2 by , CTAB Cardiovascular system: normal S1/S2, RRR, no pedal edema GI: soft, non-tender Central nervous system: no gross focal neurologic deficits, normal speech Extremities: all, no edema, no cyanosis    Labs   Data Reviewed: I have personally reviewed following labs and imaging studies  CBC: Recent Labs  Lab  09/06/20 1136 09/07/20 0556 09/08/20 0355 09/09/20 0459 09/10/20 0449  WBC 16.7* 13.9* 11.0* 7.1 7.5  NEUTROABS 15.5* 12.6* 9.5* 5.4 5.8  HGB 12.5* 12.9* 12.6* 12.2* 12.0*  HCT 39.9 40.4 40.6 37.9* 37.9*  MCV 86.0 84.3 85.8 83.7 85.0  PLT 196 196 207 220 228   Basic Metabolic Panel: Recent Labs  Lab 09/07/20 0556 09/08/20 0355 09/09/20 0459 09/10/20 0449 09/11/20 0710  NA 146* 144 141 145 142  K 4.1 4.1 4.3 4.7 4.6  CL 110 107  107 111 108  CO2 26 26 25 25 27   GLUCOSE 132* 150* 152* 110* 113*  BUN 50* 59* 55* 47* 48*  CREATININE 1.15 1.11 1.02 1.02 1.08  CALCIUM 9.4 9.2 8.8* 8.9 8.8*  MG 2.2 2.1 2.0 2.1  --   PHOS 3.2 2.9 3.5 3.8  --    GFR: Estimated Creatinine Clearance: 44.1 mL/min (by C-G formula based on SCr of 1.08 mg/dL). Liver Function Tests: Recent Labs  Lab 09/07/20 0556 09/08/20 0355 09/09/20 0459 09/10/20 0449 09/11/20 0710  AST 20 23 23 21 21   ALT 11 11 13 13 14   ALKPHOS 43 46 41 40 40  BILITOT 0.8 0.5 0.6 0.6 0.5  PROT 6.8 6.7 6.2* 5.9* 6.0*  ALBUMIN 2.4* 2.5* 2.2* 2.2* 2.2*   No results for input(s): LIPASE, AMYLASE in the last 168 hours. No results for input(s): AMMONIA in the last 168 hours. Coagulation Profile: No results for input(s): INR, PROTIME in the last 168 hours. Cardiac Enzymes: No results for input(s): CKTOTAL, CKMB, CKMBINDEX, TROPONINI in the last 168 hours. BNP (last 3 results) No results for input(s): PROBNP in the last 8760 hours. HbA1C: No results for input(s): HGBA1C in the last 72 hours. CBG: Recent Labs  Lab 09/06/20 2056  GLUCAP 208*   Lipid Profile: No results for input(s): CHOL, HDL, LDLCALC, TRIG, CHOLHDL, LDLDIRECT in the last 72 hours. Thyroid Function Tests: No results for input(s): TSH, T4TOTAL, FREET4, T3FREE, THYROIDAB in the last 72 hours. Anemia Panel: Recent Labs    09/10/20 0449 09/11/20 0710  FERRITIN 107 99   Sepsis Labs: Recent Labs  Lab 09/06/20 1136 09/06/20 1756 09/10/20 0449   PROCALCITON 0.74 0.97 0.13    No results found for this or any previous visit (from the past 240 hour(s)).    Imaging Studies   No results found.   Medications   Scheduled Meds: . amLODipine  5 mg Oral Daily  . aspirin EC  81 mg Oral Daily  . baricitinib  4 mg Oral Daily  . dextromethorphan-guaiFENesin  1 tablet Oral BID  . donepezil  5 mg Oral QHS  . enoxaparin (LOVENOX) injection  40 mg Subcutaneous Q24H  . Ipratropium-Albuterol  1 puff Inhalation Q6H WA  . latanoprost  1 drop Both Eyes QHS  . lisinopril  5 mg Oral Daily  . mouth rinse  15 mL Mouth Rinse BID  . melatonin  3 mg Oral QHS  . polyethylene glycol  17 g Oral Daily  . predniSONE  30 mg Oral Q breakfast  . senna-docusate  1 tablet Oral QHS  . tamsulosin  0.4 mg Oral QPC supper  . timolol  1 drop Both Eyes Daily   Continuous Infusions: . cefTRIAXone (ROCEPHIN)  IV 1 g (09/10/20 2122)       LOS: 5 days    Time spent: 20 minutes    Pennie Banter, DO [facility]  09/11/2020, 6:48 PM    If 7PM-7AM, please contact night-coverage. How to contact the [facility] Attending or Consulting provider 7A - 7P or covering provider during after hours 7P -7A, for this patient?    1. Check the care team in [facility] and look for a) attending/consulting [facility] provider listed and b) the [facility] team listed 2. Log into [email or web address] and use [place]'s universal password to access. If you do not have the password, please contact the hospital operator. 3. Locate the [facility] provider you are looking for under [facility] and page to a number that  you can be directly reached. 4. If you still have difficulty reaching the provider, please page the [facility] (Director on Call) for the Hospitalists listed on amion for assistance.

## 2020-09-11 NOTE — NC FL2 (Signed)
[place] MEDICAID FL2 LEVEL OF CARE SCREENING TOOL     IDENTIFICATION  Patient Name: David [name] Birthdate: 03/30/1927 Sex: male Admission Date (Current Location): 09/06/2020  [place] and [place] Number:  Chiropodist and Address:  [facility], [address], [place], [place] [address]      Provider Number: [id number]  Attending Physician Name and Address:  Pennie Banter, DO  Relative Name and Phone Number:  Royal Vandevoort [phone]    Current Level of Care: Hospital Recommended Level of Care: Skilled Nursing Facility Prior Approval Number:    Date Approved/Denied:   PASRR Number: [id number]  Discharge Plan: SNF    Current Diagnoses: Patient Active Problem List   Diagnosis Date Noted  . Pneumonia due to COVID-19 virus 09/06/2020  . Acute hypoxemic respiratory failure (HCC) 09/06/2020  . Bilateral carotid artery stenosis 06/11/2017  . Dizziness 08/08/2016  . Syncopal episodes 08/08/2016    Orientation RESPIRATION BLADDER Height & Weight     Self,Place  O2 ([place] 2L) External catheter Weight: 70 kg Height:  5\' 9"  (175.3 cm)  BEHAVIORAL SYMPTOMS/MOOD NEUROLOGICAL BOWEL NUTRITION STATUS      Continent Diet (Heart healthy)  AMBULATORY STATUS COMMUNICATION OF NEEDS Skin   Limited Assist Verbally Normal                       Personal Care Assistance Level of Assistance  Bathing,Feeding,Dressing Bathing Assistance: Limited assistance Feeding assistance: Limited assistance Dressing Assistance: Limited assistance     Functional Limitations Info  Sight,Hearing Sight Info: Impaired Hearing Info: Impaired      SPECIAL CARE FACTORS FREQUENCY  PT (By licensed PT),OT (By licensed OT)     PT Frequency: 5 times per week OT Frequency: 5 times per week            Contractures Contractures Info: Not present    Additional Factors Info  Code Status,Allergies Code Status Info: DNR Allergies Info: NKA            Current Medications (09/11/2020):  This is the current hospital active medication list Current Facility-Administered Medications  Medication Dose Route Frequency Provider Last Rate Last Admin  . acetaminophen (TYLENOL) tablet 650 mg  650 mg Oral Q6H PRN 09/13/2020, MD   650 mg at 09/07/20 2218  . amLODipine (NORVASC) tablet 5 mg  5 mg Oral Daily 2219 A, DO   5 mg at 09/11/20 09/13/20  . aspirin EC tablet 81 mg  81 mg Oral Daily 0973, MD   81 mg at 09/11/20 09/13/20  . azithromycin (ZITHROMAX) 500 mg in sodium chloride 0.9 % 250 mL IVPB  500 mg Intravenous Q24H 5329, RPH 250 mL/hr at 09/10/20 1741 500 mg at 09/10/20 1741  . baricitinib (OLUMIANT) tablet 4 mg  4 mg Oral Daily 09/12/20, Albina Billet   4 mg at 09/11/20 09/13/20  . bisacodyl (DULCOLAX) EC tablet 5 mg  5 mg Oral Daily PRN 9242 A, DO      . bisacodyl (DULCOLAX) suppository 10 mg  10 mg Rectal Daily PRN Esaw Grandchild A, DO      . cefTRIAXone (ROCEPHIN) 1 g in sodium chloride 0.9 % 100 mL IVPB  1 g Intravenous Q24H Esaw Grandchild, RPH 200 mL/hr at 09/10/20 2122 1 g at 09/10/20 2122  . dextromethorphan-guaiFENesin (MUCINEX DM) 30-600 MG per 12 hr tablet 1 tablet  1 tablet Oral BID 2123 A, DO  1 tablet at 09/11/20 0929  . donepezil (ARICEPT) tablet 5 mg  5 mg Oral QHS Lurene Shadow, MD   5 mg at 09/10/20 2111  . enoxaparin (LOVENOX) injection 40 mg  40 mg Subcutaneous Q24H Lurene Shadow, MD   40 mg at 09/10/20 2112  . hydrALAZINE (APRESOLINE) tablet 10 mg  10 mg Oral Q6H PRN Manuela Schwartz, NP      . Ipratropium-Albuterol (COMBIVENT) respimat 1 puff  1 puff Inhalation Q6H WA Griffith, Kelly A, DO   1 puff at 09/11/20 0929  . latanoprost (XALATAN) 0.005 % ophthalmic solution 1 drop  1 drop Both Eyes QHS Esaw Grandchild A, DO   1 drop at 09/10/20 2114  . lisinopril (ZESTRIL) tablet 5 mg  5 mg Oral Daily Lurene Shadow, MD   5 mg at 09/11/20 0931  . MEDLINE mouth rinse  15 mL  Mouth Rinse BID Lurene Shadow, MD   15 mL at 09/11/20 0932  . melatonin tablet 3 mg  3 mg Oral QHS Esaw Grandchild A, DO   3 mg at 09/10/20 2111  . ondansetron (ZOFRAN) tablet 4 mg  4 mg Oral Q6H PRN Lurene Shadow, MD       Or  . ondansetron (ZOFRAN) injection 4 mg  4 mg Intravenous Q6H PRN Lurene Shadow, MD      . polyethylene glycol (MIRALAX / GLYCOLAX) packet 17 g  17 g Oral Daily Esaw Grandchild A, DO   17 g at 09/11/20 0932  . predniSONE (DELTASONE) tablet 30 mg  30 mg Oral Q breakfast Esaw Grandchild A, DO   30 mg at 09/11/20 0932  . senna-docusate (Senokot-S) tablet 1 tablet  1 tablet Oral QHS Esaw Grandchild A, DO   1 tablet at 09/10/20 2111  . tamsulosin (FLOMAX) capsule 0.4 mg  0.4 mg Oral QPC supper Lurene Shadow, MD   0.4 mg at 09/10/20 2111  . timolol (TIMOPTIC) 0.5 % ophthalmic solution 1 drop  1 drop Both Eyes Daily Esaw Grandchild A, DO   1 drop at 09/11/20 0932     Discharge Medications: Please see discharge summary for a list of discharge medications.  Relevant Imaging Results:  Relevant Lab Results:   Additional Information SS# [id number]  Allayne Butcher, RN

## 2020-09-11 NOTE — Progress Notes (Signed)
Physical Therapy Treatment Patient Details Name: David [name] MRN: [id number] DOB: Feb 26, 1927 Today's Date: 09/11/2020    History of Present Illness Pt is a 87 y/o M admitted from home on 09/07/19 with c/c of weakness & cough x 2 days. Pt tested (+) for COVID & chest x ray revealed multifocal pneumonia. PMH: anxiety, RA, dementia, HTN, stroke, CKD stage 3a, R inguinal hernia repair 06/2020    PT Comments    Pt received asleep in bed but easily awakened & agreeable to tx. Pt lethargic throughout session but opens eyes with encouragement but closes them again when not actively doing something. Pt with decreased awareness of situation. Pt with BLE ted hose donned throughout session. Pt requires increased assistance for bed mobility on this date, pt noted to be orthostatic with supine>sit but pt does not actively report symptoms. Pt performs BLE LAQ with cuing for technique for strengthening. Pt unsafe to mobilize without +2 assist 2/2 increased lethargy & worsening sitting balance compared to last time this PT saw him. Pt on 2L/min via nasal cannula during session, SpO2 >90% throughout. Pt returned to bed & is able to roll L<>R with min assist, provided total assist to scoot to [facility]. Pt requesting water & PT elevated HOB for pt to sit upright with max cuing to open eyes while PT assisted him with taking drink. Pt left in bed drifting off to sleep. Nurse made aware of increased lethargy compared to last time PT saw him.   Supine BP 167/55 mmHg (LUE) Sitting EOB BP 118/48 mmHg (LUE)   Follow Up Recommendations  SNF     Equipment Recommendations  None recommended by PT    Recommendations for Other Services       Precautions / Restrictions Precautions Precautions: Fall Restrictions Weight Bearing Restrictions: No Other Position/Activity Restrictions: monitor BP, orthostatic with standing.    Mobility  Bed Mobility Overal bed mobility: Needs Assistance Bed Mobility: Sit to Supine;Supine to  Sit     Supine to sit: Max assist;HOB elevated Sit to supine: Mod assist;HOB elevated   General bed mobility comments: multimodal cuing to initiate supine<>sit, assistance with BLE management & to upright trunk with supine>sit  Transfers                    Ambulation/Gait                 Stairs             Wheelchair Mobility    Modified Rankin (Stroke Patients Only)       Balance Overall balance assessment: Needs assistance Sitting-balance support: Feet supported;Bilateral upper extremity supported Sitting balance-Leahy Scale: Poor Sitting balance - Comments: min assist static sitting balanc EOB Postural control: Posterior lean                                  Cognition Arousal/Alertness: Lethargic Behavior During Therapy: Flat affect Overall Cognitive Status: No family/caregiver present to determine baseline cognitive functioning                                 General Comments: Pt lethargic during session, opening eyes with encouragement but closes them again if not actively doing something. Reports "I'm confused", Asking for water during session.      Exercises General Exercises - Lower Extremity Long Arc Quad: AROM;Right;Left;Strengthening;Seated;5 reps  General Comments        Pertinent Vitals/Pain Pain Assessment: No/denies pain    Home Living                      Prior Function            PT Goals (current goals can now be found in the care plan section) Acute Rehab PT Goals Patient Stated Goal: none stated PT Goal Formulation: With patient Time For Goal Achievement: 09/22/20 Potential to Achieve Goals: Fair Progress towards PT goals: PT to reassess next treatment    Frequency    Min 2X/week      PT Plan Current plan remains appropriate    Co-evaluation              AM-PAC PT "6 Clicks" Mobility   Outcome Measure  Help needed turning from your back to your side  while in a flat bed without using bedrails?: A Lot Help needed moving from lying on your back to sitting on the side of a flat bed without using bedrails?: A Lot Help needed moving to and from a bed to a chair (including a wheelchair)?: Total Help needed standing up from a chair using your arms (e.g., wheelchair or bedside chair)?: Total Help needed to walk in hospital room?: Total Help needed climbing 3-5 steps with a railing? : Total 6 Click Score: 8    End of Session Equipment Utilized During Treatment: Oxygen Activity Tolerance: Patient limited by lethargy Patient left: in bed;with call bell/phone within reach;with bed alarm set Nurse Communication: Mobility status PT Visit Diagnosis: Unsteadiness on feet (R26.81);Difficulty in walking, not elsewhere classified (R26.2);Muscle weakness (generalized) (M62.81);History of falling (Z91.81)     Time: 7616-0737 PT Time Calculation (min) (ACUTE ONLY): 20 min  Charges:  $Therapeutic Activity: 8-22 mins                     Aleda Grana, PT, DPT 09/11/20, 1:05 PM    Sandi Mariscal 09/11/2020, 1:01 PM

## 2020-09-11 NOTE — Progress Notes (Signed)
Occupational Therapy Treatment Patient Details Name: David [name] MRN: [id number] DOB: 04/06/1927 Today's Date: 09/11/2020    History of present illness Pt is a 87 y/o M admitted from home on 09/07/19 with c/c of weakness & cough x 2 days. Pt tested (+) for COVID & chest x ray revealed multifocal pneumonia. PMH: anxiety, RA, dementia, HTN, stroke, CKD stage 3a, R inguinal hernia repair 06/2020   OT comments  Pt seen for OT tx this date. Pt lightly sleeping, wakes easily and engaged with therapist. MOD A for scooting up in bed with cues for hand placement to improve his successful participation. Once in long sitting in bed, set up for washing face with washcloth. Pt endorses his eyes were "burning" and "dry". RN notified at end of session. Pt set up with water for drinking from straw with pt ultimately attempting to drink too quickly, requiring therapist to hold cup and provide max verbal cues to moderate intake so to minimize risk of aspiration. Pt briefly clearing his throat slightly afterwards when drinking too quickly but fine once taking 1 sip at a time. Pt continues to benefit from skilled OT services. Continue to recommend SNF at this time.    Follow Up Recommendations  SNF    Equipment Recommendations  3 in 1 bedside commode    Recommendations for Other Services      Precautions / Restrictions Precautions Precautions: Fall Restrictions Weight Bearing Restrictions: No Other Position/Activity Restrictions: monitor BP, orthostatic with standing.       Mobility Bed Mobility Overal bed mobility: Needs Assistance Bed Mobility: Sit to Supine;Supine to Sit     Supine to sit: Max assist;HOB elevated Sit to supine: Mod assist;HOB elevated   General bed mobility comments: Mod A for scooting up in bed with pt providing good effort to assist  Transfers                      Balance Overall balance assessment: Needs assistance Sitting-balance support: Feet  supported;Bilateral upper extremity supported Sitting balance-Leahy Scale: Poor Sitting balance - Comments: min assist static sitting balanc EOB Postural control: Posterior lean                                 ADL either performed or assessed with clinical judgement   ADL                                         General ADL Comments: long sitting in bed, set up for drinking from straw with pt ultimately attempting to drink too quickly, requiring therapist to hold cup and provide max verbal cues to moderate intake so to minimize risk of aspiration; set up for washing face     Vision Patient Visual Report: No change from baseline     Perception     Praxis      Cognition Arousal/Alertness: Awake/alert Behavior During Therapy: Flat affect Overall Cognitive Status: No family/caregiver present to determine baseline cognitive functioning                                 General Comments: initially asleep, wakes easily and engages with therapist, decreased safety with self drinking        Exercises General Exercises - Lower  Extremity Long Arc Quad: AROM;Right;Left;Strengthening;Seated;5 reps   Shoulder Instructions       General Comments      Pertinent Vitals/ Pain       Pain Assessment: No/denies pain  Home Living                                          Prior Functioning/Environment              Frequency  Min 1X/week        Progress Toward Goals  OT Goals(current goals can now be found in the care plan section)  Progress towards OT goals: OT to reassess next treatment  Acute Rehab OT Goals Patient Stated Goal: none stated OT Goal Formulation: With patient Time For Goal Achievement: 09/22/20 Potential to Achieve Goals: Good  Plan Discharge plan remains appropriate;Frequency remains appropriate    Co-evaluation                 AM-PAC OT "6 Clicks" Daily Activity     Outcome  Measure   Help from another person eating meals?: A Little Help from another person taking care of personal grooming?: A Little Help from another person toileting, which includes using toliet, bedpan, or urinal?: A Lot Help from another person bathing (including washing, rinsing, drying)?: A Lot Help from another person to put on and taking off regular upper body clothing?: A Little Help from another person to put on and taking off regular lower body clothing?: A Lot 6 Click Score: 15    End of Session    OT Visit Diagnosis: Unsteadiness on feet (R26.81);Muscle weakness (generalized) (M62.81)   Activity Tolerance Patient tolerated treatment well   Patient Left in bed;with call bell/phone within reach;with bed alarm set   Nurse Communication          Time: 1351-1405 OT Time Calculation (min): 14 min  Charges: OT General Charges $OT Visit: 1 Visit OT Treatments $Self Care/Home Management : 8-22 mins  Richrd Prime, MPH, MS, OTR/L ascom [phone] 09/11/20, 3:51 PM

## 2020-09-12 LAB — FIBRIN DERIVATIVES D-DIMER (ARMC ONLY): Fibrin derivatives D-dimer (ARMC): 1214.53 ng/mL (FEU) — ABNORMAL HIGH (ref 0.00–499.00)

## 2020-09-12 LAB — C-REACTIVE PROTEIN: CRP: 1.8 mg/dL — ABNORMAL HIGH (ref <1.0)

## 2020-09-12 LAB — BRAIN NATRIURETIC PEPTIDE: B Natriuretic Peptide: 84.7 pg/mL (ref 0.0–100.0)

## 2020-09-12 MED ORDER — LISINOPRIL 5 MG PO TABS: 5.0000 mg | ORAL_TABLET | Freq: Every day | ORAL | Status: DC

## 2020-09-12 MED ORDER — IPRATROPIUM-ALBUTEROL 20-100 MCG/ACT IN AERS: 1.0000 | INHALATION_SPRAY | Freq: Four times a day (QID) | RESPIRATORY_TRACT | Status: AC

## 2020-09-12 MED ORDER — ACETAMINOPHEN 325 MG PO TABS: 650.0000 mg | ORAL_TABLET | Freq: Four times a day (QID) | ORAL | Status: AC | PRN

## 2020-09-12 MED ORDER — MELATONIN 3 MG PO TABS: 3.0000 mg | ORAL_TABLET | Freq: Every day | ORAL | 0 refills | Status: AC

## 2020-09-12 MED ORDER — ASPIRIN EC 81 MG PO TBEC: 81.0000 mg | DELAYED_RELEASE_TABLET | Freq: Every day | ORAL | Status: DC

## 2020-09-12 MED ORDER — POLYETHYLENE GLYCOL 3350 17 G PO PACK: 17.0000 g | PACK | Freq: Every day | ORAL | 0 refills | Status: DC

## 2020-09-12 MED ORDER — BISACODYL 5 MG PO TBEC: 5.0000 mg | DELAYED_RELEASE_TABLET | Freq: Every day | ORAL | 0 refills | Status: AC | PRN

## 2020-09-12 MED ORDER — BISACODYL 10 MG RE SUPP: 10.0000 mg | Freq: Every day | RECTAL | 0 refills | Status: DC | PRN

## 2020-09-12 MED ORDER — TAMSULOSIN HCL 0.4 MG PO CAPS: 0.4000 mg | ORAL_CAPSULE | Freq: Every day | ORAL | Status: AC

## 2020-09-12 MED ORDER — DONEPEZIL HCL 5 MG PO TABS: 5.0000 mg | ORAL_TABLET | Freq: Every day | ORAL | Status: AC

## 2020-09-12 MED ORDER — PREDNISONE 10 MG PO TABS: ORAL_TABLET | ORAL | 0 refills | Status: AC

## 2020-09-12 MED ORDER — SENNOSIDES-DOCUSATE SODIUM 8.6-50 MG PO TABS: 1.0000 | ORAL_TABLET | Freq: Every day | ORAL | Status: AC

## 2020-09-12 MED ORDER — DM-GUAIFENESIN ER 30-600 MG PO TB12: 1.0000 | ORAL_TABLET | Freq: Two times a day (BID) | ORAL | 0 refills | Status: DC

## 2020-09-12 NOTE — Discharge Summary (Addendum)
Physician Discharge Summary  David [name] JKQ:[id number] DOB: 27-Jul-1927 DOA: 09/06/2020  PCP: [facility]  Admit date: 09/06/2020 Discharge date: 09/12/2020  Admitted From: home Disposition:  SNF  Recommendations for Outpatient Follow-up:  1. Follow up with PCP in 1-2 weeks 2. Please obtain BMP/CBC in one week  Home Health: No  Equipment/Devices: none    Discharge Condition: Stable  CODE STATUS: DNR   Diet recommendation:  Heart Healthy     Discharge Diagnoses: Principal Problem:   Pneumonia due to COVID-19 virus Active Problems:   Acute hypoxemic respiratory failure (HCC)    Summary of HPI and Hospital Course:  David [name] is a 87 y.o. male with medical history significant for anxiety, arthritis, dementia, HTN, stroke, b/l carotid stenosis, CKD stage III a, recent right inguinal hernia repair November 2021.  He presented to the ED on 09/06/2020 with profound generalized weakness, confusion and cough for past 2 days, per report of patient's daughter-in-law at time of admission.  EMS was called, they found patient hypoxic with spO2 88% on room air.  In the ED, patient tested positive for COVID-19 infection and chest x-ray showed multifocal pneumonia.    Acute respiratory failure with hypoxia secondary to COVID-19 pneumonia and CAP -present on admission. Presented with cough, weakness and confusion, x-ray showing multifocal pneumonia and COVID-19 positive, O2 sat was 88% on room air with EMS.  Initially required 2 to 3 L/min supplemental oxygen. Also with leukocytosis, elevated Pro-Cal concerning for secondary bacterial pneumonia. CRP and other inflammatory markers trending down. -- Continue Prednisone taper over next 4 days, 20 mg x 2 days, 10 mg x 2 days. -- Completed  5 days remdesivir -- Treated with baricitinib during admission -- Completed 5 days Rocephin and Zithromax  for possible secondary bacterial pneumonia -- Mucinex twice daily -- Combivent q6h as  needed, albuterol inhaler as needed -- Oxygen as needed to keep O2 sat above 88%, wean as tolerated Patient has been requiring 2 L/min nasal cannula oxygen.  Hypernatremia /dehydration -present on admission with sodium 147.  Resolved with D5W.    Encourage oral hydration.  Orthostatic hypotension - with baseline hypertension.  With PT/OT patient's BP sitting 123/62, standing 93/65.  Recommend TED hose.  Encourage hydration.  May need to reduce his antihypertensives and follow-up.    Use caution when getting up from laying or standing.  Constipation - bowel regimen per orders.  Enemas if needed.  History of stroke -continue aspirin  History of carotid stenosis -no acute issues.  Aspirin as above  Essential hypertension -continue amlodipine, lisinopril.  PRN hydralazine.  Has elevated systolic BP's, but diastolic is frequent low in 40's-50's.    Orthostatic vitals were positive.  Monitor BP closely.   AKI superimposed on CKD stage IIIa - Resolved, renal function at baseline.  Presented with creatinine 1.58, improved to 1.15 after IV hydration in the ED.   AKI was likely due to dehydration and prerenal azotemia.   Follow-up BMP in 1 week  BPH -continue Flomax  Recent right inguinal hernia repair -in November 2021.  No acute issues.  Monitor    Discharge Instructions   Discharge Instructions    Call MD for:   Complete by: As directed    Worsening or persistent shortness of breath, or having to use more oxygen to maintain saturation level at or above 88%   Call MD for:  extreme fatigue   Complete by: As directed    Call MD for:  persistant  dizziness or light-headedness   Complete by: As directed    Call MD for:  persistant nausea and vomiting   Complete by: As directed    Call MD for:  severe uncontrolled pain   Complete by: As directed    Call MD for:  temperature >100.4   Complete by: As directed    Diet - low sodium heart healthy   Complete by: As directed     Increase activity slowly   Complete by: As directed      Allergies as of 09/12/2020   No Known Allergies     Medication List    TAKE these medications   acetaminophen 325 MG tablet Commonly known as: TYLENOL Take 2 tablets (650 mg total) by mouth every 6 (six) hours as needed for mild pain or headache (fever >/= 101).   amLODipine 5 MG tablet Commonly known as: NORVASC Take 5 mg by mouth daily.   aspirin EC 81 MG tablet Take 1 tablet (81 mg total) by mouth daily. What changed:   how much to take  when to take this   bisacodyl 5 MG EC tablet Commonly known as: DULCOLAX Take 1 tablet (5 mg total) by mouth daily as needed for moderate constipation.   bisacodyl 10 MG suppository Commonly known as: DULCOLAX Place 1 suppository (10 mg total) rectally daily as needed for severe constipation.   dextromethorphan-guaiFENesin 30-600 MG 12hr tablet Commonly known as: MUCINEX DM Take 1 tablet by mouth 2 (two) times daily for 7 days.   donepezil 5 MG tablet Commonly known as: ARICEPT Take 1 tablet (5 mg total) by mouth at bedtime.   Ipratropium-Albuterol 20-100 MCG/ACT Aers respimat Commonly known as: COMBIVENT Inhale 1 puff into the lungs every 6 (six) hours.   latanoprost 0.005 % ophthalmic solution Commonly known as: XALATAN Place 1 drop into both eyes at bedtime.   lisinopril 5 MG tablet Commonly known as: ZESTRIL Take 1 tablet (5 mg total) by mouth daily.   melatonin 3 MG Tabs tablet Take 1 tablet (3 mg total) by mouth at bedtime.   polyethylene glycol 17 g packet Commonly known as: MIRALAX / GLYCOLAX Take 17 g by mouth daily. Hold if frequent BM's or loose stools Start taking on: September 13, 2020   predniSONE 10 MG tablet Commonly known as: DELTASONE Take 2 tablets (20 mg total) by mouth daily for 2 days, THEN 1 tablet (10 mg total) daily for 2 days. Start taking on: September 12, 2020   senna-docusate 8.6-50 MG tablet Commonly known as: Senokot-S Take 1  tablet by mouth at bedtime. Hold if frequent BM's or loose stools   tamsulosin 0.4 MG Caps capsule Commonly known as: FLOMAX Take 1 capsule (0.4 mg total) by mouth daily after supper. What changed: when to take this   timolol 0.5 % ophthalmic solution Commonly known as: TIMOPTIC Place 1 drop into both eyes daily.       Contact information for after-discharge care    Destination    [facility] .   Service: Skilled Nursing Contact information: [address] [place] [address] [phone]                 No Known Allergies  Consultations:  None   Procedures/Studies: DG Chest Portable 1 View  Result Date: 09/06/2020 CLINICAL DATA:  Hypoxia.  Weakness. EXAM: PORTABLE CHEST 1 VIEW COMPARISON:  08/08/2016 FINDINGS: Normal heart size. Aortic atherosclerosis. Bilateral airspace opacities are identified, most notable within the  right upper lobe and left lower lobe. No pleural effusion or edema. IMPRESSION: Bilateral airspace opacities compatible with multifocal infection. Followup PA and lateral chest X-ray is recommended in 3-4 weeks following trial of antibiotic therapy to ensure resolution and exclude underlying malignancy. Electronically Signed   By: Signa Kell M.D.   On: 09/06/2020 12:01       Subjective: Patient seen awake laying in bed today.  Reports he is feeling well overall.  States cough improved.  No nausea vomiting, fever chills, shortness of breath, chest pain or other acute complaints   Discharge Exam: Vitals:   09/12/20 0831 09/12/20 1125  BP: (!) 122/42 (!) 119/48  Pulse: 71 66  Resp: 18 16  Temp: (!) 96.5 F (35.8 C) (!) 97 F (36.1 C)  SpO2: 95% 93%   Vitals:   09/12/20 0610 09/12/20 0829 09/12/20 0831 09/12/20 1125  BP: 128/66 (!) 94/54 (!) 122/42 (!) 119/48  Pulse: 69 66 71 66  Resp:  Temp: 98 F (36.7 C) (!) 96.5 F (35.8 C) (!) 96.5 F (35.8 C) (!) 97 F (36.1 C)  TempSrc:       SpO2: 96% 96% 95% 93%  Weight:      Height:        General: Pt is alert, awake, not in acute distress Cardiovascular: RRR, S1/S2 +, no rubs, no gallops Respiratory: CTA bilaterally, no wheezing, no rhonchi Abdominal: Soft, NT, ND, bowel sounds + Extremities: no edema, no cyanosis    The results of significant diagnostics from this hospitalization (including imaging, microbiology, ancillary and laboratory) are listed below for reference.     Microbiology: No results found for this or any previous visit (from the past 240 hour(s)).   Labs: BNP (last 3 results) Recent Labs    09/12/20 0344  BNP 84.7   Basic Metabolic Panel: Recent Labs  Lab 09/07/20 0556 09/08/20 0355 09/09/20 0459 09/10/20 0449 09/11/20 0710  NA 146* 144 141 145 142  K 4.1 4.1 4.3 4.7 4.6  CL 110 107 107 111 108  CO2 GLUCOSE 132* 150* 152* 110* 113*  BUN 50* 59* 55* 47* 48*  CREATININE 1.15 1.11 1.02 1.02 1.08  CALCIUM 9.4 9.2 8.8* 8.9 8.8*  MG 2.2 2.1 2.0 2.1  --   PHOS 3.2 2.9 3.5 3.8  --    Liver Function Tests: Recent Labs  Lab 09/07/20 0556 09/08/20 0355 09/09/20 0459 09/10/20 0449 09/11/20 0710  AST ALT ALKPHOS 43 46 41 40 40  BILITOT 0.8 0.5 0.6 0.6 0.5  PROT 6.8 6.7 6.2* 5.9* 6.0*  ALBUMIN 2.4* 2.5* 2.2* 2.2* 2.2*   No results for input(s): LIPASE, AMYLASE in the last 168 hours. No results for input(s): AMMONIA in the last 168 hours. CBC: Recent Labs  Lab 09/06/20 1136 09/07/20 0556 09/08/20 0355 09/09/20 0459 09/10/20 0449  WBC 16.7* 13.9* 11.0* 7.1 7.5  NEUTROABS 15.5* 12.6* 9.5* 5.4 5.8  HGB 12.5* 12.9* 12.6* 12.2* 12.0*  HCT 39.9 40.4 40.6 37.9* 37.9*  MCV 86.0 84.3 85.8 83.7 85.0  PLT 196 196 207 220 228   Cardiac Enzymes: No results for input(s): CKTOTAL, CKMB, CKMBINDEX, TROPONINI in the last 168 hours. BNP: Invalid input(s): POCBNP CBG: Recent Labs  Lab 09/06/20 2056  GLUCAP 208*   D-Dimer No results  for input(s): DDIMER in the last 72 hours. Hgb A1c No results for input(s):  HGBA1C in the last 72 hours. Lipid Profile No results for input(s): CHOL, HDL, LDLCALC, TRIG, CHOLHDL, LDLDIRECT in the last 72 hours. Thyroid function studies No results for input(s): TSH, T4TOTAL, T3FREE, THYROIDAB in the last 72 hours.  Invalid input(s): FREET3 Anemia work up Recent Labs    09/10/20 0449 09/11/20 0710  FERRITIN 107 99   Urinalysis    Component Value Date/Time   COLORURINE YELLOW (A) 09/06/2020 1828   APPEARANCEUR CLEAR (A) 09/06/2020 1828   APPEARANCEUR Clear 09/26/2013 1256   LABSPEC 1.024 09/06/2020 1828   LABSPEC 1.015 09/26/2013 1256   PHURINE 5.0 09/06/2020 1828   GLUCOSEU NEGATIVE 09/06/2020 1828   GLUCOSEU Negative 09/26/2013 1256   HGBUR SMALL (A) 09/06/2020 1828   BILIRUBINUR NEGATIVE 09/06/2020 1828   BILIRUBINUR Negative 09/26/2013 1256   KETONESUR NEGATIVE 09/06/2020 1828   PROTEINUR 100 (A) 09/06/2020 1828   NITRITE NEGATIVE 09/06/2020 1828   LEUKOCYTESUR NEGATIVE 09/06/2020 1828   LEUKOCYTESUR Negative 09/26/2013 1256   Sepsis Labs Invalid input(s): PROCALCITONIN,  WBC,  LACTICIDVEN Microbiology No results found for this or any previous visit (from the past 240 hour(s)).   Time coordinating discharge: Over 30 minutes  SIGNED:   Pennie Banter, DO [facility] 09/12/2020, 1:49 PM   If 7PM-7AM, please contact night-coverage [email or web address]

## 2020-09-12 NOTE — TOC Progression Note (Signed)
Transition of Care [facility]) - Progression Note    Patient Details  Name: David [name] MRN: [id number] Date of Birth: 1927-07-11  [facility] CM/SW Contact  Allayne Butcher, RN Phone Number: 09/12/2020, 10:20 AM  Clinical Narrative:    Bed offer from [facility] on the hub but they cannot accept the patient until he is out 10 days from COVID diagnosis which will be on 1/22.  Bed search expanded to see if any other facilities can accept patient sooner.    Expected Discharge Plan: Skilled Nursing Facility Barriers to Discharge: SNF Covid  Expected Discharge Plan and Services Expected Discharge Plan: Skilled Nursing Facility   Discharge Planning Services: CM Consult Post Acute Care Choice: Skilled Nursing Facility Living arrangements for the past 2 months: Single Family Home                 DME Arranged: N/A DME Agency: NA       HH Arranged: NA           Social Determinants of Health (SDOH) Interventions    Readmission Risk Interventions No flowsheet data found.

## 2020-09-12 NOTE — Care Management Important Message (Signed)
Important Message  Patient Details  Name: EWAN GRAU MRN: [id number] Date of Birth: 08-16-1927   Medicare Important Message Given:  Yes     Allayne Butcher, RN 09/12/2020, 10:19 AM

## 2020-09-12 NOTE — TOC Transition Note (Signed)
Transition of Care [facility]) - CM/SW Discharge Note   Patient Details  Name: David [name] MRN: [id number] Date of Birth: 04/02/1927  Transition of Care [facility]) CM/SW Contact:  Allayne Butcher, RN Phone Number: 09/12/2020, 2:29 PM   Clinical Narrative:    Patient medically cleared for discharge to [facility] today.  Patient will be going to room 1007P.  Bedside RN will call report to [phone].  [facility] has been arranged.  There are currently 4 transports ahead of patient.    Patient's son agrees to [facility] [employer] and is updated on discharge for today.     Final next level of care: Skilled Nursing Facility Barriers to Discharge: Barriers Resolved   Patient Goals and CMS Choice Patient states their goals for this hospitalization and ongoing recovery are:: Patient's son agrees for patient to go to [employer]gov Compare Post Acute Care list provided to:: Patient Represenative (must comment) Choice offered to / list presented to : Adult Children  Discharge Placement   Existing PASRR number confirmed : 09/11/20          Patient [facility] bed at: [facility] Patient to be transferred to facility by: [facility] Name of family member notified: Minerva Areola Patient and family notified of of transfer: 09/12/20  Discharge Plan and Services   Discharge Planning Services: CM Consult Post Acute Care Choice: Skilled Nursing Facility          DME Arranged: N/A DME Agency: NA       HH Arranged: NA          Social Determinants of Health (SDOH) Interventions     Readmission Risk Interventions No flowsheet data found.

## 2020-09-12 NOTE — Progress Notes (Signed)
Report called to [facility] nurse at [facility].  AVS and d/c summary in packet to be sent with patient.

## 2020-09-12 NOTE — Progress Notes (Signed)
   09/12/20 0831  Assess: MEWS Score  Temp (!) 96.5 F (35.8 C)  BP (!) 122/42  Pulse Rate 71  Resp 18  Level of Consciousness Alert  SpO2 95 %  O2 Device Nasal Cannula  Patient Activity (if Appropriate) In bed  O2 Flow Rate (L/min) 2 L/min  Assess: MEWS Score  MEWS Temp 1  MEWS Systolic 0  MEWS Pulse 0  MEWS RR 0  MEWS LOC 0  MEWS Score 1  MEWS Score Color Green  Assess: if the MEWS score is Yellow or Red  Were vital signs taken at a resting state? Yes  Focused Assessment No change from prior assessment  Early Detection of Sepsis Score *See Row Information* Low  MEWS guidelines implemented *See Row Information* Yes  Treat  MEWS Interventions Escalated (See documentation below)  Pain Scale 0-10  Pain Score 0  Take Vital Signs  Increase Vital Sign Frequency  Yellow: Q 2hr X 2 then Q 4hr X 2, if remains yellow, continue Q 4hrs  Escalate  MEWS: Escalate Yellow: discuss with charge nurse/RN and consider discussing with provider and RRT  Notify: Charge Nurse/RN  Name of Charge Nurse/RN Notified Ashely RN  Date Charge Nurse/RN Notified 09/12/20  Time Charge Nurse/RN Notified 0900  Document  Patient Outcome Stabilized after interventions  Progress note created (see row info) Yes

## 2020-09-18 ENCOUNTER — Other Ambulatory Visit: Payer: Self-pay

## 2020-09-18 ENCOUNTER — Emergency Department: Payer: Medicare Other

## 2020-09-18 ENCOUNTER — Inpatient Hospital Stay
Admission: EM | Admit: 2020-09-18 | Discharge: 2020-09-29 | DRG: 377 | Disposition: A | Discharge status: Hospice | Payer: Medicare Other | Attending: Internal Medicine | Admitting: Internal Medicine

## 2020-09-18 ENCOUNTER — Encounter: Payer: Self-pay | Admitting: Radiology

## 2020-09-18 DIAGNOSIS — R6511 Systemic inflammatory response syndrome (SIRS) of non-infectious origin with acute organ dysfunction: Secondary | ICD-10-CM | POA: Diagnosis present

## 2020-09-18 DIAGNOSIS — Z8673 Personal history of transient ischemic attack (TIA), and cerebral infarction without residual deficits: Secondary | ICD-10-CM | POA: Diagnosis not present

## 2020-09-18 DIAGNOSIS — U071 COVID-19: Secondary | ICD-10-CM | POA: Diagnosis not present

## 2020-09-18 DIAGNOSIS — N3289 Other specified disorders of bladder: Secondary | ICD-10-CM | POA: Diagnosis present

## 2020-09-18 DIAGNOSIS — Z7982 Long term (current) use of aspirin: Secondary | ICD-10-CM | POA: Diagnosis not present

## 2020-09-18 DIAGNOSIS — Z7189 Other specified counseling: Secondary | ICD-10-CM | POA: Diagnosis not present

## 2020-09-18 DIAGNOSIS — N179 Acute kidney failure, unspecified: Secondary | ICD-10-CM | POA: Diagnosis present

## 2020-09-18 DIAGNOSIS — J1282 Pneumonia due to coronavirus disease 2019: Secondary | ICD-10-CM | POA: Diagnosis not present

## 2020-09-18 DIAGNOSIS — K449 Diaphragmatic hernia without obstruction or gangrene: Secondary | ICD-10-CM | POA: Diagnosis present

## 2020-09-18 DIAGNOSIS — E872 Acidosis: Secondary | ICD-10-CM | POA: Diagnosis present

## 2020-09-18 DIAGNOSIS — I10 Essential (primary) hypertension: Secondary | ICD-10-CM | POA: Diagnosis present

## 2020-09-18 DIAGNOSIS — I1310 Hypertensive heart and chronic kidney disease without heart failure, with stage 1 through stage 4 chronic kidney disease, or unspecified chronic kidney disease: Secondary | ICD-10-CM | POA: Diagnosis present

## 2020-09-18 DIAGNOSIS — Z66 Do not resuscitate: Secondary | ICD-10-CM | POA: Diagnosis present

## 2020-09-18 DIAGNOSIS — Z79899 Other long term (current) drug therapy: Secondary | ICD-10-CM | POA: Diagnosis not present

## 2020-09-18 DIAGNOSIS — D62 Acute posthemorrhagic anemia: Secondary | ICD-10-CM | POA: Diagnosis present

## 2020-09-18 DIAGNOSIS — E870 Hyperosmolality and hypernatremia: Secondary | ICD-10-CM | POA: Diagnosis present

## 2020-09-18 DIAGNOSIS — Z515 Encounter for palliative care: Secondary | ICD-10-CM

## 2020-09-18 DIAGNOSIS — E860 Dehydration: Secondary | ICD-10-CM | POA: Diagnosis present

## 2020-09-18 DIAGNOSIS — R0902 Hypoxemia: Secondary | ICD-10-CM | POA: Diagnosis present

## 2020-09-18 DIAGNOSIS — Z8616 Personal history of COVID-19: Secondary | ICD-10-CM | POA: Diagnosis not present

## 2020-09-18 DIAGNOSIS — K5900 Constipation, unspecified: Secondary | ICD-10-CM | POA: Diagnosis present

## 2020-09-18 DIAGNOSIS — E861 Hypovolemia: Secondary | ICD-10-CM | POA: Diagnosis present

## 2020-09-18 DIAGNOSIS — F0390 Unspecified dementia without behavioral disturbance: Secondary | ICD-10-CM | POA: Diagnosis present

## 2020-09-18 DIAGNOSIS — E876 Hypokalemia: Secondary | ICD-10-CM | POA: Diagnosis present

## 2020-09-18 DIAGNOSIS — Z87891 Personal history of nicotine dependence: Secondary | ICD-10-CM

## 2020-09-18 DIAGNOSIS — K922 Gastrointestinal hemorrhage, unspecified: Secondary | ICD-10-CM

## 2020-09-18 DIAGNOSIS — N132 Hydronephrosis with renal and ureteral calculous obstruction: Secondary | ICD-10-CM | POA: Diagnosis present

## 2020-09-18 DIAGNOSIS — N183 Chronic kidney disease, stage 3 unspecified: Secondary | ICD-10-CM | POA: Diagnosis present

## 2020-09-18 DIAGNOSIS — K921 Melena: Secondary | ICD-10-CM | POA: Diagnosis present

## 2020-09-18 DIAGNOSIS — D649 Anemia, unspecified: Secondary | ICD-10-CM

## 2020-09-18 LAB — GASTROINTESTINAL PANEL BY PCR, STOOL (REPLACES STOOL CULTURE)

## 2020-09-18 LAB — PROTIME-INR
INR: 1.2 (ref 0.8–1.2)
Prothrombin Time: 14.3 seconds (ref 11.4–15.2)

## 2020-09-18 LAB — CBC WITH DIFFERENTIAL/PLATELET
Abs Immature Granulocytes: 0 10*3/uL (ref 0.00–0.07)
Basophils Absolute: 0 10*3/uL (ref 0.0–0.1)
Basophils Relative: 0 %
Eosinophils Absolute: 0 10*3/uL (ref 0.0–0.5)
Eosinophils Relative: 0 %
HCT: 19.9 % — ABNORMAL LOW (ref 39.0–52.0)
Hemoglobin: 6 g/dL — ABNORMAL LOW (ref 13.0–17.0)
Lymphocytes Relative: 3 %
Lymphs Abs: 0.8 10*3/uL (ref 0.7–4.0)
MCH: 27.1 pg (ref 26.0–34.0)
MCHC: 30.2 g/dL (ref 30.0–36.0)
MCV: 90 fL (ref 80.0–100.0)
Monocytes Absolute: 0.8 10*3/uL (ref 0.1–1.0)
Monocytes Relative: 3 %
Neutro Abs: 24.9 10*3/uL — ABNORMAL HIGH (ref 1.7–7.7)
Neutrophils Relative %: 94 %
Platelets: 420 10*3/uL — ABNORMAL HIGH (ref 150–400)
RBC: 2.21 MIL/uL — ABNORMAL LOW (ref 4.22–5.81)
RDW: 16.7 % — ABNORMAL HIGH (ref 11.5–15.5)
WBC: 26.5 10*3/uL — ABNORMAL HIGH (ref 4.0–10.5)
nRBC: 0 % (ref 0.0–0.2)

## 2020-09-18 LAB — URINALYSIS, COMPLETE (UACMP) WITH MICROSCOPIC
Bilirubin Urine: NEGATIVE
Glucose, UA: NEGATIVE mg/dL
Ketones, ur: NEGATIVE mg/dL
Leukocytes,Ua: NEGATIVE
Nitrite: NEGATIVE
Protein, ur: NEGATIVE mg/dL
Specific Gravity, Urine: 1.015 (ref 1.005–1.030)
pH: 5 (ref 5.0–8.0)

## 2020-09-18 LAB — COMPREHENSIVE METABOLIC PANEL
ALT: 17 U/L (ref 0–44)
AST: 38 U/L (ref 15–41)
Albumin: 2.2 g/dL — ABNORMAL LOW (ref 3.5–5.0)
Alkaline Phosphatase: 39 U/L (ref 38–126)
Anion gap: 13 (ref 5–15)
BUN: 76 mg/dL — ABNORMAL HIGH (ref 8–23)
CO2: 20 mmol/L — ABNORMAL LOW (ref 22–32)
Calcium: 8.7 mg/dL — ABNORMAL LOW (ref 8.9–10.3)
Chloride: 115 mmol/L — ABNORMAL HIGH (ref 98–111)
Creatinine, Ser: 1.88 mg/dL — ABNORMAL HIGH (ref 0.61–1.24)
GFR, Estimated: 33 mL/min — ABNORMAL LOW (ref >60)
Glucose, Bld: 163 mg/dL — ABNORMAL HIGH (ref 70–99)
Potassium: 4.1 mmol/L (ref 3.5–5.1)
Sodium: 148 mmol/L — ABNORMAL HIGH (ref 135–145)
Total Bilirubin: 0.6 mg/dL (ref 0.3–1.2)
Total Protein: 5.9 g/dL — ABNORMAL LOW (ref 6.5–8.1)

## 2020-09-18 LAB — LACTIC ACID, PLASMA
Lactic Acid, Venous: 2.9 mmol/L (ref 0.5–1.9)
Lactic Acid, Venous: 1.6 mmol/L (ref 0.5–1.9)

## 2020-09-18 LAB — C DIFFICILE (CDIFF) QUICK SCRN (NO PCR REFLEX)
C Diff antigen: NEGATIVE
C Diff interpretation: NOT DETECTED
C Diff toxin: NEGATIVE

## 2020-09-18 LAB — PREPARE RBC (CROSSMATCH)

## 2020-09-18 LAB — ABO/RH: ABO/RH(D): O POS

## 2020-09-18 LAB — TROPONIN I (HIGH SENSITIVITY)
Troponin I (High Sensitivity): 43 ng/L — ABNORMAL HIGH (ref <18)
Troponin I (High Sensitivity): 36 ng/L — ABNORMAL HIGH (ref <18)

## 2020-09-18 LAB — PROCALCITONIN: Procalcitonin: <0.1 ng/mL

## 2020-09-18 LAB — APTT: aPTT: 34 seconds (ref 24–36)

## 2020-09-18 LAB — BRAIN NATRIURETIC PEPTIDE: B Natriuretic Peptide: 70.9 pg/mL (ref 0.0–100.0)

## 2020-09-18 MED ORDER — SODIUM CHLORIDE 0.9 % IV SOLN
2.0000 g | INTRAVENOUS | Status: DC
  Administered 2020-09-19: 2 g via INTRAVENOUS
  Filled 2020-09-18 (×2): qty 2

## 2020-09-18 MED ORDER — IOHEXOL 350 MG/ML SOLN
60.0000 mL | Freq: Once | INTRAVENOUS | Status: AC | PRN
  Administered 2020-09-18: 60 mL via INTRAVENOUS

## 2020-09-18 MED ORDER — METRONIDAZOLE IN NACL 5-0.79 MG/ML-% IV SOLN
500.0000 mg | Freq: Three times a day (TID) | INTRAVENOUS | Status: DC
  Administered 2020-09-19 – 2020-09-20 (×5): 500 mg via INTRAVENOUS
  Filled 2020-09-18 (×9): qty 100

## 2020-09-18 MED ORDER — SODIUM CHLORIDE 0.9 % IV BOLUS
1000.0000 mL | Freq: Once | INTRAVENOUS | Status: AC
  Administered 2020-09-18: 1000 mL via INTRAVENOUS

## 2020-09-18 MED ORDER — SODIUM CHLORIDE 0.9 % IV SOLN
2.0000 g | Freq: Once | INTRAVENOUS | Status: AC
  Administered 2020-09-18: 2 g via INTRAVENOUS
  Filled 2020-09-18: qty 2

## 2020-09-18 MED ORDER — VANCOMYCIN HCL 750 MG/150ML IV SOLN
750.0000 mg | Freq: Once | INTRAVENOUS | Status: AC
  Administered 2020-09-18: 750 mg via INTRAVENOUS
  Filled 2020-09-18: qty 150

## 2020-09-18 MED ORDER — ONDANSETRON HCL 4 MG/2ML IJ SOLN: 4.0000 mg | Freq: Four times a day (QID) | INTRAMUSCULAR | Status: DC | PRN

## 2020-09-18 MED ORDER — SODIUM CHLORIDE 0.9 % IV SOLN
8.0000 mg/h | INTRAVENOUS | Status: AC
  Administered 2020-09-18 – 2020-09-21 (×5): 8 mg/h via INTRAVENOUS
  Filled 2020-09-18 (×7): qty 80

## 2020-09-18 MED ORDER — ONDANSETRON HCL 4 MG PO TABS: 4.0000 mg | ORAL_TABLET | Freq: Four times a day (QID) | ORAL | Status: DC | PRN

## 2020-09-18 MED ORDER — METRONIDAZOLE IN NACL 5-0.79 MG/ML-% IV SOLN
500.0000 mg | Freq: Once | INTRAVENOUS | Status: AC
  Administered 2020-09-18: 500 mg via INTRAVENOUS
  Filled 2020-09-18: qty 100

## 2020-09-18 MED ORDER — VANCOMYCIN HCL IN DEXTROSE 1-5 GM/200ML-% IV SOLN
1000.0000 mg | Freq: Once | INTRAVENOUS | Status: AC
  Administered 2020-09-18: 1000 mg via INTRAVENOUS
  Filled 2020-09-18: qty 200

## 2020-09-18 MED ORDER — VANCOMYCIN HCL 1250 MG/250ML IV SOLN
1250.0000 mg | INTRAVENOUS | Status: DC
  Filled 2020-09-18: qty 250

## 2020-09-18 MED ORDER — SODIUM CHLORIDE 0.9 % IV SOLN
10.0000 mL/h | Freq: Once | INTRAVENOUS | Status: AC
  Administered 2020-09-18: 10 mL/h via INTRAVENOUS

## 2020-09-18 MED ORDER — PANTOPRAZOLE SODIUM 40 MG IV SOLR
40.0000 mg | Freq: Two times a day (BID) | INTRAVENOUS | Status: DC
  Administered 2020-09-22 – 2020-09-29 (×14): 40 mg via INTRAVENOUS
  Filled 2020-09-18 (×16): qty 40

## 2020-09-18 MED ORDER — SODIUM CHLORIDE 0.9 % IV SOLN
80.0000 mg | Freq: Once | INTRAVENOUS | Status: AC
  Administered 2020-09-18: 21:00:00 80 mg via INTRAVENOUS
  Filled 2020-09-18: qty 80

## 2020-09-18 MED ORDER — DEXTROSE 5 % IV SOLN: INTRAVENOUS | Status: AC

## 2020-09-18 NOTE — ED Notes (Signed)
Diaper change and complete linen change. Incontinent care completed.

## 2020-09-18 NOTE — Progress Notes (Signed)
Pharmacy Antibiotic Note  David [name] is a 87 y.o. male admitted on 09/18/2020 with sepsis.  Pharmacy has been consulted for Cefepime , Vancomycin dosing.  Plan: Cefepime 2 gm IV Q24H ordered starting on 1/24 @ 1900.  Vancomycin 1 gm IV X 1 given in ED on 1/24 @ 2047. Vancomycin 750 mg IV X 1 ordered to make total loading dose of 1750mg . Vancomycin 1250 mg IV Q48H ordered to start on 1/26 @ 2100.  AUC = 484.3 Vanc trough = 10.6   Height: 5\' 9"  (175.3 cm) Weight: 69.9 kg (154 lb) IBW/kg (Calculated) : 70.7  Temp (24hrs), Avg:98.5 F (36.9 C), Min:98.5 F (36.9 C), Max:98.5 F (36.9 C)  Recent Labs  Lab 09/18/20 1655 09/18/20 1656 09/18/20 1911  WBC 26.5*  --   --   CREATININE 1.88*  --   --   LATICACIDVEN  --  2.9* 1.6    Estimated Creatinine Clearance: 25.3 mL/min (A) (by C-G formula based on SCr of 1.88 mg/dL (H)).    No Known Allergies  Antimicrobials this admission:   >>    >>   Dose adjustments this admission:   Microbiology results:  BCx:   UCx:    Sputum:    MRSA PCR:   Thank you for allowing pharmacy to be a part of this patient's care.  Allen Egerton D 09/18/2020 9:27 PM

## 2020-09-18 NOTE — ED Notes (Signed)
Report off to kassie rn 

## 2020-09-18 NOTE — H&P (Signed)
History and Physical    David [name] ZOX:[id number] DOB: Oct 24, 1926 DOA: 09/18/2020  PCP: [facility]  Patient coming from: [facility]  I have personally briefly reviewed patient's old medical records in [facility]  Chief Complaint: Shortness of breath, diarrhea  HPI: David [name] is a 87 y.o. male with medical history significant for dementia, history of CVA, hypertension, recent admission for COVID-19 pneumonia who presents to the ED from SNF for evaluation of shortness of breath and diarrhea.  History is limited from patient due to underlying dementia and is otherwise supplemented by EDP, chart review, and patient's son by phone.  Patient recently admitted from 09/06/2020-09/12/2020 for acute hypoxic respiratory failure due to COVID-19 pneumonia and CAP.  Patient was treated with IV remdesivir, steroids, IV ceftriaxone/azithromycin.  He was discharged on 2 L of home O2 via [place] to SNF.  He was also noted to have orthostatic hypotension while in the hospital.  Patient returns to the ED from SNF due to reported shortness of breath and loose stools. Per ED triage notes patient was brought in via EMS on 100% NRB but switched to 3 L supplemental O2 via [place] on arrival. Patient complains of diarrhea and abdominal pain but is not able to provide further history.  ED Course:  Initial vitals showed BP 138/115, pulse 76, RR 22, temp 98.8F, SPO2 100% on 3 L supplemental O2 via [place].  Labs notable for sodium 148, potassium 4.1, chloride 115, bicarb 20, BUN 76, creatinine 1.88 (1.08 on 09/11/2020), serum glucose 163, hemoglobin 6.0 (12.0 on 09/10/2020), WBC 26.5, platelets 420,000, high-sensitivity troponin I 43, lactic acid 2.9 > 1.6.  Urinalysis negative nitrites, negative leukocytes, 6-10 RBCs and WBCs/hpf, rare bacteria microscopy.  Blood and urine cultures obtained and pending.  Procalcitonin <0.10.  C. difficile and GI pathogen panels are negative.  Portable chest x-ray shows  interval improvement in bilateral pulmonary opacities.  CTA chest/abdomen/pelvis obtained.  This is negative for evidence of PE.  Bilateral groundglass opacities seen consistent with recent COVID-19 pneumonia.  Mild bilateral hydronephroureter and a 7 mm nonobstructing left renal interpolar calculus seen.  Thickened appearance of the colon seen without evidence of bowel obstruction.  Large hiatal hernia also noted.  Per EDP, melena present on rectal exam which is Hemoccult positive.  Patient was started on broad-spectrum empiric antibiotics with IV vancomycin, cefepime and Flagyl.  Patient was given IV Protonix bolus and started on continuous infusion.  Patient received 1 L normal saline.  Order to transfuse 2 units PRBCs placed. EDP discussed with on-call GI who is aware of patient. The hospitalist service was consulted to admit for further evaluation and management.  Review of Systems:  Unable to obtain full review of systems due to patient's dementia.   Past Medical History:  Diagnosis Date  . Anxiety   . Arthritis    Rheumatoid  . Dementia (HCC)    Dx in May  . Hypertension   . Stroke [facility])     Past Surgical History:  Procedure Laterality Date  . 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 SPINE SURGERY  2014   "ruptured disc"     Social History:  reports that he has quit smoking. His smoking use included cigarettes. He has never used smokeless tobacco. He reports that he does not drink alcohol. No history on file for drug use.  No Known Allergies  Family History  Problem Relation Age of Onset  . Hypertension Mother  Prior to Admission medications   Medication Sig Start Date End Date Taking? Authorizing Provider  acetaminophen (TYLENOL) 325 MG tablet Take 2 tablets (650 mg total) by mouth every 6 (six) hours as needed for mild pain or headache (fever >/= 101). 09/12/20   Esaw Grandchild A, DO  amLODipine (NORVASC) 5 MG tablet Take 5 mg by mouth daily.     [provider]  aspirin EC 81 MG tablet Take 1 tablet (81 mg total) by mouth daily. 09/12/20   Pennie Banter, DO  bisacodyl (DULCOLAX) 10 MG suppository Place 1 suppository (10 mg total) rectally daily as needed for severe constipation. 09/12/20   Pennie Banter, DO  bisacodyl (DULCOLAX) 5 MG EC tablet Take 1 tablet (5 mg total) by mouth daily as needed for moderate constipation. 09/12/20   Pennie Banter, DO  dextromethorphan-guaiFENesin (MUCINEX DM) 30-600 MG 12hr tablet Take 1 tablet by mouth 2 (two) times daily for 7 days. 09/12/20 09/19/20  Esaw Grandchild A, DO  donepezil (ARICEPT) 5 MG tablet Take 1 tablet (5 mg total) by mouth at bedtime. 09/12/20   Pennie Banter, DO  Ipratropium-Albuterol (COMBIVENT) 20-100 MCG/ACT AERS respimat Inhale 1 puff into the lungs every 6 (six) hours. 09/12/20   Pennie Banter, DO  latanoprost (XALATAN) 0.005 % ophthalmic solution Place 1 drop into both eyes at bedtime.    [provider]  lisinopril (ZESTRIL) 5 MG tablet Take 1 tablet (5 mg total) by mouth daily. 09/12/20   Esaw Grandchild A, DO  melatonin 3 MG TABS tablet Take 1 tablet (3 mg total) by mouth at bedtime. 09/12/20   Pennie Banter, DO  polyethylene glycol (MIRALAX / GLYCOLAX) 17 g packet Take 17 g by mouth daily. Hold if frequent BM's or loose stools 09/13/20   Esaw Grandchild A, DO  senna-docusate (SENOKOT-S) 8.6-50 MG tablet Take 1 tablet by mouth at bedtime. Hold if frequent BM's or loose stools 09/12/20   Esaw Grandchild A, DO  tamsulosin (FLOMAX) 0.4 MG CAPS capsule Take 1 capsule (0.4 mg total) by mouth daily after supper. 09/12/20   Pennie Banter, DO  timolol (TIMOPTIC) 0.5 % ophthalmic solution Place 1 drop into both eyes daily.    [provider]    Physical Exam: Vitals:   09/18/20 1800 09/18/20 1900 09/18/20 1915 09/18/20 1930  BP: (!) 114/50 (!) 114/35 (!) 114/36 (!) 126/45  Pulse: 72 62 63 60  Resp: 18 12    Temp:      TempSrc:      SpO2:  100% 100% 100% 100%  Weight:      Height:       Constitutional: Elderly man resting in bed in the right lateral decubitus position.  Appears tired and somnolent but in NAD, calm, comfortable Eyes: PERRL, lids and conjunctivae normal ENMT: Mucous membranes are dry. Posterior pharynx clear of any exudate or lesions.Normal dentition.  Neck: normal, supple, no masses. Respiratory: clear to auscultation bilaterally, no wheezing, no crackles. Normal respiratory effort. No accessory muscle use.  Cardiovascular: Regular rate and rhythm, systolic murmur present. No extremity edema. 2+ pedal pulses. Abdomen: Mild generalized tenderness, no masses palpated. No hepatosplenomegaly. Bowel sounds positive.  Musculoskeletal: no clubbing / cyanosis. No joint deformity upper and lower extremities. Good ROM, no contractures. Normal muscle tone.  Skin: no rashes, lesions, ulcers. No induration Neurologic: CN 2-12 grossly intact. Sensation intact. Strength 5/5 in all 4.  Psychiatric: Somnolent but easily awakens, oriented to self but not place  or situation.  Labs on Admission: I have personally reviewed following labs and imaging studies  CBC: Recent Labs  Lab 09/18/20 1655  WBC 26.5*  NEUTROABS 24.9*  HGB 6.0*  HCT 19.9*  MCV 90.0  PLT 420*   Basic Metabolic Panel: Recent Labs  Lab 09/18/20 1655  NA 148*  K 4.1  CL 115*  CO2 20*  GLUCOSE 163*  BUN 76*  CREATININE 1.88*  CALCIUM 8.7*   GFR: Estimated Creatinine Clearance: 25.3 mL/min (A) (by C-G formula based on SCr of 1.88 mg/dL (H)). Liver Function Tests: Recent Labs  Lab 09/18/20 1655  AST 38  ALT 17  ALKPHOS 39  BILITOT 0.6  PROT 5.9*  ALBUMIN 2.2*   No results for input(s): LIPASE, AMYLASE in the last 168 hours. No results for input(s): AMMONIA in the last 168 hours. Coagulation Profile: No results for input(s): INR, PROTIME in the last 168 hours. Cardiac Enzymes: No results for input(s): CKTOTAL, CKMB, CKMBINDEX,  TROPONINI in the last 168 hours. BNP (last 3 results) No results for input(s): PROBNP in the last 8760 hours. HbA1C: No results for input(s): HGBA1C in the last 72 hours. CBG: No results for input(s): GLUCAP in the last 168 hours. Lipid Profile: No results for input(s): CHOL, HDL, LDLCALC, TRIG, CHOLHDL, LDLDIRECT in the last 72 hours. Thyroid Function Tests: No results for input(s): TSH, T4TOTAL, FREET4, T3FREE, THYROIDAB in the last 72 hours. Anemia Panel: No results for input(s): VITAMINB12, FOLATE, FERRITIN, TIBC, IRON, RETICCTPCT in the last 72 hours. Urine analysis:    Component Value Date/Time   COLORURINE YELLOW (A) 09/18/2020 1656   APPEARANCEUR CLEAR (A) 09/18/2020 1656   APPEARANCEUR Clear 09/26/2013 1256   LABSPEC 1.015 09/18/2020 1656   LABSPEC 1.015 09/26/2013 1256   PHURINE 5.0 09/18/2020 1656   GLUCOSEU NEGATIVE 09/18/2020 1656   GLUCOSEU Negative 09/26/2013 1256   HGBUR SMALL (A) 09/18/2020 1656   BILIRUBINUR NEGATIVE 09/18/2020 1656   BILIRUBINUR Negative 09/26/2013 1256   KETONESUR NEGATIVE 09/18/2020 1656   PROTEINUR NEGATIVE 09/18/2020 1656   NITRITE NEGATIVE 09/18/2020 1656   LEUKOCYTESUR NEGATIVE 09/18/2020 1656   LEUKOCYTESUR Negative 09/26/2013 1256    Radiological Exams on Admission: CT Angio Chest PE W and/or Wo Contrast  Result Date: 09/18/2020 CLINICAL DATA:  87 year old male with recent diagnosis of COVID-19. Concern for pulmonary embolism. Diarrhea. EXAM: CT ANGIOGRAPHY CHEST CT ABDOMEN AND PELVIS WITH CONTRAST TECHNIQUE: Multidetector CT imaging of the chest was performed using the standard protocol during bolus administration of intravenous contrast. Multiplanar CT image reconstructions and MIPs were obtained to evaluate the vascular anatomy. Multidetector CT imaging of the abdomen and pelvis was performed using the standard protocol during bolus administration of intravenous contrast. CONTRAST:  60mL OMNIPAQUE IOHEXOL 350 MG/ML SOLN COMPARISON:   Chest CT dated 02/24/2008 and CT abdomen pelvis dated 09/27/2013. FINDINGS: CTA CHEST FINDINGS Cardiovascular: There is no cardiomegaly or pericardial effusion. Three-vessel coronary vascular calcification. Advanced atherosclerotic calcification of the thoracic aorta. No aneurysmal dilatation or dissection. Evaluation of the pulmonary arteries is limited due to respiratory motion artifact. No pulmonary artery embolus identified. Mediastinum/Nodes: There is no hilar or mediastinal adenopathy. There is a large hiatal hernia. The esophagus is grossly unremarkable. No mediastinal fluid collection. Lungs/Pleura: Background of emphysema. Bilateral confluent ground-glass opacities, right greater left most consistent with multifocal pneumonia and in keeping with provided history of COVID-19. Clinical correlation is recommended. There is no pleural effusion pneumothorax. The central airways are patent. Musculoskeletal: Osteopenia with degenerative changes of the spine.  Old healed right rib fractures. No acute osseous pathology. Review of the MIP images confirms the above findings. CT ABDOMEN and PELVIS FINDINGS No intra-abdominal free air or free fluid. Hepatobiliary: Multiple hepatic hypodense lesions measure up to 2.3 cm in the left lobe of the liver. The larger lesions demonstrate fluid attenuation consistent with cysts and smaller lesions are too small to characterize. No intrahepatic biliary dilatation. The gallbladder is unremarkable. Pancreas: Unremarkable. No pancreatic ductal dilatation or surrounding inflammatory changes. Spleen: Normal in size without focal abnormality. Adrenals/Urinary Tract: The adrenal glands unremarkable. There is mild bilateral hydronephroureter. There is a 2.5 cm right renal cyst. There is a 7 mm nonobstructing left renal interpolar calculus. There is mild right perinephric stranding with apparent enhancement of the urothelium of the right renal collecting system and ureter right ureter.  Correlation with urinalysis recommended to evaluate for UTI. The urinary bladder is distended. Linear calcific density along the right posterior bladder wall may represent layering stone or focal calcification of the bladder wall. Stomach/Bowel: There is a large hiatal hernia. There is no bowel obstruction. There is loose stool throughout the colon consistent with provided history of diarrheal state. Thickened appearance of the colon may be related to underdistention or represent mild colitis. Clinical correlation is recommended. The appendix is normal. Vascular/Lymphatic: Advanced aortoiliac atherosclerotic disease. The IVC is unremarkable. No portal venous gas. There is no adenopathy. Reproductive: The prostate and seminal vesicles are grossly unremarkable. Other: None Musculoskeletal: Osteopenia with degenerative changes of the spine and scoliosis. Lower lumbar posterior fusion. No acute osseous pathology. Review of the MIP images confirms the above findings. IMPRESSION: 1. No CT evidence of pulmonary embolism. 2. Multifocal pneumonia in keeping with provided history of COVID-19. Clinical correlation is recommended. 3. Mild bilateral hydronephroureter. Correlation with urinalysis recommended to evaluate for UTI. 4. A 7 mm nonobstructing left renal interpolar calculus. 5. Diarrheal state. Thickened appearance of the colon may be related to underdistention or represent mild colitis. Clinical correlation is recommended. No bowel obstruction. Normal appendix. 6. Large hiatal hernia. 7. Aortic Atherosclerosis (ICD10-I70.0) and Emphysema (ICD10-J43.9). Electronically Signed   By: Elgie Collard M.D.   On: 09/18/2020 19:12   CT ABDOMEN PELVIS W CONTRAST  Result Date: 09/18/2020 CLINICAL DATA:  87 year old male with recent diagnosis of COVID-19. Concern for pulmonary embolism. Diarrhea. EXAM: CT ANGIOGRAPHY CHEST CT ABDOMEN AND PELVIS WITH CONTRAST TECHNIQUE: Multidetector CT imaging of the chest was performed  using the standard protocol during bolus administration of intravenous contrast. Multiplanar CT image reconstructions and MIPs were obtained to evaluate the vascular anatomy. Multidetector CT imaging of the abdomen and pelvis was performed using the standard protocol during bolus administration of intravenous contrast. CONTRAST:  13mL OMNIPAQUE IOHEXOL 350 MG/ML SOLN COMPARISON:  Chest CT dated 02/24/2008 and CT abdomen pelvis dated 09/27/2013. FINDINGS: CTA CHEST FINDINGS Cardiovascular: There is no cardiomegaly or pericardial effusion. Three-vessel coronary vascular calcification. Advanced atherosclerotic calcification of the thoracic aorta. No aneurysmal dilatation or dissection. Evaluation of the pulmonary arteries is limited due to respiratory motion artifact. No pulmonary artery embolus identified. Mediastinum/Nodes: There is no hilar or mediastinal adenopathy. There is a large hiatal hernia. The esophagus is grossly unremarkable. No mediastinal fluid collection. Lungs/Pleura: Background of emphysema. Bilateral confluent ground-glass opacities, right greater left most consistent with multifocal pneumonia and in keeping with provided history of COVID-19. Clinical correlation is recommended. There is no pleural effusion pneumothorax. The central airways are patent. Musculoskeletal: Osteopenia with degenerative changes of the spine. Old healed right rib fractures.  No acute osseous pathology. Review of the MIP images confirms the above findings. CT ABDOMEN and PELVIS FINDINGS No intra-abdominal free air or free fluid. Hepatobiliary: Multiple hepatic hypodense lesions measure up to 2.3 cm in the left lobe of the liver. The larger lesions demonstrate fluid attenuation consistent with cysts and smaller lesions are too small to characterize. No intrahepatic biliary dilatation. The gallbladder is unremarkable. Pancreas: Unremarkable. No pancreatic ductal dilatation or surrounding inflammatory changes. Spleen: Normal in  size without focal abnormality. Adrenals/Urinary Tract: The adrenal glands unremarkable. There is mild bilateral hydronephroureter. There is a 2.5 cm right renal cyst. There is a 7 mm nonobstructing left renal interpolar calculus. There is mild right perinephric stranding with apparent enhancement of the urothelium of the right renal collecting system and ureter right ureter. Correlation with urinalysis recommended to evaluate for UTI. The urinary bladder is distended. Linear calcific density along the right posterior bladder wall may represent layering stone or focal calcification of the bladder wall. Stomach/Bowel: There is a large hiatal hernia. There is no bowel obstruction. There is loose stool throughout the colon consistent with provided history of diarrheal state. Thickened appearance of the colon may be related to underdistention or represent mild colitis. Clinical correlation is recommended. The appendix is normal. Vascular/Lymphatic: Advanced aortoiliac atherosclerotic disease. The IVC is unremarkable. No portal venous gas. There is no adenopathy. Reproductive: The prostate and seminal vesicles are grossly unremarkable. Other: None Musculoskeletal: Osteopenia with degenerative changes of the spine and scoliosis. Lower lumbar posterior fusion. No acute osseous pathology. Review of the MIP images confirms the above findings. IMPRESSION: 1. No CT evidence of pulmonary embolism. 2. Multifocal pneumonia in keeping with provided history of COVID-19. Clinical correlation is recommended. 3. Mild bilateral hydronephroureter. Correlation with urinalysis recommended to evaluate for UTI. 4. A 7 mm nonobstructing left renal interpolar calculus. 5. Diarrheal state. Thickened appearance of the colon may be related to underdistention or represent mild colitis. Clinical correlation is recommended. No bowel obstruction. Normal appendix. 6. Large hiatal hernia. 7. Aortic Atherosclerosis (ICD10-I70.0) and Emphysema  (ICD10-J43.9). Electronically Signed   By: Elgie Collard M.D.   On: 09/18/2020 19:12   DG Chest Portable 1 View  Result Date: 09/18/2020 CLINICAL DATA:  87 year old male with shortness of breath. EXAM: PORTABLE CHEST 1 VIEW COMPARISON:  Chest radiograph dated 09/06/2020. FINDINGS: Interval improvement in bilateral pulmonary opacities with residual streaky densities which may represent residual infiltrate or post inflammatory changes. No consolidative changes. There is no pleural effusion pneumothorax. Stable cardiomegaly. Atherosclerotic calcification of the aorta. Moderate size hiatal hernia. No acute osseous pathology. IMPRESSION: Interval improvement in bilateral pulmonary opacities with residual streaky densities which may represent residual infiltrate or post inflammatory changes. Electronically Signed   By: Elgie Collard M.D.   On: 09/18/2020 17:38    EKG: Personally reviewed. Sinus rhythm with motion artifact.  Assessment/Plan Principal Problem:   Acute blood loss anemia Active Problems:   Pneumonia due to COVID-19 virus   Dementia (HCC)   Essential hypertension   History of CVA (cerebrovascular accident)   Hypernatremia   Systemic inflammatory response syndrome (SIRS) associated with organ dysfunction (HCC)   AKI (acute kidney injury) (HCC)  YICHEN GILARDI is a 87 y.o. male with medical history significant for dementia, history of CVA, hypertension, recent admission for COVID-19 pneumonia who is admitted with symptomatic acute blood loss anemia due to GI bleed.  Symptomatic acute blood loss anemia due to GI bleed: Hemoglobin dropped from 12.0 >>6.0 since 09/10/2020. Melenic Hemoccult positive  diarrhea present on arrival concerning for upper GI bleed. -Transfusing 2 units PRBCs -Continue IV Protonix infusion -Hold home aspirin and pharmacologic VTE prophylaxis -Start IV fluid hydration overnight -Discussed at length with patient's son who feels patient ultimately may not want  to undergo further invasive procedures such as upper endoscopy but is okay with management as above  SIRS: Patient with tachypnea, leukocytosis, lactic acidosis on arrival without obvious acute infectious source. Pulmonary infiltrates are seen related to recent Covid pneumonia. C. difficile and GI pathogen panels are negative. At this point we will continue empiric antibiotics although leukocytosis may be reactive. -Continue empiric IV vancomycin, cefepime, Flagyl -Follow blood and urine cultures and de-escalate antibiotics as able -Continue IV fluid resuscitation overnight  Acute kidney injury: Likely related to GI bleeding and volume loss. Continue IV fluid hydration overnight and repeat labs in a.m. Hold lisinopril and avoid NSAIDs.  Hypernatremia: Sodium 148 secondary to dehydration. Start IV D5W overnight.  Diarrhea: Suspect related to GI bleed as C. difficile and GI pathogen panels are negative. Viral gastroenteritis also possible. Continue supportive care.  Hypertension: BP currently stable. Hold home amlodipine and lisinopril as he has at risk for becoming hypotensive.  History of CVA: Hold aspirin with ongoing GI bleed.  Recent admission for COVID-19 pneumonia: SARS-CoV-2 PCR + 09/06/2020. Just admitted and discharged 09/12/2020 at which time he completed IV remdesivir, steroids, ceftriaxone/azithromycin. He was requiring 2 L O2 via [place] on discharge. Currently maintaining O2 saturations on 1.5-2 L [place].  Dementia: Chronic, seems to be declining since his wife passed away per son. Resume donepezil when able.  Goals of care: Discussed at length with patient's son by phone. Patient previously made it clear that his CODE STATUS is DNR and he would not want significant aggressive measures to prolong his life. At this point family is okay with current management with IV fluids, IV medications and antibiotics, blood transfusion but anticipate that they would not want to proceed with  invasive procedures such as endoscopy or surgical intervention.  DVT prophylaxis: SCDs Code Status: DNR, confirmed with patient's son Family Communication: Discussed with patient's son by phone Disposition Plan: From SNF and likely discharge to SNF pending clinical progress. Consults called: EDP discussed with on-call GI Level of care: Med-Surg Admission status:  Status is: Inpatient  Remains inpatient appropriate because:IV treatments appropriate due to intensity of illness or inability to take PO and Inpatient level of care appropriate due to severity of illness   Dispo: The patient is from: SNF              Anticipated d/c is to: SNF              Anticipated d/c date is: 3 days              Patient currently is not medically stable to d/c.   Darreld Mclean MD [facility]  If 7PM-7AM, please contact night-coverage [email or web address]  09/18/2020, 7:48 PM

## 2020-09-18 NOTE — ED Triage Notes (Signed)
Pt brought in via ems from [facility].  Pt is covid positive.  Pt brought in  For low oxygen sats.  Pt alert.  Iv in place  Dr Fuller Plan at bedside.

## 2020-09-18 NOTE — ED Notes (Signed)
Pt brought in via ems from [facility].  Pt awake.  Pt on 100% non rebreather and switched to [place] at 3 liters.  Pt with diarrhea on arrval to treatment room   md at bedside.  Pt sent to er for low oxygen sats.  nsr on monitor.  2 iv's in place.

## 2020-09-18 NOTE — ED Notes (Signed)
Pt return from ct scan 

## 2020-09-18 NOTE — ED Notes (Signed)
Pt now on 2 liters oxygen [place].  Pt awake.  nsr on monitor.

## 2020-09-18 NOTE — ED Provider Notes (Addendum)
[facility] Emergency Department Provider Note  ____________________________________________   Event Date/Time   First MD Initiated Contact with Patient 09/18/20 1653     (approximate)  I have reviewed the triage vital signs and the nursing notes.   HISTORY  Chief Complaint Covid Positive    HPI David [name] is a 87 y.o. male with dementia, hypertension, stroke who comes in for hypoxia.  Patient was noted to be hypoxic at his nursing home.  He is known to be Covid positive on 1/12.  He does report having shortness of breath, severe, constant, nothing makes it better, nothing makes it worse.  He does have a little bit of chest discomfort. There is notable diarrhea from patient as well.           Past Medical History:  Diagnosis Date  . Anxiety   . Arthritis    Rheumatoid  . Dementia (HCC)    Dx in May  . Hypertension   . Stroke [facility])     Patient Active Problem List   Diagnosis Date Noted  . Pneumonia due to COVID-19 virus 09/06/2020  . Acute hypoxemic respiratory failure (HCC) 09/06/2020  . Bilateral carotid artery stenosis 06/11/2017  . Dizziness 08/08/2016  . Syncopal episodes 08/08/2016    Past Surgical History:  Procedure Laterality Date  . 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 SPINE SURGERY  2014   "ruptured disc"     Prior to Admission medications   Medication Sig Start Date End Date Taking? Authorizing Provider  acetaminophen (TYLENOL) 325 MG tablet Take 2 tablets (650 mg total) by mouth every 6 (six) hours as needed for mild pain or headache (fever >/= 101). 09/12/20   Esaw Grandchild A, DO  amLODipine (NORVASC) 5 MG tablet Take 5 mg by mouth daily.    [provider]  aspirin EC 81 MG tablet Take 1 tablet (81 mg total) by mouth daily. 09/12/20   Pennie Banter, DO  bisacodyl (DULCOLAX) 10 MG suppository Place 1 suppository (10 mg total) rectally daily as needed for severe constipation. 09/12/20    Pennie Banter, DO  bisacodyl (DULCOLAX) 5 MG EC tablet Take 1 tablet (5 mg total) by mouth daily as needed for moderate constipation. 09/12/20   Pennie Banter, DO  dextromethorphan-guaiFENesin (MUCINEX DM) 30-600 MG 12hr tablet Take 1 tablet by mouth 2 (two) times daily for 7 days. 09/12/20 09/19/20  Esaw Grandchild A, DO  donepezil (ARICEPT) 5 MG tablet Take 1 tablet (5 mg total) by mouth at bedtime. 09/12/20   Pennie Banter, DO  Ipratropium-Albuterol (COMBIVENT) 20-100 MCG/ACT AERS respimat Inhale 1 puff into the lungs every 6 (six) hours. 09/12/20   Pennie Banter, DO  latanoprost (XALATAN) 0.005 % ophthalmic solution Place 1 drop into both eyes at bedtime.    [provider]  lisinopril (ZESTRIL) 5 MG tablet Take 1 tablet (5 mg total) by mouth daily. 09/12/20   Esaw Grandchild A, DO  melatonin 3 MG TABS tablet Take 1 tablet (3 mg total) by mouth at bedtime. 09/12/20   Pennie Banter, DO  polyethylene glycol (MIRALAX / GLYCOLAX) 17 g packet Take 17 g by mouth daily. Hold if frequent BM's or loose stools 09/13/20   Esaw Grandchild A, DO  senna-docusate (SENOKOT-S) 8.6-50 MG tablet Take 1 tablet by mouth at bedtime. Hold if frequent BM's or loose stools 09/12/20   Esaw Grandchild A, DO  tamsulosin (FLOMAX) 0.4 MG CAPS  capsule Take 1 capsule (0.4 mg total) by mouth daily after supper. 09/12/20   Pennie Banter, DO  timolol (TIMOPTIC) 0.5 % ophthalmic solution Place 1 drop into both eyes daily.    [provider]    Allergies Patient has no known allergies.  Family History  Problem Relation Age of Onset  . Hypertension Mother     Social History Social History   Tobacco Use  . Smoking status: Former Smoker    Types: Cigarettes  . Smokeless tobacco: Never Used  Substance Use Topics  . Alcohol use: No      Review of Systems Constitutional: No fever/chills Eyes: No visual changes. ENT: No sore throat. Cardiovascular: No chest pain Respiratory: Positive  for SOB Gastrointestinal: No abdominal pain.  No nausea, no vomiting.  No diarrhea.  No constipation. Genitourinary: Negative for dysuria. Diarrhea  Musculoskeletal: Negative for back pain. Skin: Negative for rash. Neurological: Negative for headaches, focal weakness or numbness. All other ROS negative ____________________________________________   PHYSICAL EXAM:  VITAL SIGNS: ED Triage Vitals [09/18/20 1647]  Enc Vitals Group     BP      Pulse      Resp      Temp      Temp src      SpO2      Weight 154 lb (69.9 kg)     Height 5\' 9"  (1.753 m)     Head Circumference      Peak Flow      Pain Score 0     Pain Loc      Pain Edu?      Excl. in GC?     Constitutional: Alert and oriented  x1. Mild distress.  Able to follow simple commands Eyes: Conjunctivae are normal. EOMI. Head: Atraumatic. Nose: No congestion/rhinnorhea. Mouth/Throat: Mucous membranes are moist.   Neck: No stridor. Trachea Midline. FROM Cardiovascular: Normal rate, regular rhythm. Grossly normal heart sounds.  Good peripheral circulation. Respiratory: no stridor no increased wob  Gastrointestinal: Reports tenderness.  No distention. No abdominal bruits.  Multiple episodes of diarrhea Musculoskeletal: No lower extremity tenderness nor edema.  No joint effusions. Neurologic: Soft-spoken but able to follow simple commands.  Squeezes bilateral hands and wiggles bilateral toes no gross focal neurologic deficits are appreciated.  Skin:  Skin is warm, dry and intact. No rash noted. Psychiatric: Mood and affect are normal. Speech and behavior are normal. GU: Deferred   ____________________________________________   LABS (all labs ordered are listed, but only abnormal results are displayed)  Labs Reviewed  CBC WITH DIFFERENTIAL/PLATELET - Abnormal; Notable for the following components:      Result Value   WBC 26.5 (*)    RBC 2.21 (*)    Hemoglobin 6.0 (*)    HCT 19.9 (*)    RDW 16.7 (*)    Platelets 420  (*)    Neutro Abs 24.9 (*)    All other components within normal limits  COMPREHENSIVE METABOLIC PANEL - Abnormal; Notable for the following components:   Sodium 148 (*)    Chloride 115 (*)    CO2 20 (*)    Glucose, Bld 163 (*)    BUN 76 (*)    Creatinine, Ser 1.88 (*)    Calcium 8.7 (*)    Total Protein 5.9 (*)    Albumin 2.2 (*)    GFR, Estimated 33 (*)    All other components within normal limits  URINALYSIS, COMPLETE (UACMP) WITH MICROSCOPIC - Abnormal; Notable for  the following components:   Color, Urine YELLOW (*)    APPearance CLEAR (*)    Hgb urine dipstick SMALL (*)    Bacteria, UA RARE (*)    All other components within normal limits  LACTIC ACID, PLASMA - Abnormal; Notable for the following components:   Lactic Acid, Venous 2.9 (*)    All other components within normal limits  TROPONIN I (HIGH SENSITIVITY) - Abnormal; Notable for the following components:   Troponin I (High Sensitivity) 43 (*)    All other components within normal limits  C DIFFICILE (CDIFF) QUICK SCRN (NO PCR REFLEX)  CULTURE, BLOOD (ROUTINE X 2)  CULTURE, BLOOD (ROUTINE X 2)  GASTROINTESTINAL PANEL BY PCR, STOOL (REPLACES STOOL CULTURE)  BRAIN NATRIURETIC PEPTIDE  PROCALCITONIN  LACTIC ACID, PLASMA  PROCALCITONIN  PROTIME-INR  APTT  TYPE AND SCREEN  PREPARE RBC (CROSSMATCH)  TROPONIN I (HIGH SENSITIVITY)   ____________________________________________   ED ECG REPORT I, Concha Se, the attending physician, personally viewed and interpreted this ECG.  Normal sinus rate 62, no ST elevation, no T wave inversions, normal intervals ____________________________________________  RADIOLOGY Vela Prose, personally viewed and evaluated these images (plain radiographs) as part of my medical decision making, as well as reviewing the written report by the radiologist.  ED MD interpretation: Bilateral opacifications  Official radiology report(s): DG Chest Portable 1 View  Result Date:  09/18/2020 CLINICAL DATA:  88 year old male with shortness of breath. EXAM: PORTABLE CHEST 1 VIEW COMPARISON:  Chest radiograph dated 09/06/2020. FINDINGS: Interval improvement in bilateral pulmonary opacities with residual streaky densities which may represent residual infiltrate or post inflammatory changes. No consolidative changes. There is no pleural effusion pneumothorax. Stable cardiomegaly. Atherosclerotic calcification of the aorta. Moderate size hiatal hernia. No acute osseous pathology. IMPRESSION: Interval improvement in bilateral pulmonary opacities with residual streaky densities which may represent residual infiltrate or post inflammatory changes. Electronically Signed   By: Elgie Collard M.D.   On: 09/18/2020 17:38    ____________________________________________   PROCEDURES  Procedure(s) performed (including Critical Care):  .Critical Care Performed by: Concha Se, MD Authorized by: Concha Se, MD   Critical care provider statement:    Critical care time (minutes):  45   Critical care was necessary to treat or prevent imminent or life-threatening deterioration of the following conditions:  Circulatory failure   Critical care was time spent personally by me on the following activities:  Discussions with consultants, evaluation of patient's response to treatment, examination of patient, ordering and performing treatments and interventions, ordering and review of laboratory studies, ordering and review of radiographic studies, pulse oximetry, re-evaluation of patient's condition, obtaining history from patient or surrogate and review of old charts  .1-3 Lead EKG Interpretation Performed by: Concha Se, MD Authorized by: Concha Se, MD     Interpretation: normal     ECG rate:  70s    ECG rate assessment: normal     Rhythm: sinus rhythm     Ectopy: none     Conduction: normal       ____________________________________________   INITIAL IMPRESSION /  ASSESSMENT AND PLAN / ED COURSE   MAHIR PRABHAKAR was evaluated in Emergency Department on 09/18/2020 for the symptoms described in the history of present illness. He was evaluated in the context of the global COVID-19 pandemic, which necessitated consideration that the patient might be at risk for infection with the SARS-CoV-2 virus that causes COVID-19. Institutional protocols and algorithms that pertain to  the evaluation of patients at risk for COVID-19 are in a state of rapid change based on information released by regulatory bodies including the CDC and federal and state organizations. These policies and algorithms were followed during the patient's care in the ED.     Pt presents with SOB.  Will get repeat chest x-ray given the concern this could be secondary to his COVID.  Patient require PE scan given he is now 12 days out to make sure is no concurrent PE.   PNA-will get xray to evaluation Anemia-CBC to evaluate ACS- will get trops Arrhythmia-Will get EKG and keep on monitor.  COVID- will get testing per algorithm.   Patient has notable AKI.  Patient is getting 1 L of fluid.  Patient's hemoglobin is down from 12-6.  His stool looks melanotic.  It was Hemoccult positive.  Will start on PPI.  Discussed with POA son David [name] who would like full work-up but patient is DNR.  They are okay with getting blood.  Will give 2 unit of RBCs.  Will start on broad-spectrum antibiotics given elevated white count elevated otherwise does not meet SIRS criteria.  Given patient did have abdominal pain when I palpated his abdomen as well as the shortness of breath will get CT PE and CT abdomen.  Patient will require admission due to the above  CT scans were reassuring.  There is some mild bilateral hydronephrosis but his urine is not significant for UTI.  Rectal exam did have melena on it.  I concern about GI bleed.  Patient was started on PPI.  No history of liver issues.       ____________________________________________   FINAL CLINICAL IMPRESSION(S) / ED DIAGNOSES   Final diagnoses:  AKI (acute kidney injury) (HCC)  Gastrointestinal hemorrhage, unspecified gastrointestinal hemorrhage type  Symptomatic anemia  Pneumonia due to COVID-19 virus     MEDICATIONS GIVEN DURING THIS VISIT:  Medications  sodium chloride 0.9 % bolus 1,000 mL (has no administration in time range)  0.9 %  sodium chloride infusion (has no administration in time range)  pantoprazole (PROTONIX) 80 mg in sodium chloride 0.9 % 100 mL IVPB (has no administration in time range)  pantoprazole (PROTONIX) 80 mg in sodium chloride 0.9 % 100 mL (0.8 mg/mL) infusion (has no administration in time range)  pantoprazole (PROTONIX) injection 40 mg (has no administration in time range)  ceFEPIme (MAXIPIME) 2 g in sodium chloride 0.9 % 100 mL IVPB (has no administration in time range)  metroNIDAZOLE (FLAGYL) IVPB 500 mg (has no administration in time range)  vancomycin (VANCOCIN) IVPB 1000 mg/200 mL premix (has no administration in time range)  iohexol (OMNIPAQUE) 350 MG/ML injection 60 mL (60 mLs Intravenous Contrast Given 09/18/20 1829)     ED Discharge Orders    None       Note:  This document was prepared using Dragon voice recognition software and may include unintentional dictation errors.   Concha Se, MD 09/18/20 Margretta Ditty    Concha Se, MD 09/18/20 [phone]

## 2020-09-18 NOTE — Consult Note (Signed)
PHARMACY -  BRIEF ANTIBIOTIC NOTE   Pharmacy has received consult(s) for Cefepime and Vancomycin from an ED provider.  The patient's profile has been reviewed for ht/wt/allergies/indication/available labs.    One time order(s) placed for Cefepime 2g and Vancomycin 1g  Further antibiotics/pharmacy consults should be ordered by admitting physician if indicated.                       Raiford Noble, PharmD Pharmacy Resident  09/18/2020 6:57 PM

## 2020-09-18 NOTE — Consult Note (Signed)
CODE SEPSIS - PHARMACY COMMUNICATION  **Broad Spectrum Antibiotics should be administered within 1 hour of Sepsis diagnosis**  Time Code Sepsis Called/Page Received: 1854  Antibiotics Ordered:  Cefepime 2g IV x1  Vancomycin 1g IV x1 Metronidazole 500mg  IV x1  Time of 1st antibiotic administration: 1924   , PharmD Pharmacy Resident  09/18/2020 7:01 PM

## 2020-09-19 ENCOUNTER — Encounter: Payer: Self-pay | Admitting: Internal Medicine

## 2020-09-19 DIAGNOSIS — D62 Acute posthemorrhagic anemia: Secondary | ICD-10-CM

## 2020-09-19 DIAGNOSIS — J1282 Pneumonia due to coronavirus disease 2019: Secondary | ICD-10-CM

## 2020-09-19 DIAGNOSIS — U071 COVID-19: Secondary | ICD-10-CM | POA: Diagnosis not present

## 2020-09-19 DIAGNOSIS — K921 Melena: Secondary | ICD-10-CM | POA: Diagnosis not present

## 2020-09-19 LAB — CBC
HCT: 23 % — ABNORMAL LOW (ref 39.0–52.0)
Hemoglobin: 7.3 g/dL — ABNORMAL LOW (ref 13.0–17.0)
MCH: 27.9 pg (ref 26.0–34.0)
MCHC: 31.7 g/dL (ref 30.0–36.0)
MCV: 87.8 fL (ref 80.0–100.0)
Platelets: 300 10*3/uL (ref 150–400)
RBC: 2.62 MIL/uL — ABNORMAL LOW (ref 4.22–5.81)
RDW: 16.2 % — ABNORMAL HIGH (ref 11.5–15.5)
WBC: 25 10*3/uL — ABNORMAL HIGH (ref 4.0–10.5)
nRBC: 0 % (ref 0.0–0.2)

## 2020-09-19 LAB — MAGNESIUM: Magnesium: 2.2 mg/dL (ref 1.7–2.4)

## 2020-09-19 LAB — BASIC METABOLIC PANEL
Anion gap: 10 (ref 5–15)
BUN: 67 mg/dL — ABNORMAL HIGH (ref 8–23)
CO2: 18 mmol/L — ABNORMAL LOW (ref 22–32)
Calcium: 7.2 mg/dL — ABNORMAL LOW (ref 8.9–10.3)
Chloride: 118 mmol/L — ABNORMAL HIGH (ref 98–111)
Creatinine, Ser: 1.61 mg/dL — ABNORMAL HIGH (ref 0.61–1.24)
GFR, Estimated: 40 mL/min — ABNORMAL LOW (ref >60)
Glucose, Bld: 155 mg/dL — ABNORMAL HIGH (ref 70–99)
Potassium: 3.1 mmol/L — ABNORMAL LOW (ref 3.5–5.1)
Sodium: 146 mmol/L — ABNORMAL HIGH (ref 135–145)

## 2020-09-19 LAB — PROTIME-INR
INR: 1.1 (ref 0.8–1.2)
Prothrombin Time: 13.4 seconds (ref 11.4–15.2)

## 2020-09-19 LAB — PROCALCITONIN: Procalcitonin: 0.17 ng/mL

## 2020-09-19 MED ORDER — BACID PO TABS
2.0000 | ORAL_TABLET | Freq: Three times a day (TID) | ORAL | Status: DC
  Filled 2020-09-19: qty 2

## 2020-09-19 MED ORDER — POTASSIUM CHLORIDE 10 MEQ/100ML IV SOLN
10.0000 meq | INTRAVENOUS | Status: AC
Start: 2020-09-19 — End: 2020-09-20
  Administered 2020-09-19 – 2020-09-20 (×4): 10 meq via INTRAVENOUS
  Filled 2020-09-19 (×3): qty 100

## 2020-09-19 MED ORDER — POTASSIUM CHLORIDE 10 MEQ/100ML IV SOLN
10.0000 meq | INTRAVENOUS | Status: AC
  Administered 2020-09-19 (×4): 10 meq via INTRAVENOUS
  Filled 2020-09-19 (×4): qty 100

## 2020-09-19 MED ORDER — RISAQUAD PO CAPS
2.0000 | ORAL_CAPSULE | Freq: Three times a day (TID) | ORAL | Status: DC
  Administered 2020-09-19 – 2020-09-29 (×26): 2 via ORAL
  Filled 2020-09-19 (×29): qty 2

## 2020-09-19 NOTE — Consult Note (Signed)
David Bouillon, MD [address], [place], [place], [address] [address], [address], [place], [place], [address] Phone: [phone]  Fax: [phone]  Consultation  Referring Provider:     Dr. Marylu Lund Primary Care Physician:  [facility] Reason for Consultation:     Melena  Date of Admission:  09/18/2020 Date of Consultation:  09/19/2020         HPI:   David [name] is a 87 y.o. male with underlying dementia, and history obtained from patient and chart, presents from skilled nursing facility due to shortness of breath and found to have anemia and melena in the ER.  Recently admitted and discharged on 09/12/2020 after treatment for COVID-19 pneumonia.  Patient reported abdominal pain and diarrhea but unable to provide further history.  No reported emesis by patient, EMS staff and ER notes.  As per H&P, patient's family/son may not want invasive procedures.  CT in the ER shows multifocal pneumonia with recent history of COVID-19, mild bilateral hydronephroureter, diarrheal state with thickened appearance of the colon "may be related to underdistention or represent mild colitis."  Large hiatal hernia is also reported  Previous records personally reviewed.  Patient was evaluated by Dr. [facility] in 2016, for fecal incontinence.  Their note states that patient last colonoscopy was questionably 6 years ago, patient and family unsure, with no previous records found.  I am not able to find any previous records of his previous colonoscopies either.  No prior upper endoscopy.  Past Medical History:  Diagnosis Date  . Anxiety   . Arthritis    Rheumatoid  . Dementia (HCC)    Dx in May  . Hypertension   . Stroke [facility])     Past Surgical History:  Procedure Laterality Date  . 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 SPINE SURGERY  2014   "ruptured disc"     Prior to Admission medications   Medication Sig Start Date End Date Taking?  Authorizing Provider  acetaminophen (TYLENOL) 325 MG tablet Take 2 tablets (650 mg total) by mouth every 6 (six) hours as needed for mild pain or headache (fever >/= 101). 09/12/20  Yes Esaw Grandchild A, DO  amLODipine (NORVASC) 5 MG tablet Take 5 mg by mouth daily.   Yes [provider]  aspirin EC 81 MG tablet Take 1 tablet (81 mg total) by mouth daily. 09/12/20  Yes Esaw Grandchild A, DO  bisacodyl (DULCOLAX) 10 MG suppository Place 1 suppository (10 mg total) rectally daily as needed for severe constipation. 09/12/20  Yes Esaw Grandchild A, DO  bisacodyl (DULCOLAX) 5 MG EC tablet Take 1 tablet (5 mg total) by mouth daily as needed for moderate constipation. 09/12/20  Yes Pennie Banter, DO  dextromethorphan-guaiFENesin (MUCINEX DM) 30-600 MG 12hr tablet Take 1 tablet by mouth 2 (two) times daily for 7 days. 09/12/20 09/19/20 Yes Esaw Grandchild A, DO  donepezil (ARICEPT) 5 MG tablet Take 1 tablet (5 mg total) by mouth at bedtime. 09/12/20  Yes Esaw Grandchild A, DO  Ipratropium-Albuterol (COMBIVENT) 20-100 MCG/ACT AERS respimat Inhale 1 puff into the lungs every 6 (six) hours. 09/12/20  Yes Esaw Grandchild A, DO  latanoprost (XALATAN) 0.005 % ophthalmic solution Place 1 drop into both eyes at bedtime.   Yes [provider]  lisinopril (ZESTRIL) 5 MG tablet Take 1 tablet (5 mg total) by mouth daily. 09/12/20  Yes Esaw Grandchild A, DO  melatonin 3  MG TABS tablet Take 1 tablet (3 mg total) by mouth at bedtime. 09/12/20  Yes Esaw Grandchild A, DO  polyethylene glycol (MIRALAX / GLYCOLAX) 17 g packet Take 17 g by mouth daily. Hold if frequent BM's or loose stools 09/13/20  Yes Esaw Grandchild A, DO  senna-docusate (SENOKOT-S) 8.6-50 MG tablet Take 1 tablet by mouth at bedtime. Hold if frequent BM's or loose stools 09/12/20  Yes Esaw Grandchild A, DO  tamsulosin (FLOMAX) 0.4 MG CAPS capsule Take 1 capsule (0.4 mg total) by mouth daily after supper. 09/12/20  Yes Esaw Grandchild A, DO  timolol  (TIMOPTIC) 0.5 % ophthalmic solution Place 1 drop into both eyes daily.   Yes [provider]    Family History  Problem Relation Age of Onset  . Hypertension Mother      Social History   Tobacco Use  . Smoking status: Former Smoker    Types: Cigarettes  . Smokeless tobacco: Never Used  Substance Use Topics  . Alcohol use: No    Allergies as of 09/18/2020  . (No Known Allergies)    Review of Systems:    All systems reviewed and negative except where noted in HPI.   Physical Exam:  Vital signs in last 24 hours: Vitals:   09/19/20 0700 09/19/20 0730 09/19/20 0800 09/19/20 0830  BP: (!) 133/55 (!) 126/56 (!) 142/49 (!) 141/48  Pulse: 74 75 63 65  Resp:   14 (!) 21  Temp:      TempSrc:      SpO2: 99% 100% 100% 100%  Weight:      Height:         General:   Pleasant, cooperative in NAD Head:  Normocephalic and atraumatic. Eyes:   No icterus.   Conjunctiva pink. PERRLA. Ears:  Normal auditory acuity. Neck:  Supple; no masses or thyroidomegaly Lungs: Respirations even and unlabored. Lungs clear to auscultation bilaterally.   No wheezes, crackles, or rhonchi.  Abdomen:  Soft, nondistended, nontender. Normal bowel sounds. No appreciable masses or hepatomegaly.  No rebound or guarding.  Neurologic:  Alert and oriented x3;  grossly normal neurologically. Skin:  Intact without significant lesions or rashes. Cervical Nodes:  No significant cervical adenopathy. Psych:  Alert and cooperative. Normal affect.  LAB RESULTS: Recent Labs    09/18/20 1655 09/19/20 0441  WBC 26.5* 25.0*  HGB 6.0* 7.3*  HCT 19.9* 23.0*  PLT 420* 300   BMET Recent Labs    09/18/20 1655 09/19/20 0441  NA 148* 146*  K 4.1 3.1*  CL 115* 118*  CO2 20* 18*  GLUCOSE 163* 155*  BUN 76* 67*  CREATININE 1.88* 1.61*  CALCIUM 8.7* 7.2*   LFT Recent Labs    09/18/20 1655  PROT 5.9*  ALBUMIN 2.2*  AST 38  ALT 17  ALKPHOS 39  BILITOT 0.6   PT/INR Recent Labs    09/18/20 1911  09/19/20 0441  LABPROT 14.3 13.4  INR 1.2 1.1    STUDIES: CT Angio Chest PE W and/or Wo Contrast  Result Date: 09/18/2020 CLINICAL DATA:  87 year old male with recent diagnosis of COVID-19. Concern for pulmonary embolism. Diarrhea. EXAM: CT ANGIOGRAPHY CHEST CT ABDOMEN AND PELVIS WITH CONTRAST TECHNIQUE: Multidetector CT imaging of the chest was performed using the standard protocol during bolus administration of intravenous contrast. Multiplanar CT image reconstructions and MIPs were obtained to evaluate the vascular anatomy. Multidetector CT imaging of the abdomen and pelvis was performed using the standard protocol during bolus administration of intravenous  contrast. CONTRAST:  70mL OMNIPAQUE IOHEXOL 350 MG/ML SOLN COMPARISON:  Chest CT dated 02/24/2008 and CT abdomen pelvis dated 09/27/2013. FINDINGS: CTA CHEST FINDINGS Cardiovascular: There is no cardiomegaly or pericardial effusion. Three-vessel coronary vascular calcification. Advanced atherosclerotic calcification of the thoracic aorta. No aneurysmal dilatation or dissection. Evaluation of the pulmonary arteries is limited due to respiratory motion artifact. No pulmonary artery embolus identified. Mediastinum/Nodes: There is no hilar or mediastinal adenopathy. There is a large hiatal hernia. The esophagus is grossly unremarkable. No mediastinal fluid collection. Lungs/Pleura: Background of emphysema. Bilateral confluent ground-glass opacities, right greater left most consistent with multifocal pneumonia and in keeping with provided history of COVID-19. Clinical correlation is recommended. There is no pleural effusion pneumothorax. The central airways are patent. Musculoskeletal: Osteopenia with degenerative changes of the spine. Old healed right rib fractures. No acute osseous pathology. Review of the MIP images confirms the above findings. CT ABDOMEN and PELVIS FINDINGS No intra-abdominal free air or free fluid. Hepatobiliary: Multiple hepatic  hypodense lesions measure up to 2.3 cm in the left lobe of the liver. The larger lesions demonstrate fluid attenuation consistent with cysts and smaller lesions are too small to characterize. No intrahepatic biliary dilatation. The gallbladder is unremarkable. Pancreas: Unremarkable. No pancreatic ductal dilatation or surrounding inflammatory changes. Spleen: Normal in size without focal abnormality. Adrenals/Urinary Tract: The adrenal glands unremarkable. There is mild bilateral hydronephroureter. There is a 2.5 cm right renal cyst. There is a 7 mm nonobstructing left renal interpolar calculus. There is mild right perinephric stranding with apparent enhancement of the urothelium of the right renal collecting system and ureter right ureter. Correlation with urinalysis recommended to evaluate for UTI. The urinary bladder is distended. Linear calcific density along the right posterior bladder wall may represent layering stone or focal calcification of the bladder wall. Stomach/Bowel: There is a large hiatal hernia. There is no bowel obstruction. There is loose stool throughout the colon consistent with provided history of diarrheal state. Thickened appearance of the colon may be related to underdistention or represent mild colitis. Clinical correlation is recommended. The appendix is normal. Vascular/Lymphatic: Advanced aortoiliac atherosclerotic disease. The IVC is unremarkable. No portal venous gas. There is no adenopathy. Reproductive: The prostate and seminal vesicles are grossly unremarkable. Other: None Musculoskeletal: Osteopenia with degenerative changes of the spine and scoliosis. Lower lumbar posterior fusion. No acute osseous pathology. Review of the MIP images confirms the above findings. IMPRESSION: 1. No CT evidence of pulmonary embolism. 2. Multifocal pneumonia in keeping with provided history of COVID-19. Clinical correlation is recommended. 3. Mild bilateral hydronephroureter. Correlation with  urinalysis recommended to evaluate for UTI. 4. A 7 mm nonobstructing left renal interpolar calculus. 5. Diarrheal state. Thickened appearance of the colon may be related to underdistention or represent mild colitis. Clinical correlation is recommended. No bowel obstruction. Normal appendix. 6. Large hiatal hernia. 7. Aortic Atherosclerosis (ICD10-I70.0) and Emphysema (ICD10-J43.9). Electronically Signed   By: Elgie Collard M.D.   On: 09/18/2020 19:12   CT ABDOMEN PELVIS W CONTRAST  Result Date: 09/18/2020 CLINICAL DATA:  87 year old male with recent diagnosis of COVID-19. Concern for pulmonary embolism. Diarrhea. EXAM: CT ANGIOGRAPHY CHEST CT ABDOMEN AND PELVIS WITH CONTRAST TECHNIQUE: Multidetector CT imaging of the chest was performed using the standard protocol during bolus administration of intravenous contrast. Multiplanar CT image reconstructions and MIPs were obtained to evaluate the vascular anatomy. Multidetector CT imaging of the abdomen and pelvis was performed using the standard protocol during bolus administration of intravenous contrast. CONTRAST:  11mL OMNIPAQUE  IOHEXOL 350 MG/ML SOLN COMPARISON:  Chest CT dated 02/24/2008 and CT abdomen pelvis dated 09/27/2013. FINDINGS: CTA CHEST FINDINGS Cardiovascular: There is no cardiomegaly or pericardial effusion. Three-vessel coronary vascular calcification. Advanced atherosclerotic calcification of the thoracic aorta. No aneurysmal dilatation or dissection. Evaluation of the pulmonary arteries is limited due to respiratory motion artifact. No pulmonary artery embolus identified. Mediastinum/Nodes: There is no hilar or mediastinal adenopathy. There is a large hiatal hernia. The esophagus is grossly unremarkable. No mediastinal fluid collection. Lungs/Pleura: Background of emphysema. Bilateral confluent ground-glass opacities, right greater left most consistent with multifocal pneumonia and in keeping with provided history of COVID-19. Clinical  correlation is recommended. There is no pleural effusion pneumothorax. The central airways are patent. Musculoskeletal: Osteopenia with degenerative changes of the spine. Old healed right rib fractures. No acute osseous pathology. Review of the MIP images confirms the above findings. CT ABDOMEN and PELVIS FINDINGS No intra-abdominal free air or free fluid. Hepatobiliary: Multiple hepatic hypodense lesions measure up to 2.3 cm in the left lobe of the liver. The larger lesions demonstrate fluid attenuation consistent with cysts and smaller lesions are too small to characterize. No intrahepatic biliary dilatation. The gallbladder is unremarkable. Pancreas: Unremarkable. No pancreatic ductal dilatation or surrounding inflammatory changes. Spleen: Normal in size without focal abnormality. Adrenals/Urinary Tract: The adrenal glands unremarkable. There is mild bilateral hydronephroureter. There is a 2.5 cm right renal cyst. There is a 7 mm nonobstructing left renal interpolar calculus. There is mild right perinephric stranding with apparent enhancement of the urothelium of the right renal collecting system and ureter right ureter. Correlation with urinalysis recommended to evaluate for UTI. The urinary bladder is distended. Linear calcific density along the right posterior bladder wall may represent layering stone or focal calcification of the bladder wall. Stomach/Bowel: There is a large hiatal hernia. There is no bowel obstruction. There is loose stool throughout the colon consistent with provided history of diarrheal state. Thickened appearance of the colon may be related to underdistention or represent mild colitis. Clinical correlation is recommended. The appendix is normal. Vascular/Lymphatic: Advanced aortoiliac atherosclerotic disease. The IVC is unremarkable. No portal venous gas. There is no adenopathy. Reproductive: The prostate and seminal vesicles are grossly unremarkable. Other: None Musculoskeletal:  Osteopenia with degenerative changes of the spine and scoliosis. Lower lumbar posterior fusion. No acute osseous pathology. Review of the MIP images confirms the above findings. IMPRESSION: 1. No CT evidence of pulmonary embolism. 2. Multifocal pneumonia in keeping with provided history of COVID-19. Clinical correlation is recommended. 3. Mild bilateral hydronephroureter. Correlation with urinalysis recommended to evaluate for UTI. 4. A 7 mm nonobstructing left renal interpolar calculus. 5. Diarrheal state. Thickened appearance of the colon may be related to underdistention or represent mild colitis. Clinical correlation is recommended. No bowel obstruction. Normal appendix. 6. Large hiatal hernia. 7. Aortic Atherosclerosis (ICD10-I70.0) and Emphysema (ICD10-J43.9). Electronically Signed   By: Elgie Collard M.D.   On: 09/18/2020 19:12   DG Chest Portable 1 View  Result Date: 09/18/2020 CLINICAL DATA:  86 year old male with shortness of breath. EXAM: PORTABLE CHEST 1 VIEW COMPARISON:  Chest radiograph dated 09/06/2020. FINDINGS: Interval improvement in bilateral pulmonary opacities with residual streaky densities which may represent residual infiltrate or post inflammatory changes. No consolidative changes. There is no pleural effusion pneumothorax. Stable cardiomegaly. Atherosclerotic calcification of the aorta. Moderate size hiatal hernia. No acute osseous pathology. IMPRESSION: Interval improvement in bilateral pulmonary opacities with residual streaky densities which may represent residual infiltrate or post inflammatory changes. Electronically Signed  By: Elgie Collard M.D.   On: 09/18/2020 17:38      Impression / Plan:   David [name] is a 87 y.o. y/o male with dementia, admission for shortness of breath, diarrhea with negative C. difficile and GI panel, found to have anemia and melena with no known recent endoscopies  I spoke at length with patient's son, Minerva Areola over the phone.  He had several  questions which were answered and he would like to discuss things with his wife before considering endoscopic procedures.  Patient has an elevated white count, requiring supplemental oxygen, multifocal pneumonia changes on CT scan due to his recent COVID-19 infection, which would make the procedure higher risk in this elderly gentleman.  I discussed the case with anesthesia as well, and given above clinical picture, they do suggested that medical optimization allowing some time prior to endoscopy would help  His flowsheets showed that his last bowel movements, at around 730 this morning was brown.  Prior to that he did have a black bowel movement  Continue medical optimization at this time  Patient's family is apprehensive about procedures, but would like to discuss it amongst themselves before consenting  Patient remained hemodynamically stable at this time  PPI IV twice daily  Continue serial CBCs and transfuse PRN Avoid NSAIDs Maintain 2 large-bore IV lines Please page GI with any acute hemodynamic changes, or signs of active GI bleeding   No indication for emergent procedures at this time given hemodynamic stability, last known bowel movement was brown in color, and elevated white count and multifocal pneumonia on CT, and requirement of supplemental oxygen  However, if patient has signs of active GI bleeding, this may change and patient may need procedures right away.  Please page GI if this occurs  Timing of endoscopic procedures during this admission based on clinical status and consent from family which is pending at this time   Thank you for involving me in the care of this patient.      LOS: 1 day   Pasty Spillers, MD  09/19/2020, 9:26 AM

## 2020-09-19 NOTE — ED Notes (Signed)
Incontinence care complete, diaper changed, partial linen change.

## 2020-09-19 NOTE — Progress Notes (Signed)
PROGRESS NOTE    GERRIT SCHREINER  THY:[id number] DOB: 04/15/1927 DOA: 09/18/2020 PCP: [facility]    Brief Narrative:  DARLENE BONE is a 87 y.o. male with medical history significant for dementia, history of CVA, hypertension, recent admission for COVID-19 pneumonia who presents to the ED from SNF for evaluation of shortness of breath and diarrhea.  History is limited from patient due to underlying dementia and is otherwise supplemented by EDP, chart review, and patient's son by phone.  Patient recently admitted from 09/06/2020-09/12/2020 for acute hypoxic respiratory failure due to COVID-19 pneumonia and CAP.  Patient was treated with IV remdesivir, steroids, IV ceftriaxone/azithromycin.  He was discharged on 2 L of home O2 via [place] to SNF.  He was also noted to have orthostatic hypotension while in the hospital.  Patient returns to the ED from SNF due to reported shortness of breath and loose stools. Per ED triage notes patient was brought in via EMS on 100% NRB but switched to 3 L supplemental O2 via [place] on arrival. Patient complains of diarrhea and abdominal pain but is not able to provide further history.   Labs notable , bicarb 20, BUN 76, creatinine 1.88 (1.08 on 09/11/2020), serum glucose 163, hemoglobin 6.0 (12.0 on 09/10/2020), WBC 26.5, platelets 420,000, high-sensitivity troponin I 43, lactic acid 2.9 > 1.6.  Urinalysis negative nitrites, negative leukocytes, 6-10 RBCs and WBCs/hpf, rare bacteria microscopy.  Blood and urine cultures obtained and pending.  Procalcitonin <0.10.  C. difficile and GI pathogen panels are negative.  Portable chest x-ray shows interval improvement in bilateral pulmonary opacities.  CTA chest/abdomen/pelvis obtained.  This is negative for evidence of PE.  Bilateral groundglass opacities seen consistent with recent COVID-19 pneumonia.  Mild bilateral hydronephroureter and a 7 mm nonobstructing left renal interpolar calculus seen.  Thickened  appearance of the colon seen without evidence of bowel obstruction.  Large hiatal hernia also noted.  Per EDP, melena present on rectal exam which is Hemoccult positive.  Patient was started on broad-spectrum empiric antibiotics with IV vancomycin, cefepime and Flagyl.  Patient was given IV Protonix bolus and started on continuous infusion.  Patient received 1 L normal saline.  Order for transfuse 2 units PRBCs placed. EDP discussed with on-call GI who is aware of patient. The hospitalist service was consulted to admit for further evaluation and management.  1/25-creatine down , Hg 7.3. pt interacts but very tired this am.   Consultants:   gi  Procedures:   Antimicrobials:   Cefepime, metronidazole, and vanco>>>>    Subjective: Lying in bed , reports thinks breathing is "ok". Reports being tired  Objective: Vitals:   09/19/20 0700 09/19/20 0730 09/19/20 0800 09/19/20 0830  BP: (!) 133/55 (!) 126/56 (!) 142/49 (!) 141/48  Pulse: 74 75 63 65  Resp:   14 (!) 21  Temp:      TempSrc:      SpO2: 99% 100% 100% 100%  Weight:      Height:        Intake/Output Summary (Last 24 hours) at 09/19/2020 0910 Last data filed at 09/19/2020 0527 Gross per 24 hour  Intake 1095.83 ml  Output -  Net 1095.83 ml   Filed Weights   09/18/20 1647  Weight: 69.9 kg    Examination:  General exam: Appears calm and comfortable  Respiratory system: decrease bs at bases, no rales Cardiovascular system: S1 & S2 heard, RRR. No JVD, murmurs, rubs, gallops or clicks.  Gastrointestinal system: Abdomen is  nondistended, soft and nontender.  Normal bowel sounds heard. Central nervous system:grossly intact Extremities: no edema Psychiatry:. Mood & affect appropriate in current setting.     Data Reviewed: I have personally reviewed following labs and imaging studies  CBC: Recent Labs  Lab 09/18/20 1655 09/19/20 0441  WBC 26.5* 25.0*  NEUTROABS 24.9*  --   HGB 6.0* 7.3*  HCT 19.9* 23.0*  MCV  90.0 87.8  PLT 420* 300   Basic Metabolic Panel: Recent Labs  Lab 09/18/20 1655 09/19/20 0441  NA 148* 146*  K 4.1 3.1*  CL 115* 118*  CO2 20* 18*  GLUCOSE 163* 155*  BUN 76* 67*  CREATININE 1.88* 1.61*  CALCIUM 8.7* 7.2*  MG  --  2.2   GFR: Estimated Creatinine Clearance: 29.5 mL/min (A) (by C-G formula based on SCr of 1.61 mg/dL (H)). Liver Function Tests: Recent Labs  Lab 09/18/20 1655  AST 38  ALT 17  ALKPHOS 39  BILITOT 0.6  PROT 5.9*  ALBUMIN 2.2*   No results for input(s): LIPASE, AMYLASE in the last 168 hours. No results for input(s): AMMONIA in the last 168 hours. Coagulation Profile: Recent Labs  Lab 09/18/20 1911 09/19/20 0441  INR 1.2 1.1   Cardiac Enzymes: No results for input(s): CKTOTAL, CKMB, CKMBINDEX, TROPONINI in the last 168 hours. BNP (last 3 results) No results for input(s): PROBNP in the last 8760 hours. HbA1C: No results for input(s): HGBA1C in the last 72 hours. CBG: No results for input(s): GLUCAP in the last 168 hours. Lipid Profile: No results for input(s): CHOL, HDL, LDLCALC, TRIG, CHOLHDL, LDLDIRECT in the last 72 hours. Thyroid Function Tests: No results for input(s): TSH, T4TOTAL, FREET4, T3FREE, THYROIDAB in the last 72 hours. Anemia Panel: No results for input(s): VITAMINB12, FOLATE, FERRITIN, TIBC, IRON, RETICCTPCT in the last 72 hours. Sepsis Labs: Recent Labs  Lab 09/18/20 1655 09/18/20 1656 09/18/20 1911 09/19/20 0441  PROCALCITON <0.10  --   --  0.17  LATICACIDVEN  --  2.9* 1.6  --     Recent Results (from the past 240 hour(s))  Blood culture (routine x 2)     Status: None (Preliminary result)   Collection Time: 09/18/20  4:56 PM   Specimen: BLOOD  Result Value Ref Range Status   Specimen Description BLOOD LEFT ANTECUBITAL  Final   Special Requests   Final    BOTTLES DRAWN AEROBIC AND ANAEROBIC Blood Culture adequate volume   Culture   Final    NO GROWTH < 24 HOURS Performed at [facility],  [address] [place], [place] [address]    Report Status PENDING  Incomplete  Blood culture (routine x 2)     Status: None (Preliminary result)   Collection Time: 09/18/20  5:01 PM   Specimen: BLOOD  Result Value Ref Range Status   Specimen Description BLOOD BLOOD RIGHT FOREARM  Final   Special Requests   Final    BOTTLES DRAWN AEROBIC AND ANAEROBIC Blood Culture adequate volume   Culture   Final    NO GROWTH < 24 HOURS Performed at [facility], [address] [place], [place] [address]    Report Status PENDING  Incomplete  Gastrointestinal Panel by PCR , Stool     Status: None   Collection Time: 09/18/20  5:06 PM   Specimen: Stool  Result Value Ref Range Status   Campylobacter species NOT DETECTED NOT DETECTED Final   Plesimonas shigelloides NOT DETECTED NOT DETECTED Final   Salmonella  species NOT DETECTED NOT DETECTED Final   Yersinia enterocolitica NOT DETECTED NOT DETECTED Final   Vibrio species NOT DETECTED NOT DETECTED Final   Vibrio cholerae NOT DETECTED NOT DETECTED Final   Enteroaggregative E coli (EAEC) NOT DETECTED NOT DETECTED Final   Enteropathogenic E coli (EPEC) NOT DETECTED NOT DETECTED Final   Enterotoxigenic E coli (ETEC) NOT DETECTED NOT DETECTED Final   Shiga like toxin producing E coli (STEC) NOT DETECTED NOT DETECTED Final   Shigella/Enteroinvasive E coli (EIEC) NOT DETECTED NOT DETECTED Final   Cryptosporidium NOT DETECTED NOT DETECTED Final   Cyclospora cayetanensis NOT DETECTED NOT DETECTED Final   Entamoeba histolytica NOT DETECTED NOT DETECTED Final   Giardia lamblia NOT DETECTED NOT DETECTED Final   Adenovirus F40/41 NOT DETECTED NOT DETECTED Final   Astrovirus NOT DETECTED NOT DETECTED Final   Norovirus GI/GII NOT DETECTED NOT DETECTED Final   Rotavirus A NOT DETECTED NOT DETECTED Final   Sapovirus (I, II, IV, and V) NOT DETECTED NOT DETECTED Final    Comment: Performed at [facility], [address] [place], [place]  [address]  C Difficile Quick Screen (NO PCR Reflex)     Status: None   Collection Time: 09/18/20  5:07 PM   Specimen: Stool  Result Value Ref Range Status   C Diff antigen NEGATIVE NEGATIVE Final   C Diff toxin NEGATIVE NEGATIVE Final   C Diff interpretation No C. difficile detected.  Final    Comment: Performed at [facility], [address], [place], [place] [address]         Radiology Studies: CT Angio Chest PE W and/or Wo Contrast  Result Date: 09/18/2020 CLINICAL DATA:  87 year old male with recent diagnosis of COVID-19. Concern for pulmonary embolism. Diarrhea. EXAM: CT ANGIOGRAPHY CHEST CT ABDOMEN AND PELVIS WITH CONTRAST TECHNIQUE: Multidetector CT imaging of the chest was performed using the standard protocol during bolus administration of intravenous contrast. Multiplanar CT image reconstructions and MIPs were obtained to evaluate the vascular anatomy. Multidetector CT imaging of the abdomen and pelvis was performed using the standard protocol during bolus administration of intravenous contrast. CONTRAST:  60mL OMNIPAQUE IOHEXOL 350 MG/ML SOLN COMPARISON:  Chest CT dated 02/24/2008 and CT abdomen pelvis dated 09/27/2013. FINDINGS: CTA CHEST FINDINGS Cardiovascular: There is no cardiomegaly or pericardial effusion. Three-vessel coronary vascular calcification. Advanced atherosclerotic calcification of the thoracic aorta. No aneurysmal dilatation or dissection. Evaluation of the pulmonary arteries is limited due to respiratory motion artifact. No pulmonary artery embolus identified. Mediastinum/Nodes: There is no hilar or mediastinal adenopathy. There is a large hiatal hernia. The esophagus is grossly unremarkable. No mediastinal fluid collection. Lungs/Pleura: Background of emphysema. Bilateral confluent ground-glass opacities, right greater left most consistent with multifocal pneumonia and in keeping with provided history of COVID-19. Clinical correlation is recommended. There is  no pleural effusion pneumothorax. The central airways are patent. Musculoskeletal: Osteopenia with degenerative changes of the spine. Old healed right rib fractures. No acute osseous pathology. Review of the MIP images confirms the above findings. CT ABDOMEN and PELVIS FINDINGS No intra-abdominal free air or free fluid. Hepatobiliary: Multiple hepatic hypodense lesions measure up to 2.3 cm in the left lobe of the liver. The larger lesions demonstrate fluid attenuation consistent with cysts and smaller lesions are too small to characterize. No intrahepatic biliary dilatation. The gallbladder is unremarkable. Pancreas: Unremarkable. No pancreatic ductal dilatation or surrounding inflammatory changes. Spleen: Normal in size without focal abnormality. Adrenals/Urinary Tract: The adrenal glands unremarkable. There is mild bilateral  hydronephroureter. There is a 2.5 cm right renal cyst. There is a 7 mm nonobstructing left renal interpolar calculus. There is mild right perinephric stranding with apparent enhancement of the urothelium of the right renal collecting system and ureter right ureter. Correlation with urinalysis recommended to evaluate for UTI. The urinary bladder is distended. Linear calcific density along the right posterior bladder wall may represent layering stone or focal calcification of the bladder wall. Stomach/Bowel: There is a large hiatal hernia. There is no bowel obstruction. There is loose stool throughout the colon consistent with provided history of diarrheal state. Thickened appearance of the colon may be related to underdistention or represent mild colitis. Clinical correlation is recommended. The appendix is normal. Vascular/Lymphatic: Advanced aortoiliac atherosclerotic disease. The IVC is unremarkable. No portal venous gas. There is no adenopathy. Reproductive: The prostate and seminal vesicles are grossly unremarkable. Other: None Musculoskeletal: Osteopenia with degenerative changes of the  spine and scoliosis. Lower lumbar posterior fusion. No acute osseous pathology. Review of the MIP images confirms the above findings. IMPRESSION: 1. No CT evidence of pulmonary embolism. 2. Multifocal pneumonia in keeping with provided history of COVID-19. Clinical correlation is recommended. 3. Mild bilateral hydronephroureter. Correlation with urinalysis recommended to evaluate for UTI. 4. A 7 mm nonobstructing left renal interpolar calculus. 5. Diarrheal state. Thickened appearance of the colon may be related to underdistention or represent mild colitis. Clinical correlation is recommended. No bowel obstruction. Normal appendix. 6. Large hiatal hernia. 7. Aortic Atherosclerosis (ICD10-I70.0) and Emphysema (ICD10-J43.9). Electronically Signed   By: Elgie Collard M.D.   On: 09/18/2020 19:12   CT ABDOMEN PELVIS W CONTRAST  Result Date: 09/18/2020 CLINICAL DATA:  87 year old male with recent diagnosis of COVID-19. Concern for pulmonary embolism. Diarrhea. EXAM: CT ANGIOGRAPHY CHEST CT ABDOMEN AND PELVIS WITH CONTRAST TECHNIQUE: Multidetector CT imaging of the chest was performed using the standard protocol during bolus administration of intravenous contrast. Multiplanar CT image reconstructions and MIPs were obtained to evaluate the vascular anatomy. Multidetector CT imaging of the abdomen and pelvis was performed using the standard protocol during bolus administration of intravenous contrast. CONTRAST:  60mL OMNIPAQUE IOHEXOL 350 MG/ML SOLN COMPARISON:  Chest CT dated 02/24/2008 and CT abdomen pelvis dated 09/27/2013. FINDINGS: CTA CHEST FINDINGS Cardiovascular: There is no cardiomegaly or pericardial effusion. Three-vessel coronary vascular calcification. Advanced atherosclerotic calcification of the thoracic aorta. No aneurysmal dilatation or dissection. Evaluation of the pulmonary arteries is limited due to respiratory motion artifact. No pulmonary artery embolus identified. Mediastinum/Nodes: There is no  hilar or mediastinal adenopathy. There is a large hiatal hernia. The esophagus is grossly unremarkable. No mediastinal fluid collection. Lungs/Pleura: Background of emphysema. Bilateral confluent ground-glass opacities, right greater left most consistent with multifocal pneumonia and in keeping with provided history of COVID-19. Clinical correlation is recommended. There is no pleural effusion pneumothorax. The central airways are patent. Musculoskeletal: Osteopenia with degenerative changes of the spine. Old healed right rib fractures. No acute osseous pathology. Review of the MIP images confirms the above findings. CT ABDOMEN and PELVIS FINDINGS No intra-abdominal free air or free fluid. Hepatobiliary: Multiple hepatic hypodense lesions measure up to 2.3 cm in the left lobe of the liver. The larger lesions demonstrate fluid attenuation consistent with cysts and smaller lesions are too small to characterize. No intrahepatic biliary dilatation. The gallbladder is unremarkable. Pancreas: Unremarkable. No pancreatic ductal dilatation or surrounding inflammatory changes. Spleen: Normal in size without focal abnormality. Adrenals/Urinary Tract: The adrenal glands unremarkable. There is mild bilateral hydronephroureter. There is a 2.5  cm right renal cyst. There is a 7 mm nonobstructing left renal interpolar calculus. There is mild right perinephric stranding with apparent enhancement of the urothelium of the right renal collecting system and ureter right ureter. Correlation with urinalysis recommended to evaluate for UTI. The urinary bladder is distended. Linear calcific density along the right posterior bladder wall may represent layering stone or focal calcification of the bladder wall. Stomach/Bowel: There is a large hiatal hernia. There is no bowel obstruction. There is loose stool throughout the colon consistent with provided history of diarrheal state. Thickened appearance of the colon may be related to  underdistention or represent mild colitis. Clinical correlation is recommended. The appendix is normal. Vascular/Lymphatic: Advanced aortoiliac atherosclerotic disease. The IVC is unremarkable. No portal venous gas. There is no adenopathy. Reproductive: The prostate and seminal vesicles are grossly unremarkable. Other: None Musculoskeletal: Osteopenia with degenerative changes of the spine and scoliosis. Lower lumbar posterior fusion. No acute osseous pathology. Review of the MIP images confirms the above findings. IMPRESSION: 1. No CT evidence of pulmonary embolism. 2. Multifocal pneumonia in keeping with provided history of COVID-19. Clinical correlation is recommended. 3. Mild bilateral hydronephroureter. Correlation with urinalysis recommended to evaluate for UTI. 4. A 7 mm nonobstructing left renal interpolar calculus. 5. Diarrheal state. Thickened appearance of the colon may be related to underdistention or represent mild colitis. Clinical correlation is recommended. No bowel obstruction. Normal appendix. 6. Large hiatal hernia. 7. Aortic Atherosclerosis (ICD10-I70.0) and Emphysema (ICD10-J43.9). Electronically Signed   By: Elgie Collard M.D.   On: 09/18/2020 19:12   DG Chest Portable 1 View  Result Date: 09/18/2020 CLINICAL DATA:  87 year old male with shortness of breath. EXAM: PORTABLE CHEST 1 VIEW COMPARISON:  Chest radiograph dated 09/06/2020. FINDINGS: Interval improvement in bilateral pulmonary opacities with residual streaky densities which may represent residual infiltrate or post inflammatory changes. No consolidative changes. There is no pleural effusion pneumothorax. Stable cardiomegaly. Atherosclerotic calcification of the aorta. Moderate size hiatal hernia. No acute osseous pathology. IMPRESSION: Interval improvement in bilateral pulmonary opacities with residual streaky densities which may represent residual infiltrate or post inflammatory changes. Electronically Signed   By: Elgie Collard M.D.   On: 09/18/2020 17:38        Scheduled Meds: . [START ON 09/22/2020] pantoprazole  40 mg Intravenous Q12H   Continuous Infusions: . ceFEPime (MAXIPIME) IV    . dextrose 100 mL/hr at 09/19/20 0737  . metronidazole Stopped (09/19/20 0539)  . pantoprozole (PROTONIX) infusion 8 mg/hr (09/19/20 0737)  . [START ON 09/20/2020] vancomycin      Assessment & Plan:   Principal Problem:   Acute blood loss anemia Active Problems:   Pneumonia due to COVID-19 virus   Dementia (HCC)   Essential hypertension   History of CVA (cerebrovascular accident)   Hypernatremia   Systemic inflammatory response syndrome (SIRS) associated with organ dysfunction (HCC)   AKI (acute kidney injury) (HCC)   YORK VALLIANT is a 87 y.o. male with medical history significant for dementia, history of CVA, hypertension, recent admission for COVID-19 pneumonia who is admitted with symptomatic acute blood loss anemia due to GI bleed.  Symptomatic acute blood loss anemia due to GI bleed: Hemoglobin dropped from 12.0 >>6.0 since 09/10/2020. Melenic Hemoccult positive diarrhea present on arrival concerning for upper GI bleed. -Transfused 2 units PRBCs 1/25-c.diff and GI panel negative GI consulted, input appreciated-due to elevated wbc, requirement of supplemental 02, and PNA multif. On CT with recent covid infection, would make the EGD higher  risk in this pt Medical optimization for now allowing time prior to EGD may help Family apprehensive about procedures, but would like to discuss amongest themselves before consening Hemodynamically stable Continue iv ppi bid Monitor serial cbc Transfuse if Hg 7 or less Maintain 2 large bore iv line Page GI if acute hemodynamic changes or s/sx of active GIB -Hold home aspirin and pharmacologic VTE prophylaxis   SIRS: Patient with tachypnea, leukocytosis, lactic acidosis on arrival without obvious acute infectious source.  Pulmonary infiltrates are seen  related to recent Covid pneumonia. C. difficile and GI pathogen panels are negative 1/25- wbc improving mildy.  Continue empiric abx for now with iv vanco, cefepime, and flagyl F/u bcx ucx    Acute kidney injury: Likely related to GI bleeding and volume loss. 1/25-improving with ivf for hydration Hold NSAIDS and ACEI   Hypokalemia-will replace, monitor levels  Hypernatremia: Sodium 148 secondary to dehydration.  Improving with hydration   Diarrhea: Suspect related to GI bleed as C. difficile and GI pathogen panels are negative. Viral gastroenteritis also possible. Continue supportive care.  Hypertension: BP currently stable. Hold home amlodipine and lisinopril as he has at risk for becoming hypotensive.  History of CVA: Hold aspirin with ongoing GI bleed.  Recent admission for COVID-19 pneumonia: SARS-CoV-2 PCR + 09/06/2020. Just admitted and discharged 09/12/2020 at which time he completed IV remdesivir, steroids, ceftriaxone/azithromycin. He was requiring 2 L O2 via [place] on discharge. Currently maintaining O2 saturations on 1.5-2 L .  Dementia: Chronic, seems to be declining since his wife passed away per son. Resume donepezil when able.     DVT prophylaxis: SCD Code Status:dnr Family Communication: updated son  Status is: Inpatient  Remains inpatient appropriate because:Inpatient level of care appropriate due to severity of illness   Dispo: The patient is from: SNF              Anticipated d/c is to: SNF              Anticipated d/c date is: 3 days              Patient currently is not medically stable to d/c.   Difficult to place patient No            LOS: 1 day   Time spent: with >50% on coc    Lynn Ito, MD [facility] Pager [phone]  If 7PM-7AM, please contact night-coverage 09/19/2020, 9:10 AM

## 2020-09-19 NOTE — ED Notes (Signed)
Patient sleeping at this time. No signs of distress. Will continue to monitor. 

## 2020-09-19 NOTE — ED Notes (Signed)
pericare provided. Pt incontinent of stool.

## 2020-09-20 DIAGNOSIS — N179 Acute kidney failure, unspecified: Secondary | ICD-10-CM

## 2020-09-20 DIAGNOSIS — E870 Hyperosmolality and hypernatremia: Secondary | ICD-10-CM

## 2020-09-20 DIAGNOSIS — D62 Acute posthemorrhagic anemia: Secondary | ICD-10-CM | POA: Diagnosis not present

## 2020-09-20 DIAGNOSIS — J1282 Pneumonia due to coronavirus disease 2019: Secondary | ICD-10-CM | POA: Diagnosis not present

## 2020-09-20 DIAGNOSIS — U071 COVID-19: Secondary | ICD-10-CM | POA: Diagnosis not present

## 2020-09-20 DIAGNOSIS — K921 Melena: Secondary | ICD-10-CM | POA: Diagnosis not present

## 2020-09-20 LAB — TYPE AND SCREEN
ABO/RH(D): O POS
Antibody Screen: NEGATIVE
Unit division: 0
Unit division: 0

## 2020-09-20 LAB — HEMOGLOBIN AND HEMATOCRIT, BLOOD
HCT: 24.4 % — ABNORMAL LOW (ref 39.0–52.0)
HCT: 23.3 % — ABNORMAL LOW (ref 39.0–52.0)
Hemoglobin: 7.7 g/dL — ABNORMAL LOW (ref 13.0–17.0)
Hemoglobin: 7.4 g/dL — ABNORMAL LOW (ref 13.0–17.0)

## 2020-09-20 LAB — BPAM RBC
Blood Product Expiration Date: 202202262359
Blood Product Expiration Date: 202202262359
ISSUE DATE / TIME: 202201242115
ISSUE DATE / TIME: 202201250148
Unit Type and Rh: 5100
Unit Type and Rh: 5100

## 2020-09-20 LAB — CBC
HCT: 24.8 % — ABNORMAL LOW (ref 39.0–52.0)
Hemoglobin: 7.8 g/dL — ABNORMAL LOW (ref 13.0–17.0)
MCH: 27.9 pg (ref 26.0–34.0)
MCHC: 31.5 g/dL (ref 30.0–36.0)
MCV: 88.6 fL (ref 80.0–100.0)
Platelets: 306 10*3/uL (ref 150–400)
RBC: 2.8 MIL/uL — ABNORMAL LOW (ref 4.22–5.81)
RDW: 17.2 % — ABNORMAL HIGH (ref 11.5–15.5)
WBC: 20.9 10*3/uL — ABNORMAL HIGH (ref 4.0–10.5)
nRBC: 0 % (ref 0.0–0.2)

## 2020-09-20 LAB — COMPREHENSIVE METABOLIC PANEL
ALT: 16 U/L (ref 0–44)
AST: 32 U/L (ref 15–41)
Albumin: 1.9 g/dL — ABNORMAL LOW (ref 3.5–5.0)
Alkaline Phosphatase: 42 U/L (ref 38–126)
Anion gap: 8 (ref 5–15)
BUN: 62 mg/dL — ABNORMAL HIGH (ref 8–23)
CO2: 18 mmol/L — ABNORMAL LOW (ref 22–32)
Calcium: 8.1 mg/dL — ABNORMAL LOW (ref 8.9–10.3)
Chloride: 122 mmol/L — ABNORMAL HIGH (ref 98–111)
Creatinine, Ser: 1.74 mg/dL — ABNORMAL HIGH (ref 0.61–1.24)
GFR, Estimated: 37 mL/min — ABNORMAL LOW (ref >60)
Glucose, Bld: 85 mg/dL (ref 70–99)
Potassium: 4.7 mmol/L (ref 3.5–5.1)
Sodium: 148 mmol/L — ABNORMAL HIGH (ref 135–145)
Total Bilirubin: 0.5 mg/dL (ref 0.3–1.2)
Total Protein: 5.3 g/dL — ABNORMAL LOW (ref 6.5–8.1)

## 2020-09-20 LAB — URINE CULTURE: Culture: NO GROWTH

## 2020-09-20 LAB — PROCALCITONIN: Procalcitonin: 0.15 ng/mL

## 2020-09-20 MED ORDER — DEXTROSE 5 % IV SOLN: INTRAVENOUS | Status: DC

## 2020-09-20 MED ORDER — DONEPEZIL HCL 5 MG PO TABS
5.0000 mg | ORAL_TABLET | Freq: Every day | ORAL | Status: DC
  Administered 2020-09-20 – 2020-09-28 (×9): 5 mg via ORAL
  Filled 2020-09-20 (×10): qty 1

## 2020-09-20 NOTE — Progress Notes (Signed)
Patient resting in bed. Has not complained of any pain or distress. Oxygen is at 2L [place]. He is alert with stimulus. Has been sleeping most of morning, given in report patient slept all night without disruption. Spoke with Dr. Denton Lank this morning, she stated this is patient's norm. Bed in low position and call bell in reach. Attempted to call family, no one answered, will try again later.

## 2020-09-20 NOTE — Evaluation (Signed)
Physical Therapy Evaluation Patient Details Name: David [name] MRN: [id number] DOB: 1927-01-19 Today's Date: 09/20/2020   History of Present Illness  David [name] is a 89yoF who comes to [facility] from [facility]. Patient recently admitted from 09/06/2020-09/12/2020 for acute hypoxic respiratory failure due to COVID-19 pneumonia and CAP. He was also noted to have orthostatic hypotension while in the hospital. PMH: dementia, history of CVA, hypertension, recent admission for COVID-19 pneumonia.  Clinical Impression  Pt admitted with above diagnosis. Pt currently with functional limitations due to the deficits listed below (see "PT Problem List"). Upon entry, pt in bed, heavily asleep (sun in face) and difficult to rouse in a meaningful way. The pt is lethargic throughout, hypophonic and mumbling when he does interact. PLOF taken from recent admission therapy notes. Pt expresses interest in mobilizing, but is not following commands consistently, furthermore his gross motor attempts are often incongruent with his stated attempt or received command from author. Author unable to assist this patient to sitting aat EOB due to maintained fetal position in sidelying. Pt moved to room air, down from 4L with sats in upper 90s%. RN made aware of occlusion alarm. Patient's performance this date reveals decreased ability, independence, and tolerance in performing all basic mobility required for performance of activities of daily living. Pt requires additional DME, close physical assistance, and cues for safe participate in mobility. Pt will benefit from skilled PT intervention to increase independence and safety with basic mobility in preparation for discharge to the venue listed below.       Follow Up Recommendations SNF;Supervision for mobility/OOB;Supervision/Assistance - 24 hour    Equipment Recommendations  None recommended by PT    Recommendations for Other Services       Precautions / Restrictions  Precautions Precautions: Fall Restrictions Other Position/Activity Restrictions: Orthostatic hypotension      Mobility  Bed Mobility Overal bed mobility: Needs Assistance       Supine to sit: Total assist;+2 for physical assistance Sit to supine: +2 for physical assistance;Total assist   General bed mobility comments: verbalizes motivation, inititates some of the time, but often contributes little or moves in a grossly incongruent fashion.    Transfers                    Ambulation/Gait                Stairs            Wheelchair Mobility    Modified Rankin (Stroke Patients Only)       Balance Overall balance assessment: History of Falls                                           Pertinent Vitals/Pain Pain Assessment: No/denies pain    Home Living Family/patient expects to be discharged to:: Skilled nursing facility Living Arrangements: Alone Available Help at Discharge: Family;Available PRN/intermittently Type of Home: House Home Access: Stairs to enter Entrance Stairs-Rails: Right;Left (wide) Entrance Stairs-Number of Steps: 3 Home Layout: One level Home Equipment: Walker - 2 wheels;Cane - quad;Shower seat      Prior Function Level of Independence: Needs assistance   Gait / Transfers Assistance Needed: difficulty obtaining due to lethargy  ADL's / Homemaking Assistance Needed: Pt reports that his son and DIL help him with IADLs such as cooking/cleaning and help him with bathing for safety. Pt reports otherwise  being able to complete his basic ADLs.  Comments: Endorses at least 2-3 falls in last 6 months.     Hand Dominance        Extremity/Trunk Assessment   Upper Extremity Assessment Upper Extremity Assessment: Generalized weakness    Lower Extremity Assessment Lower Extremity Assessment: Generalized weakness       Communication      Cognition Arousal/Alertness: Lethargic   Overall Cognitive  Status: No family/caregiver present to determine baseline cognitive functioning                                 General Comments: difficulty rousing, hypophonic and mumbly, not following basic commands in session.      General Comments      Exercises     Assessment/Plan    PT Assessment Patient needs continued PT services  PT Problem List Decreased strength;Decreased mobility;Decreased safety awareness;Decreased coordination;Decreased knowledge of precautions;Decreased activity tolerance;Decreased cognition;Cardiopulmonary status limiting activity;Impaired sensation;Decreased knowledge of use of DME;Decreased balance       PT Treatment Interventions      PT Goals (Current goals can be found in the Care Plan section)  Acute Rehab PT Goals Patient Stated Goal: none stated PT Goal Formulation: With patient Time For Goal Achievement: 09/22/20 Potential to Achieve Goals: Fair    Frequency Min 2X/week   Barriers to discharge Decreased caregiver support      Co-evaluation               AM-PAC PT "6 Clicks" Mobility  Outcome Measure Help needed turning from your back to your side while in a flat bed without using bedrails?: Total Help needed moving from lying on your back to sitting on the side of a flat bed without using bedrails?: Total Help needed moving to and from a bed to a chair (including a wheelchair)?: Total Help needed standing up from a chair using your arms (e.g., wheelchair or bedside chair)?: Total Help needed to walk in hospital room?: Total Help needed climbing 3-5 steps with a railing? : Total 6 Click Score: 6    End of Session Equipment Utilized During Treatment: Oxygen Activity Tolerance: Patient limited by lethargy Patient left: in bed;with call bell/phone within reach;with bed alarm set;with nursing/sitter in room Nurse Communication: Mobility status PT Visit Diagnosis: Unsteadiness on feet (R26.81);Difficulty in walking, not  elsewhere classified (R26.2);Muscle weakness (generalized) (M62.81);History of falling (Z91.81)    Time: 6440-3474 PT Time Calculation (min) (ACUTE ONLY): 25 min   Charges:   PT Evaluation $PT Eval High Complexity: 1 High          4:00 PM, 09/20/20 Rosamaria Lints, PT, DPT Physical Therapist - [facility]  [phone] (ASCOM)   Caralyn Twining C 09/20/2020, 3:54 PM

## 2020-09-20 NOTE — Progress Notes (Signed)
Melodie Bouillon, MD [address], [address], [place], [place], [address] [address], [place], [place], [address] Phone: [phone]  Fax: [phone]   Subjective: Patient resting in bed comfortably.  He answers "I do not know" to most questions due to his underlying dementia.  Last documented bowel movement was at midnight and was brown in color as documented under flowsheets   Objective: Exam: Vital signs in last 24 hours: Vitals:   09/20/20 0300 09/20/20 0829 09/20/20 1227 09/20/20 1550  BP: 135/61 (!) 144/80 (!) 131/101   Pulse: 86 76 94   Resp: 18 20 (!) 24   Temp: (!) 97.4 F (36.3 C) (!) 97.5 F (36.4 C) 97.6 F (36.4 C)   TempSrc: Axillary Oral Axillary   SpO2: 93% 94% 90% 99%  Weight:      Height:       Weight change:   Intake/Output Summary (Last 24 hours) at 09/20/2020 1609 Last data filed at 09/20/2020 0410 Gross per 24 hour  Intake 175.29 ml  Output 100 ml  Net 75.29 ml    General: No acute distress, not alert to place or time Abd: Soft, NT/ND, No HSM Skin: Warm, no rashes Neck: Supple, Trachea midline   Lab Results: Lab Results  Component Value Date   WBC 20.9 (H) 09/20/2020   HGB 7.7 (L) 09/20/2020   HCT 24.4 (L) 09/20/2020   MCV 88.6 09/20/2020   PLT 306 09/20/2020   Micro Results: Recent Results (from the past 240 hour(s))  Urine culture     Status: None   Collection Time: 09/18/20  4:50 PM   Specimen: Urine, Random  Result Value Ref Range Status   Specimen Description   Final    URINE, RANDOM Performed at [facility], [address] [place], [place] [address]    Special Requests   Final    NONE Performed at [facility], [address], [place], [place] [address]    Culture   Final    NO GROWTH Performed at [facility], [address] [place], [place] [address]    Report Status 09/20/2020 FINAL  Final  Blood culture (routine x 2)     Status: None (Preliminary result)   Collection  Time: 09/18/20  4:56 PM   Specimen: BLOOD  Result Value Ref Range Status   Specimen Description BLOOD LEFT ANTECUBITAL  Final   Special Requests   Final    BOTTLES DRAWN AEROBIC AND ANAEROBIC Blood Culture adequate volume   Culture   Final    NO GROWTH 2 DAYS Performed at [facility], [address] [place], [place] [address]    Report Status PENDING  Incomplete  Blood culture (routine x 2)     Status: None (Preliminary result)   Collection Time: 09/18/20  5:01 PM   Specimen: BLOOD  Result Value Ref Range Status   Specimen Description BLOOD BLOOD RIGHT FOREARM  Final   Special Requests   Final    BOTTLES DRAWN AEROBIC AND ANAEROBIC Blood Culture adequate volume   Culture   Final    NO GROWTH 2 DAYS Performed at [facility], [address] [place], [place] [address]    Report Status PENDING  Incomplete  Gastrointestinal Panel by PCR , Stool     Status: None   Collection Time: 09/18/20  5:06 PM   Specimen: Stool  Result Value Ref Range Status   Campylobacter species NOT DETECTED NOT DETECTED Final  Plesimonas shigelloides NOT DETECTED NOT DETECTED Final   Salmonella species NOT DETECTED NOT DETECTED Final   Yersinia enterocolitica NOT DETECTED NOT DETECTED Final   Vibrio species NOT DETECTED NOT DETECTED Final   Vibrio cholerae NOT DETECTED NOT DETECTED Final   Enteroaggregative E coli (EAEC) NOT DETECTED NOT DETECTED Final   Enteropathogenic E coli (EPEC) NOT DETECTED NOT DETECTED Final   Enterotoxigenic E coli (ETEC) NOT DETECTED NOT DETECTED Final   Shiga like toxin producing E coli (STEC) NOT DETECTED NOT DETECTED Final   Shigella/Enteroinvasive E coli (EIEC) NOT DETECTED NOT DETECTED Final   Cryptosporidium NOT DETECTED NOT DETECTED Final   Cyclospora cayetanensis NOT DETECTED NOT DETECTED Final   Entamoeba histolytica NOT DETECTED NOT DETECTED Final   Giardia lamblia NOT DETECTED NOT DETECTED Final   Adenovirus F40/41 NOT DETECTED NOT DETECTED  Final   Astrovirus NOT DETECTED NOT DETECTED Final   Norovirus GI/GII NOT DETECTED NOT DETECTED Final   Rotavirus A NOT DETECTED NOT DETECTED Final   Sapovirus (I, II, IV, and V) NOT DETECTED NOT DETECTED Final    Comment: Performed at [facility], [address] [place], [place] [address]  C Difficile Quick Screen (NO PCR Reflex)     Status: None   Collection Time: 09/18/20  5:07 PM   Specimen: Stool  Result Value Ref Range Status   C Diff antigen NEGATIVE NEGATIVE Final   C Diff toxin NEGATIVE NEGATIVE Final   C Diff interpretation No C. difficile detected.  Final    Comment: Performed at [facility], [address] [place], [place] [address]   Studies/Results: CT Angio Chest PE W and/or Wo Contrast  Result Date: 09/18/2020 CLINICAL DATA:  87 year old male with recent diagnosis of COVID-19. Concern for pulmonary embolism. Diarrhea. EXAM: CT ANGIOGRAPHY CHEST CT ABDOMEN AND PELVIS WITH CONTRAST TECHNIQUE: Multidetector CT imaging of the chest was performed using the standard protocol during bolus administration of intravenous contrast. Multiplanar CT image reconstructions and MIPs were obtained to evaluate the vascular anatomy. Multidetector CT imaging of the abdomen and pelvis was performed using the standard protocol during bolus administration of intravenous contrast. CONTRAST:  22mL OMNIPAQUE IOHEXOL 350 MG/ML SOLN COMPARISON:  Chest CT dated 02/24/2008 and CT abdomen pelvis dated 09/27/2013. FINDINGS: CTA CHEST FINDINGS Cardiovascular: There is no cardiomegaly or pericardial effusion. Three-vessel coronary vascular calcification. Advanced atherosclerotic calcification of the thoracic aorta. No aneurysmal dilatation or dissection. Evaluation of the pulmonary arteries is limited due to respiratory motion artifact. No pulmonary artery embolus identified. Mediastinum/Nodes: There is no hilar or mediastinal adenopathy. There is a large hiatal hernia. The esophagus is  grossly unremarkable. No mediastinal fluid collection. Lungs/Pleura: Background of emphysema. Bilateral confluent ground-glass opacities, right greater left most consistent with multifocal pneumonia and in keeping with provided history of COVID-19. Clinical correlation is recommended. There is no pleural effusion pneumothorax. The central airways are patent. Musculoskeletal: Osteopenia with degenerative changes of the spine. Old healed right rib fractures. No acute osseous pathology. Review of the MIP images confirms the above findings. CT ABDOMEN and PELVIS FINDINGS No intra-abdominal free air or free fluid. Hepatobiliary: Multiple hepatic hypodense lesions measure up to 2.3 cm in the left lobe of the liver. The larger lesions demonstrate fluid attenuation consistent with cysts and smaller lesions are too small to characterize. No intrahepatic biliary dilatation. The gallbladder is unremarkable. Pancreas: Unremarkable. No pancreatic ductal dilatation or surrounding inflammatory changes. Spleen: Normal in size without focal abnormality. Adrenals/Urinary Tract: The adrenal glands unremarkable. There  is mild bilateral hydronephroureter. There is a 2.5 cm right renal cyst. There is a 7 mm nonobstructing left renal interpolar calculus. There is mild right perinephric stranding with apparent enhancement of the urothelium of the right renal collecting system and ureter right ureter. Correlation with urinalysis recommended to evaluate for UTI. The urinary bladder is distended. Linear calcific density along the right posterior bladder wall may represent layering stone or focal calcification of the bladder wall. Stomach/Bowel: There is a large hiatal hernia. There is no bowel obstruction. There is loose stool throughout the colon consistent with provided history of diarrheal state. Thickened appearance of the colon may be related to underdistention or represent mild colitis. Clinical correlation is recommended. The appendix  is normal. Vascular/Lymphatic: Advanced aortoiliac atherosclerotic disease. The IVC is unremarkable. No portal venous gas. There is no adenopathy. Reproductive: The prostate and seminal vesicles are grossly unremarkable. Other: None Musculoskeletal: Osteopenia with degenerative changes of the spine and scoliosis. Lower lumbar posterior fusion. No acute osseous pathology. Review of the MIP images confirms the above findings. IMPRESSION: 1. No CT evidence of pulmonary embolism. 2. Multifocal pneumonia in keeping with provided history of COVID-19. Clinical correlation is recommended. 3. Mild bilateral hydronephroureter. Correlation with urinalysis recommended to evaluate for UTI. 4. A 7 mm nonobstructing left renal interpolar calculus. 5. Diarrheal state. Thickened appearance of the colon may be related to underdistention or represent mild colitis. Clinical correlation is recommended. No bowel obstruction. Normal appendix. 6. Large hiatal hernia. 7. Aortic Atherosclerosis (ICD10-I70.0) and Emphysema (ICD10-J43.9). Electronically Signed   By: Elgie Collard M.D.   On: 09/18/2020 19:12   CT ABDOMEN PELVIS W CONTRAST  Result Date: 09/18/2020 CLINICAL DATA:  87 year old male with recent diagnosis of COVID-19. Concern for pulmonary embolism. Diarrhea. EXAM: CT ANGIOGRAPHY CHEST CT ABDOMEN AND PELVIS WITH CONTRAST TECHNIQUE: Multidetector CT imaging of the chest was performed using the standard protocol during bolus administration of intravenous contrast. Multiplanar CT image reconstructions and MIPs were obtained to evaluate the vascular anatomy. Multidetector CT imaging of the abdomen and pelvis was performed using the standard protocol during bolus administration of intravenous contrast. CONTRAST:  60mL OMNIPAQUE IOHEXOL 350 MG/ML SOLN COMPARISON:  Chest CT dated 02/24/2008 and CT abdomen pelvis dated 09/27/2013. FINDINGS: CTA CHEST FINDINGS Cardiovascular: There is no cardiomegaly or pericardial effusion.  Three-vessel coronary vascular calcification. Advanced atherosclerotic calcification of the thoracic aorta. No aneurysmal dilatation or dissection. Evaluation of the pulmonary arteries is limited due to respiratory motion artifact. No pulmonary artery embolus identified. Mediastinum/Nodes: There is no hilar or mediastinal adenopathy. There is a large hiatal hernia. The esophagus is grossly unremarkable. No mediastinal fluid collection. Lungs/Pleura: Background of emphysema. Bilateral confluent ground-glass opacities, right greater left most consistent with multifocal pneumonia and in keeping with provided history of COVID-19. Clinical correlation is recommended. There is no pleural effusion pneumothorax. The central airways are patent. Musculoskeletal: Osteopenia with degenerative changes of the spine. Old healed right rib fractures. No acute osseous pathology. Review of the MIP images confirms the above findings. CT ABDOMEN and PELVIS FINDINGS No intra-abdominal free air or free fluid. Hepatobiliary: Multiple hepatic hypodense lesions measure up to 2.3 cm in the left lobe of the liver. The larger lesions demonstrate fluid attenuation consistent with cysts and smaller lesions are too small to characterize. No intrahepatic biliary dilatation. The gallbladder is unremarkable. Pancreas: Unremarkable. No pancreatic ductal dilatation or surrounding inflammatory changes. Spleen: Normal in size without focal abnormality. Adrenals/Urinary Tract: The adrenal glands unremarkable. There is mild bilateral hydronephroureter. There  is a 2.5 cm right renal cyst. There is a 7 mm nonobstructing left renal interpolar calculus. There is mild right perinephric stranding with apparent enhancement of the urothelium of the right renal collecting system and ureter right ureter. Correlation with urinalysis recommended to evaluate for UTI. The urinary bladder is distended. Linear calcific density along the right posterior bladder wall may  represent layering stone or focal calcification of the bladder wall. Stomach/Bowel: There is a large hiatal hernia. There is no bowel obstruction. There is loose stool throughout the colon consistent with provided history of diarrheal state. Thickened appearance of the colon may be related to underdistention or represent mild colitis. Clinical correlation is recommended. The appendix is normal. Vascular/Lymphatic: Advanced aortoiliac atherosclerotic disease. The IVC is unremarkable. No portal venous gas. There is no adenopathy. Reproductive: The prostate and seminal vesicles are grossly unremarkable. Other: None Musculoskeletal: Osteopenia with degenerative changes of the spine and scoliosis. Lower lumbar posterior fusion. No acute osseous pathology. Review of the MIP images confirms the above findings. IMPRESSION: 1. No CT evidence of pulmonary embolism. 2. Multifocal pneumonia in keeping with provided history of COVID-19. Clinical correlation is recommended. 3. Mild bilateral hydronephroureter. Correlation with urinalysis recommended to evaluate for UTI. 4. A 7 mm nonobstructing left renal interpolar calculus. 5. Diarrheal state. Thickened appearance of the colon may be related to underdistention or represent mild colitis. Clinical correlation is recommended. No bowel obstruction. Normal appendix. 6. Large hiatal hernia. 7. Aortic Atherosclerosis (ICD10-I70.0) and Emphysema (ICD10-J43.9). Electronically Signed   By: Elgie Collard M.D.   On: 09/18/2020 19:12   DG Chest Portable 1 View  Result Date: 09/18/2020 CLINICAL DATA:  87 year old male with shortness of breath. EXAM: PORTABLE CHEST 1 VIEW COMPARISON:  Chest radiograph dated 09/06/2020. FINDINGS: Interval improvement in bilateral pulmonary opacities with residual streaky densities which may represent residual infiltrate or post inflammatory changes. No consolidative changes. There is no pleural effusion pneumothorax. Stable cardiomegaly. Atherosclerotic  calcification of the aorta. Moderate size hiatal hernia. No acute osseous pathology. IMPRESSION: Interval improvement in bilateral pulmonary opacities with residual streaky densities which may represent residual infiltrate or post inflammatory changes. Electronically Signed   By: Elgie Collard M.D.   On: 09/18/2020 17:38   Medications:  Scheduled Meds: . acidophilus  2 capsule Oral TID  . [START ON 09/22/2020] pantoprazole  40 mg Intravenous Q12H   Continuous Infusions: . ceFEPime (MAXIPIME) IV Stopped (09/19/20 2230)  . metronidazole 500 mg (09/20/20 1300)  . pantoprozole (PROTONIX) infusion 8 mg/hr (09/20/20 1057)  . vancomycin     PRN Meds:.ondansetron **OR** ondansetron (ZOFRAN) IV   Assessment: Principal Problem:   Acute blood loss anemia Active Problems:   Pneumonia due to COVID-19 virus   Dementia (HCC)   Essential hypertension   History of CVA (cerebrovascular accident)   Hypernatremia   Systemic inflammatory response syndrome (SIRS) associated with organ dysfunction (HCC)   AKI (acute kidney injury) (HCC)    Plan: No signs of active bleeding in 24 hours Hemoglobin remains low, but has not dropped to the level that he came in at which was 6.  It is currently 7.8.  His white count has improved today to 20.9 compared to 26.5, 2 days ago  As per physical therapy note today "patient moved to room air, down from 4 L with sats in upper 90s".  If his oxygen requirements continue to improve, white count continues to improve, and patient's family agreeable to endoscopy, can consider endoscopy in 1 to 2 days to  evaluate for source of melena and anemia on presentation  PPI IV twice daily  Continue serial CBCs and transfuse PRN Avoid NSAIDs Maintain 2 large-bore IV lines Please page GI with any acute hemodynamic changes, or signs of active GI bleeding    LOS: 2 days   Melodie Bouillon, MD 09/20/2020, 4:09 PM

## 2020-09-20 NOTE — Hospital Course (Addendum)
David [name] is a 87 y.o. male with medical history significant for dementia, history of CVA, hypertension, recent admission for COVID-19 pneumonia who presents to the ED from SNF for evaluation of shortness of breath and diarrhea.  History is limited from patient due to underlying dementia and is otherwise supplemented by EDP, chart review, and patient's son by phone.   Patient recently admitted from 09/06/2020-09/12/2020 for acute hypoxic respiratory failure due to COVID-19 pneumonia and CAP.  Patient was treated with IV remdesivir, steroids, IV ceftriaxone/azithromycin.  He was discharged on 2 L of home O2 via [place] to SNF.  He was also noted to have orthostatic hypotension while in the hospital.   Patient returns to the ED from SNF due to reported shortness of breath and loose stools. Per ED triage notes patient was brought in via EMS on 100% NRB but switched to 3 L supplemental O2 via [place] on arrival. Patient complains of diarrhea and abdominal pain but is not able to provide further history.     Labs notable for BUN 76, creatinine 1.88 (1.08 on 09/11/2020), hemoglobin 6.0 (12.0 on 09/10/2020), WBC 26.5, hs-troponin 43, lactic acid 2.9 > 1.6. C. difficile and GI pathogen panels both negative.   Chest x-ray with interval improvement in bilateral pulmonary opacities from prior.   CTA chest/abdomen/pelvis obtained.  Negative for evidence of PE.  Bilateral groundglass opacities seen consistent with recent COVID-19 pneumonia.  Mild bilateral hydronephroureter and a 7 mm nonobstructing left renal interpolar calculus seen.  Thickened appearance of the colon seen without evidence of bowel obstruction.  Large hiatal hernia also noted.   Per EDP, melena present on rectal exam which is Hemoccult positive.  Patient was started on broad-spectrum empiric antibiotics with IV vancomycin, cefepime and Flagyl.  Patient was given IV Protonix bolus and started on continuous infusion.  Patient received 1 L normal saline.   Order for transfuse 2 units PRBCs placed. EDP discussed with on-call GI who is aware of patient. The hospitalist service was consulted to admit for further evaluation and management.

## 2020-09-20 NOTE — TOC Initial Note (Addendum)
Transition of Care [facility]) - Initial/Assessment Note    Patient Details  Name: David [name] MRN: [id number] Date of Birth: 05/18/1927  Transition of Care [facility]) CM/SW Contact:    David Cline, LCSW Phone Number: 09/20/2020, 1:42 PM  Clinical Narrative:            Patient disoriented per chart review. CSW called and spoke with daughter in law David [name]. David [name] reported patient was at [facility] prior to hospitalization for SNF rehab. Prior to that, he was living alone with family checking on him routinely. Their plan is for patient to return to [facility] for more rehab at [place]. DO ordering PT/OT evals.       Expected Discharge Plan: Skilled Nursing Facility Barriers to Discharge: Continued Medical Work up   Patient Goals and CMS Choice Patient states their goals for this hospitalization and ongoing recovery are:: SNF rehab CMS Medicare.gov Compare Post Acute Care list provided to:: Patient Represenative (must comment) Choice offered to / list presented to : Adult Children  Expected Discharge Plan and Services Expected Discharge Plan: Skilled Nursing Facility       Living arrangements for the past 2 months: Single Family Home                                      Prior Living Arrangements/Services Living arrangements for the past 2 months: Single Family Home Lives with:: Self Patient language and need for interpreter reviewed:: Yes Do you feel safe going back to the place where you live?: Yes      Need for Family Participation in Patient Care: Yes (Comment) Care giver support system in place?: Yes (comment) Current home services: DME Criminal Activity/Legal Involvement Pertinent to Current Situation/Hospitalization: No - Comment as needed  Activities of Daily Living Home Assistive Devices/Equipment: Walker (specify type) ADL Screening (condition at time of admission) Patient's cognitive ability adequate to safely complete daily activities?: No Is the patient deaf or  have difficulty hearing?: No Does the patient have difficulty seeing, even when wearing glasses/contacts?: No Does the patient have difficulty concentrating, remembering, or making decisions?: Yes Patient able to express need for assistance with ADLs?: No Does the patient have difficulty dressing or bathing?: Yes Independently performs ADLs?: No Communication: Dependent Is this a change from baseline?: Pre-admission baseline Dressing (OT): Dependent Is this a change from baseline?: Pre-admission baseline Grooming: Dependent Is this a change from baseline?: Pre-admission baseline Feeding: Independent Bathing: Dependent Is this a change from baseline?: Pre-admission baseline Toileting: Needs assistance Is this a change from baseline?: Pre-admission baseline In/Out Bed: Needs assistance Is this a change from baseline?: Pre-admission baseline Walks in Home: Needs assistance Is this a change from baseline?: Pre-admission baseline Does the patient have difficulty walking or climbing stairs?: Yes Weakness of Legs: Both Weakness of Arms/Hands: None  Permission Sought/Granted Permission sought to share information with : Oceanographer granted to share information with : Yes, Verbal Permission Granted (by daughter in Armed forces operational officer)     Permission granted to share info w AGENCY: SNFs        Emotional Assessment       Orientation: : Fluctuating Orientation (Suspected and/or reported Sundowners) Alcohol / Substance Use: Not Applicable Psych Involvement: No (comment)  Admission diagnosis:  Acute blood loss anemia [D62] AKI (acute kidney injury) (HCC) [N17.9] Symptomatic anemia [D64.9] Gastrointestinal hemorrhage, unspecified gastrointestinal hemorrhage type [K92.2] Pneumonia due to COVID-19  virus [U07.1, J12.82] Patient Active Problem List   Diagnosis Date Noted  . Acute blood loss anemia 09/18/2020  . Dementia (HCC)   . Essential hypertension   . History  of CVA (cerebrovascular accident)   . Hypernatremia   . Systemic inflammatory response syndrome (SIRS) associated with organ dysfunction (HCC)   . AKI (acute kidney injury) (HCC)   . Pneumonia due to COVID-19 virus 09/06/2020  . Acute hypoxemic respiratory failure (HCC) 09/06/2020  . Bilateral carotid artery stenosis 06/11/2017  . Dizziness 08/08/2016  . Syncopal episodes 08/08/2016   PCP:  David [name] [facility] Pharmacy:   [facility] [address], [place] - [address] [place] [address] Phone: [phone] Fax: [phone]     Social Determinants of Health (SDOH) Interventions    Readmission Risk Interventions No flowsheet data found.

## 2020-09-20 NOTE — Progress Notes (Signed)
PROGRESS NOTE    David [name]   YQI:[id number]  DOB: [birth year]  PCP: [facility]    DOA: 09/18/2020 LOS: 2   Brief Narrative   David [name] is a 87 y.o. male with medical history significant for dementia, history of CVA, hypertension, recent admission for COVID-19 pneumonia who presents to the ED from SNF for evaluation of shortness of breath and diarrhea.  History is limited from patient due to underlying dementia and is otherwise supplemented by EDP, chart review, and patient's son by phone.   Patient recently admitted from 09/06/2020-09/12/2020 for acute hypoxic respiratory failure due to COVID-19 pneumonia and CAP.  Patient was treated with IV remdesivir, steroids, IV ceftriaxone/azithromycin.  He was discharged on 2 L of home O2 via [place] to SNF.  He was also noted to have orthostatic hypotension while in the hospital.   Patient returns to the ED from SNF due to reported shortness of breath and loose stools. Per ED triage notes patient was brought in via EMS on 100% NRB but switched to 3 L supplemental O2 via  on arrival. Patient complains of diarrhea and abdominal pain but is not able to provide further history.     Labs notable for BUN 76, creatinine 1.88 (1.08 on 09/11/2020), hemoglobin 6.0 (12.0 on 09/10/2020), WBC 26.5, hs-troponin 43, lactic acid 2.9 > 1.6. C. difficile and GI pathogen panels both negative.   Chest x-ray with interval improvement in bilateral pulmonary opacities from prior.   CTA chest/abdomen/pelvis obtained.  Negative for evidence of PE.  Bilateral groundglass opacities seen consistent with recent COVID-19 pneumonia.  Mild bilateral hydronephroureter and a 7 mm nonobstructing left renal interpolar calculus seen.  Thickened appearance of the colon seen without evidence of bowel obstruction.  Large hiatal hernia also noted.   Per EDP, melena present on rectal exam which is Hemoccult positive.  Patient was started on broad-spectrum empiric antibiotics  with IV vancomycin, cefepime and Flagyl.  Patient was given IV Protonix bolus and started on continuous infusion.  Patient received 1 L normal saline.  Order for transfuse 2 units PRBCs placed. EDP discussed with on-call GI who is aware of patient. The hospitalist service was consulted to admit for further evaluation and management.     Assessment & Plan   Principal Problem:   Acute blood loss anemia Active Problems:   Pneumonia due to COVID-19 virus   Dementia (HCC)   Essential hypertension   History of CVA (cerebrovascular accident)   Hypernatremia   Systemic inflammatory response syndrome (SIRS) associated with organ dysfunction (HCC)   AKI (acute kidney injury) (HCC)   Symptomatic acute blood loss anemia due to GI bleed - POA with Hbg 6.0, down from 12.0 at recent discharge.  With melena and hemoccult positive stool in ED.   Transfused 2 units PRBCs. GI consulted, input appreciated. Family considering whether or not to pursue endoscopy, and pt felt would be high risk for anesthesia given currently recovering from Covid PNA and remains on oxygen. Hemodynamically stable. -- IV Protonix BID -- Monitor H&H's, transfuse if Hg 7 or less -- Maintain 2 large bore iv line -- Page GI if acute hemodynamic changes or s/sx of active GIB -- Hold home aspirin and pharmacologic VTE prophylaxis  SIRS - POA with tachypnea, leukocytosis, lactic acidosis on arrival without any infectious source identified.  Pulmonary infiltrates on xray are related to recent Covid pneumonia. C. difficile and GI pathogen panels are negative.  Was covered empirically with broad spectrum  antibiotics (vanc, cefepime, and flagyl). --stop antibiotics and monitor clinically --follow up on cultures - negative to date.  Acute kidney injury - POA.  Due to prerenal azotemia in setting of GI bleeding and hypovolemia. Improving with IV hydration.  Hold NSAIDS and ACEI.  Avoid nephrotoxins and hypotension.    Hypokalemia -  replaced.  Monitor BMP and replace as needed.  Hypernatremia - Sodium 148secondary to dehydration, poor PO intake.    --D5w @ 50 cc/hr --Monitor BMP  Diarrhea - due to melena / GI bleeding.  C. difficile and GI pathogen panels are negative. Viral gastroenteritis also possible. Continue supportive care.  Hypertension - chronic, stable. Hold home amlodipine and lisinopril as he has at risk for becoming hypotensive in setting of GI bleed.  History of CVA - Hold aspirin with ongoing GI bleed.  Recent admission for COVID-19 pneumonia - SARS-CoV-2 PCR + 09/06/2020. Was admitted and discharged 09/12/2020, had completed remdesivir, steroids, ceftriaxone/azithromycin for secondary bacterial pneumonia. He was requiring 2 L O2 via [place] on discharge. Currently maintaining O2 saturations on 1.5-2 L [place].  Dementia - Chronic, no major behavioral disturbances.   Appears declining since his wife passed away per son.  --Resume donepezil     DVT prophylaxis: SCDs Start: 09/18/20 2102   Diet:  Diet Orders (From admission, onward)    Start     Ordered   09/20/20 1622  Diet Heart Room service appropriate? Yes; Fluid consistency: Thin  Diet effective now       Question Answer Comment  Room service appropriate? Yes   Fluid consistency: Thin      09/20/20 1622            Code Status: DNR    Subjective 09/20/20    Patient reported feeling okay when seen today.  States that he feels quite weak.  Some mild abdominal discomfort but not overt pain.  Denies nausea vomiting.  States unsure if he has had a recent BM.  No other acute complaints including chest pain, shortness of breath, fevers or chills.   Disposition Plan & Communication   Status is: Inpatient  Remains inpatient appropriate because:Ongoing diagnostic testing needed not appropriate for outpatient work up   Dispo: The patient is from: SNF              Anticipated d/c is to: SNF              Anticipated d/c date is: 2 days               Patient currently is not medically stable to d/c.   Difficult to place patient No        Family Communication: None at bedside, will come to call this afternoon   Consults, Procedures, Significant Events   Consultants:   GI  Procedures:   None  Antimicrobials:  Anti-infectives (From admission, onward)   Start     Dose/Rate Route Frequency Ordered Stop   09/20/20 2100  vancomycin (VANCOREADY) IVPB 1250 mg/250 mL        1,250 mg 166.7 mL/hr over 90 Minutes Intravenous Every 48 hours 09/18/20 2127     09/19/20 1900  ceFEPIme (MAXIPIME) 2 g in sodium chloride 0.9 % 100 mL IVPB        2 g 200 mL/hr over 30 Minutes Intravenous Every 24 hours 09/18/20 2131     09/19/20 0400  metroNIDAZOLE (FLAGYL) IVPB 500 mg        500 mg 100 mL/hr over 60  Minutes Intravenous Every 8 hours 09/18/20 2116     09/18/20 2130  vancomycin (VANCOREADY) IVPB 750 mg/150 mL        750 mg 150 mL/hr over 60 Minutes Intravenous  Once 09/18/20 2121 09/18/20 2309   09/18/20 1900  ceFEPIme (MAXIPIME) 2 g in sodium chloride 0.9 % 100 mL IVPB        2 g 200 mL/hr over 30 Minutes Intravenous  Once 09/18/20 1854 09/18/20 1954   09/18/20 1900  metroNIDAZOLE (FLAGYL) IVPB 500 mg        500 mg 100 mL/hr over 60 Minutes Intravenous  Once 09/18/20 1854 09/18/20 2026   09/18/20 1900  vancomycin (VANCOCIN) IVPB 1000 mg/200 mL premix        1,000 mg 200 mL/hr over 60 Minutes Intravenous  Once 09/18/20 1854 09/18/20 2142        Objective   Vitals:   09/20/20 0829 09/20/20 1227 09/20/20 1550 09/20/20 1609  BP: (!) 144/80 (!) 131/101  (!) 126/58  Pulse: 76 94  75  Resp: 20 (!) 24  20  Temp: (!) 97.5 F (36.4 C) 97.6 F (36.4 C)  97.9 F (36.6 C)  TempSrc: Oral Axillary  Oral  SpO2: 94% 90% 99% 100%  Weight:      Height:        Intake/Output Summary (Last 24 hours) at 09/20/2020 1729 Last data filed at 09/20/2020 0410 Gross per 24 hour  Intake --  Output 100 ml  Net -100 ml   Filed Weights    09/18/20 1647  Weight: 69.9 kg    Physical Exam:  General exam: awake, alert, no acute distress HEENT: Dry mucus membranes, hearing grossly normal  Respiratory system: CTAB, no wheezes, rales or rhonchi, normal respiratory effort. Cardiovascular system: normal S1/S2, RRR, no pedal edema.   Gastrointestinal system: soft, NT, ND, hypoactive bowel sounds. Central nervous system: Oriented to self and place(hospital), no gross focal neurologic deficits, normal speech Extremities: moves all, no cyanosis, normal tone   Labs   Data Reviewed: I have personally reviewed following labs and imaging studies  CBC: Recent Labs  Lab 09/18/20 1655 09/19/20 0441 09/20/20 0520 09/20/20 1219  WBC 26.5* 25.0* 20.9*  --   NEUTROABS 24.9*  --   --   --   HGB 6.0* 7.3* 7.8* 7.7*  HCT 19.9* 23.0* 24.8* 24.4*  MCV 90.0 87.8 88.6  --   PLT 420* 300 306  --    Basic Metabolic Panel: Recent Labs  Lab 09/18/20 1655 09/19/20 0441 09/20/20 0520  NA 148* 146* 148*  K 4.1 3.1* 4.7  CL 115* 118* 122*  CO2 20* 18* 18*  GLUCOSE 163* 155* 85  BUN 76* 67* 62*  CREATININE 1.88* 1.61* 1.74*  CALCIUM 8.7* 7.2* 8.1*  MG  --  2.2  --    GFR: Estimated Creatinine Clearance: 27.3 mL/min (A) (by C-G formula based on SCr of 1.74 mg/dL (H)). Liver Function Tests: Recent Labs  Lab 09/18/20 1655 09/20/20 0520  AST 38 32  ALT 17 16  ALKPHOS 39 42  BILITOT 0.6 0.5  PROT 5.9* 5.3*  ALBUMIN 2.2* 1.9*   No results for input(s): LIPASE, AMYLASE in the last 168 hours. No results for input(s): AMMONIA in the last 168 hours. Coagulation Profile: Recent Labs  Lab 09/18/20 1911 09/19/20 0441  INR 1.2 1.1   Cardiac Enzymes: No results for input(s): CKTOTAL, CKMB, CKMBINDEX, TROPONINI in the last 168 hours. BNP (last 3 results) No results for  input(s): PROBNP in the last 8760 hours. HbA1C: No results for input(s): HGBA1C in the last 72 hours. CBG: No results for input(s): GLUCAP in the last 168  hours. Lipid Profile: No results for input(s): CHOL, HDL, LDLCALC, TRIG, CHOLHDL, LDLDIRECT in the last 72 hours. Thyroid Function Tests: No results for input(s): TSH, T4TOTAL, FREET4, T3FREE, THYROIDAB in the last 72 hours. Anemia Panel: No results for input(s): VITAMINB12, FOLATE, FERRITIN, TIBC, IRON, RETICCTPCT in the last 72 hours. Sepsis Labs: Recent Labs  Lab 09/18/20 1655 09/18/20 1656 09/18/20 1911 09/19/20 0441 09/20/20 0520  PROCALCITON <0.10  --   --  0.17 0.15  LATICACIDVEN  --  2.9* 1.6  --   --     Recent Results (from the past 240 hour(s))  Urine culture     Status: None   Collection Time: 09/18/20  4:50 PM   Specimen: Urine, Random  Result Value Ref Range Status   Specimen Description   Final    URINE, RANDOM Performed at [facility], [address] [place], [place] [address]    Special Requests   Final    NONE Performed at [facility], [address], [place], [place] [address]    Culture   Final    NO GROWTH Performed at [facility], [address] [place], [place] [address]    Report Status 09/20/2020 FINAL  Final  Blood culture (routine x 2)     Status: None (Preliminary result)   Collection Time: 09/18/20  4:56 PM   Specimen: BLOOD  Result Value Ref Range Status   Specimen Description BLOOD LEFT ANTECUBITAL  Final   Special Requests   Final    BOTTLES DRAWN AEROBIC AND ANAEROBIC Blood Culture adequate volume   Culture   Final    NO GROWTH 2 DAYS Performed at [facility], [address], [place], [place] [address]    Report Status PENDING  Incomplete  Blood culture (routine x 2)     Status: None (Preliminary result)   Collection Time: 09/18/20  5:01 PM   Specimen: BLOOD  Result Value Ref Range Status   Specimen Description BLOOD BLOOD RIGHT FOREARM  Final   Special Requests   Final    BOTTLES DRAWN AEROBIC AND ANAEROBIC Blood Culture adequate volume   Culture   Final    NO GROWTH 2 DAYS Performed  at [facility], [address], [place], [place] [address]    Report Status PENDING  Incomplete  Gastrointestinal Panel by PCR , Stool     Status: None   Collection Time: 09/18/20  5:06 PM   Specimen: Stool  Result Value Ref Range Status   Campylobacter species NOT DETECTED NOT DETECTED Final   Plesimonas shigelloides NOT DETECTED NOT DETECTED Final   Salmonella species NOT DETECTED NOT DETECTED Final   Yersinia enterocolitica NOT DETECTED NOT DETECTED Final   Vibrio species NOT DETECTED NOT DETECTED Final   Vibrio cholerae NOT DETECTED NOT DETECTED Final   Enteroaggregative E coli (EAEC) NOT DETECTED NOT DETECTED Final   Enteropathogenic E coli (EPEC) NOT DETECTED NOT DETECTED Final   Enterotoxigenic E coli (ETEC) NOT DETECTED NOT DETECTED Final   Shiga like toxin producing E coli (STEC) NOT DETECTED NOT DETECTED Final   Shigella/Enteroinvasive E coli (EIEC) NOT DETECTED NOT DETECTED Final   Cryptosporidium NOT DETECTED NOT DETECTED Final   Cyclospora cayetanensis NOT DETECTED NOT DETECTED Final   Entamoeba histolytica NOT DETECTED NOT DETECTED Final  Giardia lamblia NOT DETECTED NOT DETECTED Final   Adenovirus F40/41 NOT DETECTED NOT DETECTED Final   Astrovirus NOT DETECTED NOT DETECTED Final   Norovirus GI/GII NOT DETECTED NOT DETECTED Final   Rotavirus A NOT DETECTED NOT DETECTED Final   Sapovirus (I, II, IV, and V) NOT DETECTED NOT DETECTED Final    Comment: Performed at [facility], [address] [place], [place] [address]  C Difficile Quick Screen (NO PCR Reflex)     Status: None   Collection Time: 09/18/20  5:07 PM   Specimen: Stool  Result Value Ref Range Status   C Diff antigen NEGATIVE NEGATIVE Final   C Diff toxin NEGATIVE NEGATIVE Final   C Diff interpretation No C. difficile detected.  Final    Comment: Performed at [facility], [address], [place], [place] [address]      Imaging Studies   CT Angio Chest PE W and/or Wo  Contrast  Result Date: 09/18/2020 CLINICAL DATA:  87 year old male with recent diagnosis of COVID-19. Concern for pulmonary embolism. Diarrhea. EXAM: CT ANGIOGRAPHY CHEST CT ABDOMEN AND PELVIS WITH CONTRAST TECHNIQUE: Multidetector CT imaging of the chest was performed using the standard protocol during bolus administration of intravenous contrast. Multiplanar CT image reconstructions and MIPs were obtained to evaluate the vascular anatomy. Multidetector CT imaging of the abdomen and pelvis was performed using the standard protocol during bolus administration of intravenous contrast. CONTRAST:  60mL OMNIPAQUE IOHEXOL 350 MG/ML SOLN COMPARISON:  Chest CT dated 02/24/2008 and CT abdomen pelvis dated 09/27/2013. FINDINGS: CTA CHEST FINDINGS Cardiovascular: There is no cardiomegaly or pericardial effusion. Three-vessel coronary vascular calcification. Advanced atherosclerotic calcification of the thoracic aorta. No aneurysmal dilatation or dissection. Evaluation of the pulmonary arteries is limited due to respiratory motion artifact. No pulmonary artery embolus identified. Mediastinum/Nodes: There is no hilar or mediastinal adenopathy. There is a large hiatal hernia. The esophagus is grossly unremarkable. No mediastinal fluid collection. Lungs/Pleura: Background of emphysema. Bilateral confluent ground-glass opacities, right greater left most consistent with multifocal pneumonia and in keeping with provided history of COVID-19. Clinical correlation is recommended. There is no pleural effusion pneumothorax. The central airways are patent. Musculoskeletal: Osteopenia with degenerative changes of the spine. Old healed right rib fractures. No acute osseous pathology. Review of the MIP images confirms the above findings. CT ABDOMEN and PELVIS FINDINGS No intra-abdominal free air or free fluid. Hepatobiliary: Multiple hepatic hypodense lesions measure up to 2.3 cm in the left lobe of the liver. The larger lesions  demonstrate fluid attenuation consistent with cysts and smaller lesions are too small to characterize. No intrahepatic biliary dilatation. The gallbladder is unremarkable. Pancreas: Unremarkable. No pancreatic ductal dilatation or surrounding inflammatory changes. Spleen: Normal in size without focal abnormality. Adrenals/Urinary Tract: The adrenal glands unremarkable. There is mild bilateral hydronephroureter. There is a 2.5 cm right renal cyst. There is a 7 mm nonobstructing left renal interpolar calculus. There is mild right perinephric stranding with apparent enhancement of the urothelium of the right renal collecting system and ureter right ureter. Correlation with urinalysis recommended to evaluate for UTI. The urinary bladder is distended. Linear calcific density along the right posterior bladder wall may represent layering stone or focal calcification of the bladder wall. Stomach/Bowel: There is a large hiatal hernia. There is no bowel obstruction. There is loose stool throughout the colon consistent with provided history of diarrheal state. Thickened appearance of the colon may be related to underdistention or represent mild colitis. Clinical correlation is recommended. The appendix  is normal. Vascular/Lymphatic: Advanced aortoiliac atherosclerotic disease. The IVC is unremarkable. No portal venous gas. There is no adenopathy. Reproductive: The prostate and seminal vesicles are grossly unremarkable. Other: None Musculoskeletal: Osteopenia with degenerative changes of the spine and scoliosis. Lower lumbar posterior fusion. No acute osseous pathology. Review of the MIP images confirms the above findings. IMPRESSION: 1. No CT evidence of pulmonary embolism. 2. Multifocal pneumonia in keeping with provided history of COVID-19. Clinical correlation is recommended. 3. Mild bilateral hydronephroureter. Correlation with urinalysis recommended to evaluate for UTI. 4. A 7 mm nonobstructing left renal interpolar  calculus. 5. Diarrheal state. Thickened appearance of the colon may be related to underdistention or represent mild colitis. Clinical correlation is recommended. No bowel obstruction. Normal appendix. 6. Large hiatal hernia. 7. Aortic Atherosclerosis (ICD10-I70.0) and Emphysema (ICD10-J43.9). Electronically Signed   By: Elgie Collard M.D.   On: 09/18/2020 19:12   CT ABDOMEN PELVIS W CONTRAST  Result Date: 09/18/2020 CLINICAL DATA:  87 year old male with recent diagnosis of COVID-19. Concern for pulmonary embolism. Diarrhea. EXAM: CT ANGIOGRAPHY CHEST CT ABDOMEN AND PELVIS WITH CONTRAST TECHNIQUE: Multidetector CT imaging of the chest was performed using the standard protocol during bolus administration of intravenous contrast. Multiplanar CT image reconstructions and MIPs were obtained to evaluate the vascular anatomy. Multidetector CT imaging of the abdomen and pelvis was performed using the standard protocol during bolus administration of intravenous contrast. CONTRAST:  60mL OMNIPAQUE IOHEXOL 350 MG/ML SOLN COMPARISON:  Chest CT dated 02/24/2008 and CT abdomen pelvis dated 09/27/2013. FINDINGS: CTA CHEST FINDINGS Cardiovascular: There is no cardiomegaly or pericardial effusion. Three-vessel coronary vascular calcification. Advanced atherosclerotic calcification of the thoracic aorta. No aneurysmal dilatation or dissection. Evaluation of the pulmonary arteries is limited due to respiratory motion artifact. No pulmonary artery embolus identified. Mediastinum/Nodes: There is no hilar or mediastinal adenopathy. There is a large hiatal hernia. The esophagus is grossly unremarkable. No mediastinal fluid collection. Lungs/Pleura: Background of emphysema. Bilateral confluent ground-glass opacities, right greater left most consistent with multifocal pneumonia and in keeping with provided history of COVID-19. Clinical correlation is recommended. There is no pleural effusion pneumothorax. The central airways are  patent. Musculoskeletal: Osteopenia with degenerative changes of the spine. Old healed right rib fractures. No acute osseous pathology. Review of the MIP images confirms the above findings. CT ABDOMEN and PELVIS FINDINGS No intra-abdominal free air or free fluid. Hepatobiliary: Multiple hepatic hypodense lesions measure up to 2.3 cm in the left lobe of the liver. The larger lesions demonstrate fluid attenuation consistent with cysts and smaller lesions are too small to characterize. No intrahepatic biliary dilatation. The gallbladder is unremarkable. Pancreas: Unremarkable. No pancreatic ductal dilatation or surrounding inflammatory changes. Spleen: Normal in size without focal abnormality. Adrenals/Urinary Tract: The adrenal glands unremarkable. There is mild bilateral hydronephroureter. There is a 2.5 cm right renal cyst. There is a 7 mm nonobstructing left renal interpolar calculus. There is mild right perinephric stranding with apparent enhancement of the urothelium of the right renal collecting system and ureter right ureter. Correlation with urinalysis recommended to evaluate for UTI. The urinary bladder is distended. Linear calcific density along the right posterior bladder wall may represent layering stone or focal calcification of the bladder wall. Stomach/Bowel: There is a large hiatal hernia. There is no bowel obstruction. There is loose stool throughout the colon consistent with provided history of diarrheal state. Thickened appearance of the colon may be related to underdistention or represent mild colitis. Clinical correlation is recommended. The appendix is normal. Vascular/Lymphatic: Advanced aortoiliac  atherosclerotic disease. The IVC is unremarkable. No portal venous gas. There is no adenopathy. Reproductive: The prostate and seminal vesicles are grossly unremarkable. Other: None Musculoskeletal: Osteopenia with degenerative changes of the spine and scoliosis. Lower lumbar posterior fusion. No  acute osseous pathology. Review of the MIP images confirms the above findings. IMPRESSION: 1. No CT evidence of pulmonary embolism. 2. Multifocal pneumonia in keeping with provided history of COVID-19. Clinical correlation is recommended. 3. Mild bilateral hydronephroureter. Correlation with urinalysis recommended to evaluate for UTI. 4. A 7 mm nonobstructing left renal interpolar calculus. 5. Diarrheal state. Thickened appearance of the colon may be related to underdistention or represent mild colitis. Clinical correlation is recommended. No bowel obstruction. Normal appendix. 6. Large hiatal hernia. 7. Aortic Atherosclerosis (ICD10-I70.0) and Emphysema (ICD10-J43.9). Electronically Signed   By: Elgie Collard M.D.   On: 09/18/2020 19:12   DG Chest Portable 1 View  Result Date: 09/18/2020 CLINICAL DATA:  87 year old male with shortness of breath. EXAM: PORTABLE CHEST 1 VIEW COMPARISON:  Chest radiograph dated 09/06/2020. FINDINGS: Interval improvement in bilateral pulmonary opacities with residual streaky densities which may represent residual infiltrate or post inflammatory changes. No consolidative changes. There is no pleural effusion pneumothorax. Stable cardiomegaly. Atherosclerotic calcification of the aorta. Moderate size hiatal hernia. No acute osseous pathology. IMPRESSION: Interval improvement in bilateral pulmonary opacities with residual streaky densities which may represent residual infiltrate or post inflammatory changes. Electronically Signed   By: Elgie Collard M.D.   On: 09/18/2020 17:38     Medications   Scheduled Meds: . acidophilus  2 capsule Oral TID  . [START ON 09/22/2020] pantoprazole  40 mg Intravenous Q12H   Continuous Infusions: . ceFEPime (MAXIPIME) IV Stopped (09/19/20 2230)  . metronidazole 500 mg (09/20/20 1300)  . pantoprozole (PROTONIX) infusion 8 mg/hr (09/20/20 1057)  . vancomycin         LOS: 2 days    Time spent: 30 minutes with greater than 50%  spent at bedside and in coordination of care.    Pennie Banter, DO [facility]  09/20/2020, 5:29 PM    If 7PM-7AM, please contact night-coverage. How to contact the [facility] An Attending or Consulting provider 7A - 7P or covering provider during after hours 7P -7A, for this patient?    1. Check the care team in [facility] and look for a) attending/consulting [facility] provider listed and b) the [facility] team listed 2. Log into [email or web address] and use [facility]'s universal password to access. If you do not have the password, please contact the hospital operator. 3. Locate the [facility] provider you are looking for under [facility] and page to a number that you can be directly reached. 4. If you still have difficulty reaching the provider, please page the [facility] (Director on Call) for the Hospitalists listed on amion for assistance.

## 2020-09-21 DIAGNOSIS — J1282 Pneumonia due to coronavirus disease 2019: Secondary | ICD-10-CM | POA: Diagnosis not present

## 2020-09-21 DIAGNOSIS — D62 Acute posthemorrhagic anemia: Secondary | ICD-10-CM | POA: Diagnosis not present

## 2020-09-21 DIAGNOSIS — E870 Hyperosmolality and hypernatremia: Secondary | ICD-10-CM | POA: Diagnosis not present

## 2020-09-21 DIAGNOSIS — K921 Melena: Secondary | ICD-10-CM | POA: Diagnosis not present

## 2020-09-21 DIAGNOSIS — N179 Acute kidney failure, unspecified: Secondary | ICD-10-CM | POA: Diagnosis not present

## 2020-09-21 DIAGNOSIS — U071 COVID-19: Secondary | ICD-10-CM | POA: Diagnosis not present

## 2020-09-21 LAB — CBC
HCT: 22.5 % — ABNORMAL LOW (ref 39.0–52.0)
Hemoglobin: 7.2 g/dL — ABNORMAL LOW (ref 13.0–17.0)
MCH: 28.1 pg (ref 26.0–34.0)
MCHC: 32 g/dL (ref 30.0–36.0)
MCV: 87.9 fL (ref 80.0–100.0)
Platelets: 313 10*3/uL (ref 150–400)
RBC: 2.56 MIL/uL — ABNORMAL LOW (ref 4.22–5.81)
RDW: 17.5 % — ABNORMAL HIGH (ref 11.5–15.5)
WBC: 16.8 10*3/uL — ABNORMAL HIGH (ref 4.0–10.5)
nRBC: 0 % (ref 0.0–0.2)

## 2020-09-21 LAB — HEMOGLOBIN AND HEMATOCRIT, BLOOD
HCT: 20 % — ABNORMAL LOW (ref 39.0–52.0)
HCT: 22.9 % — ABNORMAL LOW (ref 39.0–52.0)
HCT: 24.3 % — ABNORMAL LOW (ref 39.0–52.0)
Hemoglobin: 6.5 g/dL — ABNORMAL LOW (ref 13.0–17.0)
Hemoglobin: 7.3 g/dL — ABNORMAL LOW (ref 13.0–17.0)
Hemoglobin: 7.4 g/dL — ABNORMAL LOW (ref 13.0–17.0)

## 2020-09-21 LAB — BASIC METABOLIC PANEL
Anion gap: 7 (ref 5–15)
BUN: 59 mg/dL — ABNORMAL HIGH (ref 8–23)
CO2: 18 mmol/L — ABNORMAL LOW (ref 22–32)
Calcium: 8.1 mg/dL — ABNORMAL LOW (ref 8.9–10.3)
Chloride: 123 mmol/L — ABNORMAL HIGH (ref 98–111)
Creatinine, Ser: 1.92 mg/dL — ABNORMAL HIGH (ref 0.61–1.24)
GFR, Estimated: 32 mL/min — ABNORMAL LOW (ref >60)
Glucose, Bld: 115 mg/dL — ABNORMAL HIGH (ref 70–99)
Potassium: 4.3 mmol/L (ref 3.5–5.1)
Sodium: 148 mmol/L — ABNORMAL HIGH (ref 135–145)

## 2020-09-21 LAB — GLUCOSE, CAPILLARY: Glucose-Capillary: 92 mg/dL (ref 70–99)

## 2020-09-21 MED ORDER — DEXTROSE IN LACTATED RINGERS 5 % IV SOLN: INTRAVENOUS | Status: DC

## 2020-09-21 NOTE — Evaluation (Signed)
Occupational Therapy Evaluation Patient Details Name: David [name] MRN: [id number] DOB: [birth year] Today's Date: 09/21/2020    History of Present Illness David [name] is a 89yoF who comes to [facility] from [facility]. Patient recently admitted from 09/06/2020-09/12/2020 for acute hypoxic respiratory failure due to COVID-19 pneumonia and CAP. He was also noted to have orthostatic hypotension while in the hospital. PMH: dementia, history of CVA, hypertension, recent admission for COVID-19 pneumonia.   Clinical Impression   Pt was seen for OT evaluation this date. Prior to hospital admission, pt was at rehab from recent previous hospital admission. Pt alert and oriented to self only, follows simple commands with cues. Pt required MOD A For bed mobility and initial MIN A for static sitting balance EOB. Pt would not uncross his legs despite verbal and tactile cues. Balance improved slightly requiring SBA-CGA for static sitting balance during grooming tasks. Pt required Min A to complete washing face. Currently pt demonstrates impairments as described below (See OT problem list) which functionally limit his ability to perform ADL/self-care tasks. Pt currently requires Min A for grooming and UB ADL, MAX A for LB ADL tasks.  Pt would benefit from skilled OT services to address noted impairments and functional limitations (see below for any additional details) in order to maximize safety and independence while minimizing falls risk and caregiver burden. Upon hospital discharge, recommend STR to maximize pt safety and return to PLOF.     Follow Up Recommendations  SNF    Equipment Recommendations  3 in 1 bedside commode    Recommendations for Other Services       Precautions / Restrictions Precautions Precautions: Fall Restrictions Weight Bearing Restrictions: No Other Position/Activity Restrictions: Orthostatic hypotension      Mobility Bed Mobility Overal bed mobility: Needs Assistance Bed Mobility: Sit to  Supine;Supine to Sit     Supine to sit: Mod assist Sit to supine: Mod assist   General bed mobility comments: cues for sequencing    Transfers                      Balance Overall balance assessment: History of Falls Sitting-balance support: Feet supported;Bilateral upper extremity supported Sitting balance-Leahy Scale: Poor Sitting balance - Comments: Min A for static balance initially, improving to SBA-CGA for static standing Postural control: Posterior lean                                 ADL either performed or assessed with clinical judgement   ADL Overall ADL's : Needs assistance/impaired Eating/Feeding: NPO   Grooming: Sitting;Wash/dry face;Set up;Min guard;Minimal assistance Grooming Details (indicate cue type and reason): able to wash approx 50% of his face then terminates, requiring Min A to complete                               General ADL Comments: anticipate Max A for LB ADL, Max A for ADL transfers     Vision Patient Visual Report: No change from baseline       Perception     Praxis      Pertinent Vitals/Pain Pain Assessment: No/denies pain     Hand Dominance     Extremity/Trunk Assessment Upper Extremity Assessment Upper Extremity Assessment: Generalized weakness   Lower Extremity Assessment Lower Extremity Assessment: Generalized weakness       Communication     Cognition  Arousal/Alertness: Awake/alert Behavior During Therapy: Flat affect Overall Cognitive Status: No family/caregiver present to determine baseline cognitive functioning                                 General Comments: alert and oriented to self, follows simple commands with cues   General Comments       Exercises Other Exercises Other Exercises: bed mobility, static sitting balance, grooming EOB   Shoulder Instructions      Home Living Family/patient expects to be discharged to:: Skilled nursing facility Living  Arrangements: Alone Available Help at Discharge: Family;Available PRN/intermittently Type of Home: House Home Access: Stairs to enter [employer] of Steps: 3 Entrance Stairs-Rails: Right;Left (wide) Home Layout: One level     Bathroom Shower/Tub: Walk-in shower         Home Equipment: Environmental consultant - 2 wheels;Cane - quad;Shower seat   Additional Comments: difficulty obtaining due to confusion, decreased orientation, Home/PLOF from chart review      Prior Functioning/Environment Level of Independence: Needs assistance  Gait / Transfers Assistance Needed: per chart, SPC verse RW for [facility] mobility ADL's / Homemaking Assistance Needed: per chart, Pt reports that his son and DIL help him with IADLs such as cooking/cleaning and help him with bathing for safety. Pt reports otherwise being able to complete his basic ADLs.   Comments: Endorses at least 2-3 falls in last 6 months.        OT Problem List: Decreased strength;Decreased activity tolerance;Impaired balance (sitting and/or standing);Decreased safety awareness;Decreased knowledge of use of DME or AE;Cardiopulmonary status limiting activity;Decreased cognition      OT Treatment/Interventions: Self-care/ADL training;DME and/or AE instruction;Therapeutic activities;Balance training;Therapeutic exercise;Energy conservation;Patient/family education;Cognitive remediation/compensation    OT Goals(Current goals can be found in the care plan section) Acute Rehab OT Goals Patient Stated Goal: pt wants some water (currently NPO) OT Goal Formulation: With patient Time For Goal Achievement: 10/05/20 Potential to Achieve Goals: Good ADL Goals Pt Will Perform Grooming: sitting;with set-up;with supervision Pt Will Perform Lower Body Dressing: sit to/from stand;with mod assist Pt Will Transfer to Toilet: with mod assist;with min assist;bedside commode;stand pivot transfer  OT Frequency: Min 1X/week   Barriers to D/C:             Co-evaluation              AM-PAC OT "6 Clicks" Daily Activity     Outcome Measure Help from another person eating meals?: A Lot (NPO) Help from another person taking care of personal grooming?: A Little Help from another person toileting, which includes using toliet, bedpan, or urinal?: A Lot Help from another person bathing (including washing, rinsing, drying)?: A Lot Help from another person to put on and taking off regular upper body clothing?: A Little Help from another person to put on and taking off regular lower body clothing?: A Lot 6 Click Score: 14   End of Session    Activity Tolerance: Patient tolerated treatment well Patient left: in bed;with call bell/phone within reach;with bed alarm set  OT Visit Diagnosis: Muscle weakness (generalized) (M62.81);Other symptoms and signs involving cognitive function                Time: 5726-2035 OT Time Calculation (min): 23 min Charges:  OT General Charges $OT Visit: 1 Visit OT Evaluation $OT Eval Moderate Complexity: 1 Mod OT Treatments $Self Care/Home Management : 8-22 mins  Richrd Prime, MPH, MS, OTR/L ascom [phone]  09/21/20, 12:12 PM

## 2020-09-21 NOTE — Progress Notes (Signed)
Physical Therapy Treatment Patient Details Name: David [name] MRN: [id number] DOB: 12-16-1926 Today's Date: 09/21/2020    History of Present Illness David [name] is a 89yoF who comes [facility] from [facility]. Patient recently admitted from 09/06/2020-09/12/2020 for acute hypoxic respiratory failure due to COVID-19 pneumonia and CAP. He was also noted to have orthostatic hypotension while in the hospital. PMH: dementia, history of CVA, hypertension, recent admission for COVID-19 pneumonia.    PT Comments    Per RN pt more alert this date, more interactive. Pt in bed awake upon entry. Pt better able to follow commands in general, but struggles with commands on Left hemibody, hence difficulty to say if testing is valid, seems. Pt unable to SLR bilat. Requires totalA for mobility in bed. Pt continues to be hypophonic and mumbly, but is better understood today. Some of patients asides would indicated some disorientation and mild confusion. Will continue to follow.   Follow Up Recommendations  SNF;Supervision for mobility/OOB;Supervision/Assistance - 24 hour     Equipment Recommendations  None recommended by PT    Recommendations for Other Services       Precautions / Restrictions Precautions Precautions: Fall Restrictions Other Position/Activity Restrictions: Orthostatic hypotension    Mobility  Bed Mobility Overal bed mobility: Needs Assistance             General bed mobility comments: Total A for scooting up in bed; ModA for rolling  Transfers                    Ambulation/Gait                 Stairs             Wheelchair Mobility    Modified Rankin (Stroke Patients Only)       Balance                                            Cognition Arousal/Alertness: Awake/alert Behavior During Therapy: WFL for tasks assessed/performed Overall Cognitive Status: No family/caregiver present to determine baseline cognitive functioning                                  General Comments: onyl trouble with commands involves the left hemibody      Exercises Other Exercises Other Exercises: MMT: grips moderately weak, elbow flexion functional bilat; elbow extension 3+/5 bilat; SLR 3-/5; composite hip/knee extension Right 3+/5; Left unable to test, not following commands. Other Exercises: supine chest press x5 bilat; SLR asisted x5 bilat; heel slides x 5 bilat    General Comments        Pertinent Vitals/Pain Pain Assessment: No/denies pain    Home Living                      Prior Function            PT Goals (current goals can now be found in the care plan section) Acute Rehab PT Goals Patient Stated Goal: pt wants some water (currently NPO) PT Goal Formulation: With patient Time For Goal Achievement: 09/22/20 Potential to Achieve Goals: Fair Progress towards PT goals: Not progressing toward goals - comment    Frequency    Min 2X/week      PT Plan Current plan remains appropriate  Co-evaluation              AM-PAC PT "6 Clicks" Mobility   Outcome Measure  Help needed turning from your back to your side while in a flat bed without using bedrails?: Total Help needed moving from lying on your back to sitting on the side of a flat bed without using bedrails?: Total Help needed moving to and from a bed to a chair (including a wheelchair)?: Total Help needed standing up from a chair using your arms (e.g., wheelchair or bedside chair)?: Total Help needed to walk in hospital room?: Total Help needed climbing 3-5 steps with a railing? : Total 6 Click Score: 6    End of Session   Activity Tolerance: Patient tolerated treatment well;No increased pain Patient left: in bed;with call bell/phone within reach;with bed alarm set;with nursing/sitter in room Nurse Communication: Mobility status PT Visit Diagnosis: Unsteadiness on feet (R26.81);Difficulty in walking, not elsewhere classified  (R26.2);Muscle weakness (generalized) (M62.81);History of falling (Z91.81)     Time: 3559-7416 PT Time Calculation (min) (ACUTE ONLY): 8 min  Charges:  $Therapeutic Exercise: 8-22 mins                     4:39 PM, 09/21/20 Rosamaria Lints, PT, DPT Physical Therapist - [facility]  [phone] (ASCOM)    [name] 09/21/2020, 4:37 PM

## 2020-09-21 NOTE — Progress Notes (Signed)
PROGRESS NOTE    David [name]   ALP:[id number]  DOB: Jun 26, 1927  PCP: [facility]    DOA: 09/18/2020 LOS: 3   Brief Narrative   David [name] is a 87 y.o. male with medical history significant for dementia, history of CVA, hypertension, recent admission for COVID-19 pneumonia who presents to the ED from SNF for evaluation of shortness of breath and diarrhea.  History is limited from patient due to underlying dementia and is otherwise supplemented by EDP, chart review, and patient's son by phone.   Patient recently admitted from 09/06/2020-09/12/2020 for acute hypoxic respiratory failure due to COVID-19 pneumonia and CAP.  Patient was treated with IV remdesivir, steroids, IV ceftriaxone/azithromycin.  He was discharged on 2 L of home O2 via [place] to SNF.  He was also noted to have orthostatic hypotension while in the hospital.   Patient returns to the ED from SNF due to reported shortness of breath and loose stools. Per ED triage notes patient was brought in via EMS on 100% NRB but switched to 3 L supplemental O2 via [place] on arrival. Patient complains of diarrhea and abdominal pain but is not able to provide further history.     Labs notable for BUN 76, creatinine 1.88 (1.08 on 09/11/2020), hemoglobin 6.0 (12.0 on 09/10/2020), WBC 26.5, hs-troponin 43, lactic acid 2.9 > 1.6. C. difficile and GI pathogen panels both negative.   Chest x-ray with interval improvement in bilateral pulmonary opacities from prior.   CTA chest/abdomen/pelvis obtained.  Negative for evidence of PE.  Bilateral groundglass opacities seen consistent with recent COVID-19 pneumonia.  Mild bilateral hydronephroureter and a 7 mm nonobstructing left renal interpolar calculus seen.  Thickened appearance of the colon seen without evidence of bowel obstruction.  Large hiatal hernia also noted.   Per EDP, melena present on rectal exam which is Hemoccult positive.  Patient was started on broad-spectrum empiric antibiotics  with IV vancomycin, cefepime and Flagyl.  Patient was given IV Protonix bolus and started on continuous infusion.  Patient received 1 L normal saline.  Order for transfuse 2 units PRBCs placed. EDP discussed with on-call GI who is aware of patient. The hospitalist service was consulted to admit for further evaluation and management.     Assessment & Plan   Principal Problem:   Acute blood loss anemia Active Problems:   Pneumonia due to COVID-19 virus   Dementia (HCC)   Essential hypertension   History of CVA (cerebrovascular accident)   Hypernatremia   Systemic inflammatory response syndrome (SIRS) associated with organ dysfunction (HCC)   AKI (acute kidney injury) (HCC)   Symptomatic acute blood loss anemia due to GI bleed - POA with Hbg 6.0, down from 12.0 at recent discharge.  With melena and hemoccult positive stool in ED.   Transfused 2 units PRBCs. GI consulted, input appreciated. Family spoke with GI and decline endoscopy (1/27). Hemodynamically stable.   Hbg stable in low 7's. -- IV Protonix BID -- Monitor H&H's, transfuse if Hg 7 or less -- Maintain 2 large bore iv line -- Page GI if acute hemodynamic changes or s/sx of active GIB -- Hold home aspirin and pharmacologic VTE prophylaxis  SIRS - POA with tachypnea, leukocytosis, lactic acidosis on arrival without any infectious source identified.  Pulmonary infiltrates on xray are related to recent Covid pneumonia. C. difficile and GI pathogen panels are negative.  Was covered empirically with broad spectrum antibiotics (vanc, cefepime, and flagyl). --stop antibiotics and monitor clinically --follow up  on cultures - negative to date.  Acute kidney injury - POA.  Due to prerenal azotemia in setting of GI bleeding and hypovolemia. Improving with IV hydration.  Hold NSAIDS and ACEI.  Avoid nephrotoxins and hypotension.   --Follow daily BMP's --on IV fluids, reassess need daily & Monitor volume status  Hypokalemia -  replaced.  Monitor BMP and replace as needed.  Hypernatremia - Sodium 148secondary to dehydration, poor PO intake.    --D5w-LR @ 75 cc/hr --Monitor BMP  Diarrhea - due to melena / GI bleeding.  Resolved. C. difficile and GI pathogen panels are negative. Viral gastroenteritis also possible. Continue supportive care.  Hypertension - chronic, stable. Hold home amlodipine and lisinopril as he has at risk for becoming hypotensive in setting of GI bleed.  History of CVA - Hold aspirin with ongoing GI bleed.  Resume once Hbg stable and improving.  Recent admission for COVID-19 pneumonia - SARS-CoV-2 PCR + 09/06/2020. Was admitted and discharged 09/12/2020, had completed remdesivir, steroids, ceftriaxone/azithromycin for secondary bacterial pneumonia. He was requiring 2 L O2 via [place] on discharge. Currently maintaining O2 saturations on 1.5-2 L [place].  Dementia - Chronic, no major behavioral disturbances.   Appears declining since his wife passed away per son.  --Resume donepezil     DVT prophylaxis: SCDs Start: 09/18/20 2102   Diet:  Diet Orders (From admission, onward)    Start     Ordered   09/21/20 1059  DIET DYS 2 Room service appropriate? Yes; Fluid consistency: Thin  Diet effective now       Question Answer Comment  Room service appropriate? Yes   Fluid consistency: Thin      09/21/20 1059            Code Status: DNR    Subjective 09/21/20    Patient seen today and stated "I just don't know what to say" when asked how he's feeling today.  He was not able to further clarify what he meant.  Overall says feeling okay.  Unsure if he's had a BM past couple days.  No reports from nursing about any more melena.  Disposition Plan & Communication   Status is: Inpatient  Remains inpatient appropriate because:Ongoing diagnostic testing needed not appropriate for outpatient work up.  Borderline hemoglobin and may require additional transfusions, requires frequent labs for  monitoring.     Dispo: The patient is from: SNF              Anticipated d/c is to: SNF              Anticipated d/c date is: 2 days              Patient currently is not medically stable to d/c.   Difficult to place patient No   Family Communication: None at bedside, will come to call this afternoon   Consults, Procedures, Significant Events   Consultants:   GI  Procedures:   None  Antimicrobials:  Anti-infectives (From admission, onward)   Start     Dose/Rate Route Frequency Ordered Stop   09/20/20 2100  vancomycin (VANCOREADY) IVPB 1250 mg/250 mL  Status:  Discontinued        1,250 mg 166.7 mL/hr over 90 Minutes Intravenous Every 48 hours 09/18/20 2127 09/20/20 1736   09/19/20 1900  ceFEPIme (MAXIPIME) 2 g in sodium chloride 0.9 % 100 mL IVPB  Status:  Discontinued        2 g 200 mL/hr over 30 Minutes Intravenous Every  24 hours 09/18/20 2131 09/20/20 1736   09/19/20 0400  metroNIDAZOLE (FLAGYL) IVPB 500 mg  Status:  Discontinued        500 mg 100 mL/hr over 60 Minutes Intravenous Every 8 hours 09/18/20 2116 09/20/20 1736   09/18/20 2130  vancomycin (VANCOREADY) IVPB 750 mg/150 mL        750 mg 150 mL/hr over 60 Minutes Intravenous  Once 09/18/20 2121 09/18/20 2309   09/18/20 1900  ceFEPIme (MAXIPIME) 2 g in sodium chloride 0.9 % 100 mL IVPB        2 g 200 mL/hr over 30 Minutes Intravenous  Once 09/18/20 1854 09/18/20 1954   09/18/20 1900  metroNIDAZOLE (FLAGYL) IVPB 500 mg        500 mg 100 mL/hr over 60 Minutes Intravenous  Once 09/18/20 1854 09/18/20 2026   09/18/20 1900  vancomycin (VANCOCIN) IVPB 1000 mg/200 mL premix        1,000 mg 200 mL/hr over 60 Minutes Intravenous  Once 09/18/20 1854 09/18/20 2142        Objective   Vitals:   09/21/20 0437 09/21/20 0731 09/21/20 1141 09/21/20 1714  BP: (!) 125/49 (!) 133/41 (!) 134/51 (!) 126/47  Pulse: 84 89 79 70  Resp: Temp: 98.3 F (36.8 C) 98.4 F (36.9 C) 97.6 F (36.4 C) 97.7 F (36.5 C)   TempSrc:   Oral Oral  SpO2: 100% 94% 100% 91%  Weight:      Height:        Intake/Output Summary (Last 24 hours) at 09/21/2020 1726 Last data filed at 09/21/2020 0453 Gross per 24 hour  Intake 550 ml  Output 950 ml  Net -400 ml   Filed Weights   09/18/20 1647  Weight: 69.9 kg    Physical Exam:  General exam: awake, alert, no acute distress HEENT: Dry mucus membranes, hearing grossly normal  Respiratory system: symmetric chest rise, normal respiratory effort, on room air. Cardiovascular system: normal S1/S2, RRR, no pedal edema.   Gastrointestinal system: soft, NT, ND, hypoactive bowel sounds. Central nervous system: Oriented to self and place(hospital), no gross focal neurologic deficits, normal speech Extremities: no cyanosis, normal tone   Labs   Data Reviewed: I have personally reviewed following labs and imaging studies  CBC: Recent Labs  Lab 09/18/20 1655 09/19/20 0441 09/20/20 0520 09/20/20 1219 09/20/20 1750 09/21/20 0000 09/21/20 0545 09/21/20 1215  WBC 26.5* 25.0* 20.9*  --   --   --  16.8*  --   NEUTROABS 24.9*  --   --   --   --   --   --   --   HGB 6.0* 7.3* 7.8* 7.7* 7.4* 7.3* 7.2* 7.4*  HCT 19.9* 23.0* 24.8* 24.4* 23.3* 22.9* 22.5* 24.3*  MCV 90.0 87.8 88.6  --   --   --  87.9  --   PLT 420* 300 306  --   --   --  313  --    Basic Metabolic Panel: Recent Labs  Lab 09/18/20 1655 09/19/20 0441 09/20/20 0520 09/21/20 0545  NA 148* 146* 148* 148*  K 4.1 3.1* 4.7 4.3  CL 115* 118* 122* 123*  CO2 20* 18* 18* 18*  GLUCOSE 163* 155* 85 115*  BUN 76* 67* 62* 59*  CREATININE 1.88* 1.61* 1.74* 1.92*  CALCIUM 8.7* 7.2* 8.1* 8.1*  MG  --  2.2  --   --    GFR: Estimated Creatinine Clearance: 24.8 mL/min (A) (by  C-G formula based on SCr of 1.92 mg/dL (H)). Liver Function Tests: Recent Labs  Lab 09/18/20 1655 09/20/20 0520  AST 38 32  ALT 17 16  ALKPHOS 39 42  BILITOT 0.6 0.5  PROT 5.9* 5.3*  ALBUMIN 2.2* 1.9*   No results for input(s):  LIPASE, AMYLASE in the last 168 hours. No results for input(s): AMMONIA in the last 168 hours. Coagulation Profile: Recent Labs  Lab 09/18/20 1911 09/19/20 0441  INR 1.2 1.1   Cardiac Enzymes: No results for input(s): CKTOTAL, CKMB, CKMBINDEX, TROPONINI in the last 168 hours. BNP (last 3 results) No results for input(s): PROBNP in the last 8760 hours. HbA1C: No results for input(s): HGBA1C in the last 72 hours. CBG: Recent Labs  Lab 09/21/20 0730  GLUCAP 92   Lipid Profile: No results for input(s): CHOL, HDL, LDLCALC, TRIG, CHOLHDL, LDLDIRECT in the last 72 hours. Thyroid Function Tests: No results for input(s): TSH, T4TOTAL, FREET4, T3FREE, THYROIDAB in the last 72 hours. Anemia Panel: No results for input(s): VITAMINB12, FOLATE, FERRITIN, TIBC, IRON, RETICCTPCT in the last 72 hours. Sepsis Labs: Recent Labs  Lab 09/18/20 1655 09/18/20 1656 09/18/20 1911 09/19/20 0441 09/20/20 0520  PROCALCITON <0.10  --   --  0.17 0.15  LATICACIDVEN  --  2.9* 1.6  --   --     Recent Results (from the past 240 hour(s))  Urine culture     Status: None   Collection Time: 09/18/20  4:50 PM   Specimen: Urine, Random  Result Value Ref Range Status   Specimen Description   Final    URINE, RANDOM Performed at [facility], [address] [place], [place] [address]    Special Requests   Final    NONE Performed at [facility], [address] [place], [place] [address]    Culture   Final    NO GROWTH Performed at [facility], [address] [place], [place] [address]    Report Status 09/20/2020 FINAL  Final  Blood culture (routine x 2)     Status: None (Preliminary result)   Collection Time: 09/18/20  4:56 PM   Specimen: BLOOD  Result Value Ref Range Status   Specimen Description BLOOD LEFT ANTECUBITAL  Final   Special Requests   Final    BOTTLES DRAWN AEROBIC AND ANAEROBIC Blood Culture adequate volume   Culture   Final    NO GROWTH 3  DAYS Performed at [facility], [address], [place], [place] [address]    Report Status PENDING  Incomplete  Blood culture (routine x 2)     Status: None (Preliminary result)   Collection Time: 09/18/20  5:01 PM   Specimen: BLOOD  Result Value Ref Range Status   Specimen Description BLOOD BLOOD RIGHT FOREARM  Final   Special Requests   Final    BOTTLES DRAWN AEROBIC AND ANAEROBIC Blood Culture adequate volume   Culture   Final    NO GROWTH 3 DAYS Performed at [facility], [address] [place], [place] [address]    Report Status PENDING  Incomplete  Gastrointestinal Panel by PCR , Stool     Status: None   Collection Time: 09/18/20  5:06 PM   Specimen: Stool  Result Value Ref Range Status   Campylobacter species NOT DETECTED NOT DETECTED Final   Plesimonas shigelloides NOT DETECTED NOT DETECTED Final   Salmonella species NOT DETECTED NOT DETECTED Final   Yersinia enterocolitica NOT DETECTED NOT  DETECTED Final   Vibrio species NOT DETECTED NOT DETECTED Final   Vibrio cholerae NOT DETECTED NOT DETECTED Final   Enteroaggregative E coli (EAEC) NOT DETECTED NOT DETECTED Final   Enteropathogenic E coli (EPEC) NOT DETECTED NOT DETECTED Final   Enterotoxigenic E coli (ETEC) NOT DETECTED NOT DETECTED Final   Shiga like toxin producing E coli (STEC) NOT DETECTED NOT DETECTED Final   Shigella/Enteroinvasive E coli (EIEC) NOT DETECTED NOT DETECTED Final   Cryptosporidium NOT DETECTED NOT DETECTED Final   Cyclospora cayetanensis NOT DETECTED NOT DETECTED Final   Entamoeba histolytica NOT DETECTED NOT DETECTED Final   Giardia lamblia NOT DETECTED NOT DETECTED Final   Adenovirus F40/41 NOT DETECTED NOT DETECTED Final   Astrovirus NOT DETECTED NOT DETECTED Final   Norovirus GI/GII NOT DETECTED NOT DETECTED Final   Rotavirus A NOT DETECTED NOT DETECTED Final   Sapovirus (I, II, IV, and V) NOT DETECTED NOT DETECTED Final    Comment: Performed at [facility],  [address] [place], [place] [address]  C Difficile Quick Screen (NO PCR Reflex)     Status: None   Collection Time: 09/18/20  5:07 PM   Specimen: Stool  Result Value Ref Range Status   C Diff antigen NEGATIVE NEGATIVE Final   C Diff toxin NEGATIVE NEGATIVE Final   C Diff interpretation No C. difficile detected.  Final    Comment: Performed at [facility], [address] [place], [place] [address]      Imaging Studies   No results found.   Medications   Scheduled Meds: . acidophilus  2 capsule Oral TID  . donepezil  5 mg Oral QHS  . [START ON 09/22/2020] pantoprazole  40 mg Intravenous Q12H   Continuous Infusions: . dextrose 5% lactated ringers 75 mL/hr at 09/21/20 1011  . pantoprozole (PROTONIX) infusion 8 mg/hr (09/21/20 1209)       LOS: 3 days    Time spent: 25 minutes with greater than 50% spent at bedside and in coordination of care.    Pennie Banter, DO [facility]  09/21/2020, 5:26 PM    If 7PM-7AM, please contact night-coverage. How to contact the [facility] Attending or Consulting provider 7A - 7P or covering provider during after hours 7P -7A, for this patient?    1. Check the care team in [facility] and look for a) attending/consulting TRH provider listed and b) the [facility] team listed 2. Log into [email or web address] and use [place]'s universal password to access. If you do not have the password, please contact the hospital operator. 3. Locate the [facility] provider you are looking for under [facility] and page to a number that you can be directly reached. 4. If you still have difficulty reaching the provider, please page the [facility] (Director on Call) for the Hospitalists listed on amion for assistance.

## 2020-09-21 NOTE — Progress Notes (Signed)
Melodie Bouillon, MD [address], [place], [place], [address] [address], [place], [place], [address] Phone: [phone]  Fax: [phone]   Subjective: Patient remains pleasantly confused given his underlying dementia.  No acute events overnight.  Documented bowel movements under flowsheets have been reported to be brown in color over the last 48 hours  Objective: Exam: Vital signs in last 24 hours: Vitals:   09/20/20 2040 09/21/20 0017 09/21/20 0437 09/21/20 0731  BP: (!) 145/44 (!) 135/48 (!) 125/49 (!) 133/41  Pulse: 82 81 84 89  Resp: 18 20 20 16   Temp: 98.1 F (36.7 C) 97.8 F (36.6 C) 98.3 F (36.8 C) 98.4 F (36.9 C)  TempSrc:      SpO2: 100% 98% 100% 94%  Weight:      Height:       Weight change:   Intake/Output Summary (Last 24 hours) at 09/21/2020 1047 Last data filed at 09/21/2020 0453 Gross per 24 hour  Intake 550 ml  Output 950 ml  Net -400 ml    General: No acute distress, Abd: Soft, NT/ND, No HSM Skin: Warm, no rashes Neck: Supple, Trachea midline   Lab Results: Lab Results  Component Value Date   WBC 16.8 (H) 09/21/2020   HGB 7.2 (L) 09/21/2020   HCT 22.5 (L) 09/21/2020   MCV 87.9 09/21/2020   PLT 313 09/21/2020   Micro Results: Recent Results (from the past 240 hour(s))  Urine culture     Status: None   Collection Time: 09/18/20  4:50 PM   Specimen: Urine, Random  Result Value Ref Range Status   Specimen Description   Final    URINE, RANDOM Performed at [facility], [address] [place], [place]    Special Requests   Final    NONE Performed at [facility], [address] [place], [place]    Culture   Final    NO GROWTH Performed at [facility], [address] [place], [place]    Report Status 09/20/2020 FINAL  Final  Blood culture (routine x 2)     Status: None (Preliminary result)   Collection Time: 09/18/20  4:56 PM   Specimen:  BLOOD  Result Value Ref Range Status   Specimen Description BLOOD LEFT ANTECUBITAL  Final   Special Requests   Final    BOTTLES DRAWN AEROBIC AND ANAEROBIC Blood Culture adequate volume   Culture   Final    NO GROWTH 3 DAYS Performed at [facility], [address] [place], [place]    Report Status PENDING  Incomplete  Blood culture (routine x 2)     Status: None (Preliminary result)   Collection Time: 09/18/20  5:01 PM   Specimen: BLOOD  Result Value Ref Range Status   Specimen Description BLOOD BLOOD RIGHT FOREARM  Final   Special Requests   Final    BOTTLES DRAWN AEROBIC AND ANAEROBIC Blood Culture adequate volume   Culture   Final    NO GROWTH 3 DAYS Performed at [facility], [address], [place], [place]    Report Status PENDING  Incomplete  Gastrointestinal Panel by PCR , Stool     Status: None   Collection Time: 09/18/20  5:06 PM   Specimen: Stool  Result Value Ref Range Status   Campylobacter species NOT DETECTED NOT DETECTED Final   Plesimonas shigelloides NOT DETECTED NOT DETECTED Final   Salmonella  species NOT DETECTED NOT DETECTED Final   Yersinia enterocolitica NOT DETECTED NOT DETECTED Final   Vibrio species NOT DETECTED NOT DETECTED Final   Vibrio cholerae NOT DETECTED NOT DETECTED Final   Enteroaggregative E coli (EAEC) NOT DETECTED NOT DETECTED Final   Enteropathogenic E coli (EPEC) NOT DETECTED NOT DETECTED Final   Enterotoxigenic E coli (ETEC) NOT DETECTED NOT DETECTED Final   Shiga like toxin producing E coli (STEC) NOT DETECTED NOT DETECTED Final   Shigella/Enteroinvasive E coli (EIEC) NOT DETECTED NOT DETECTED Final   Cryptosporidium NOT DETECTED NOT DETECTED Final   Cyclospora cayetanensis NOT DETECTED NOT DETECTED Final   Entamoeba histolytica NOT DETECTED NOT DETECTED Final   Giardia lamblia NOT DETECTED NOT DETECTED Final   Adenovirus F40/41 NOT DETECTED NOT DETECTED Final   Astrovirus NOT DETECTED NOT  DETECTED Final   Norovirus GI/GII NOT DETECTED NOT DETECTED Final   Rotavirus A NOT DETECTED NOT DETECTED Final   Sapovirus (I, II, IV, and V) NOT DETECTED NOT DETECTED Final    Comment: Performed at [facility], [address] [place], [place] [address]  C Difficile Quick Screen (NO PCR Reflex)     Status: None   Collection Time: 09/18/20  5:07 PM   Specimen: Stool  Result Value Ref Range Status   C Diff antigen NEGATIVE NEGATIVE Final   C Diff toxin NEGATIVE NEGATIVE Final   C Diff interpretation No C. difficile detected.  Final    Comment: Performed at [facility], [address] [place], [place] [address]   Studies/Results: No results found. Medications:  Scheduled Meds: . acidophilus  2 capsule Oral TID  . donepezil  5 mg Oral QHS  . [START ON 09/22/2020] pantoprazole  40 mg Intravenous Q12H   Continuous Infusions: . dextrose 5% lactated ringers 75 mL/hr at 09/21/20 1011  . pantoprozole (PROTONIX) infusion 8 mg/hr (09/20/20 2207)   PRN Meds:.ondansetron **OR** ondansetron (ZOFRAN) IV   Assessment: Principal Problem:   Acute blood loss anemia Active Problems:   Pneumonia due to COVID-19 virus   Dementia (HCC)   Essential hypertension   History of CVA (cerebrovascular accident)   Hypernatremia   Systemic inflammatory response syndrome (SIRS) associated with organ dysfunction (HCC)   AKI (acute kidney injury) (HCC)    Plan: I called the patient's family again today and discussed his clinical status at this time.  They asked what his hemoglobin has been and since it has been holding steady above 7 over the last few days, with bowel movements not being black anymore, they state they would like to hold off on any procedures at this time.  They specifically state that patient himself has expressed to them in the past that he would like to avoid invasive procedures and let nature take its course  They do also state that if he has an underlying GI  malignancy, they would not want to pursue treatment and understand the risk of missing malignancy or delaying diagnosis of the same without procedures at this time  However, patient's family is willing to reassess if symptoms change and he has any further signs of active bleeding or drop in hemoglobin  PPI IV twice daily  Continue serial CBCs and transfuse PRN Avoid NSAIDs Maintain 2 large-bore IV lines Please page GI with any acute hemodynamic changes, or signs of active GI bleeding    LOS: 3 days   Melodie Bouillon, MD 09/21/2020, 10:47 AM

## 2020-09-22 DIAGNOSIS — Z7189 Other specified counseling: Secondary | ICD-10-CM | POA: Diagnosis not present

## 2020-09-22 DIAGNOSIS — N179 Acute kidney failure, unspecified: Secondary | ICD-10-CM | POA: Diagnosis not present

## 2020-09-22 DIAGNOSIS — J1282 Pneumonia due to coronavirus disease 2019: Secondary | ICD-10-CM | POA: Diagnosis not present

## 2020-09-22 DIAGNOSIS — F0390 Unspecified dementia without behavioral disturbance: Secondary | ICD-10-CM

## 2020-09-22 DIAGNOSIS — I10 Essential (primary) hypertension: Secondary | ICD-10-CM

## 2020-09-22 DIAGNOSIS — K921 Melena: Secondary | ICD-10-CM | POA: Diagnosis not present

## 2020-09-22 DIAGNOSIS — Z66 Do not resuscitate: Secondary | ICD-10-CM | POA: Diagnosis not present

## 2020-09-22 DIAGNOSIS — U071 COVID-19: Secondary | ICD-10-CM | POA: Diagnosis not present

## 2020-09-22 DIAGNOSIS — Z515 Encounter for palliative care: Secondary | ICD-10-CM

## 2020-09-22 DIAGNOSIS — K922 Gastrointestinal hemorrhage, unspecified: Secondary | ICD-10-CM

## 2020-09-22 DIAGNOSIS — D62 Acute posthemorrhagic anemia: Secondary | ICD-10-CM | POA: Diagnosis not present

## 2020-09-22 LAB — CBC
HCT: 19.3 % — ABNORMAL LOW (ref 39.0–52.0)
Hemoglobin: 6.3 g/dL — ABNORMAL LOW (ref 13.0–17.0)
MCH: 28.9 pg (ref 26.0–34.0)
MCHC: 32.6 g/dL (ref 30.0–36.0)
MCV: 88.5 fL (ref 80.0–100.0)
Platelets: 257 10*3/uL (ref 150–400)
RBC: 2.18 MIL/uL — ABNORMAL LOW (ref 4.22–5.81)
RDW: 17.9 % — ABNORMAL HIGH (ref 11.5–15.5)
WBC: 13.9 10*3/uL — ABNORMAL HIGH (ref 4.0–10.5)
nRBC: 0 % (ref 0.0–0.2)

## 2020-09-22 LAB — BASIC METABOLIC PANEL
Anion gap: 8 (ref 5–15)
BUN: 54 mg/dL — ABNORMAL HIGH (ref 8–23)
CO2: 19 mmol/L — ABNORMAL LOW (ref 22–32)
Calcium: 8.2 mg/dL — ABNORMAL LOW (ref 8.9–10.3)
Chloride: 123 mmol/L — ABNORMAL HIGH (ref 98–111)
Creatinine, Ser: 1.74 mg/dL — ABNORMAL HIGH (ref 0.61–1.24)
GFR, Estimated: 37 mL/min — ABNORMAL LOW (ref >60)
Glucose, Bld: 157 mg/dL — ABNORMAL HIGH (ref 70–99)
Potassium: 3.8 mmol/L (ref 3.5–5.1)
Sodium: 150 mmol/L — ABNORMAL HIGH (ref 135–145)

## 2020-09-22 LAB — HEMOGLOBIN AND HEMATOCRIT, BLOOD
HCT: 19.8 % — ABNORMAL LOW (ref 39.0–52.0)
Hemoglobin: 6.2 g/dL — ABNORMAL LOW (ref 13.0–17.0)

## 2020-09-22 LAB — PREPARE RBC (CROSSMATCH)

## 2020-09-22 MED ORDER — DEXTROSE 5 % IV SOLN
INTRAVENOUS | Status: DC
  Filled 2020-09-22: qty 1000

## 2020-09-22 MED ORDER — SODIUM CHLORIDE 0.9% IV SOLUTION: Freq: Once | INTRAVENOUS | Status: AC

## 2020-09-22 NOTE — Progress Notes (Signed)
Occupational Therapy Treatment Patient Details Name: David [name] MRN: [id number] DOB: 04/23/1927 Today's Date: 09/22/2020    History of present illness David [name] is a 11yoF who comes to [facility] from [place]. Patient recently admitted from 09/06/2020-09/12/2020 for acute hypoxic respiratory failure due to COVID-19 pneumonia and CAP. He was also noted to have orthostatic hypotension while in the hospital. PMH: dementia, history of CVA, hypertension, recent admission for COVID-19 pneumonia.   OT comments  Pt seen for OT tx this date to f/u re: safety with ADLs/ADL mobility. OT engages pt in grooming with CGA/SETUP bed level to brush teeth and facilitates pt participation in sup<>sit transition with MAX A with increased time and multimodal cues as pt demos decreased ability to sequence/motor plan with L notably more difficult for pt that R side. Will continue to follow acutely to progress with mobility and self care. Continue to anticiapte pt will require STR upon d/c. Notified RN that pt's IV appeared to be weeping. Pt left with all needs met and in reach, bed alarm on.    Follow Up Recommendations  SNF    Equipment Recommendations  3 in 1 bedside commode    Recommendations for Other Services      Precautions / Restrictions Precautions Precautions: Fall Restrictions Weight Bearing Restrictions: No Other Position/Activity Restrictions: Orthostatic hypotension       Mobility Bed Mobility Overal bed mobility: Needs Assistance Bed Mobility: Sit to Supine;Supine to Sit;Rolling Rolling: Min assist;Mod assist   Supine to sit: Max assist Sit to supine: Max assist   General bed mobility comments: pt requires increased time and demos difficulty with motor planning/sequencing to perform sup to sit transition.  Transfers                 General transfer comment: deferred    Balance Overall balance assessment: History of Falls Sitting-balance support: Feet supported;Bilateral upper  extremity supported Sitting balance-Leahy Scale: Poor Sitting balance - Comments: MIN A to sustain static initially, progresses to CGA/SBA                                   ADL either performed or assessed with clinical judgement   ADL Overall ADL's : Needs assistance/impaired     Grooming: Oral care;Set up;Min guard;Bed level;Cueing for sequencing                                       Vision Patient Visual Report: No change from baseline     Perception     Praxis      Cognition Arousal/Alertness: Awake/alert Behavior During Therapy: WFL for tasks assessed/performed Overall Cognitive Status: No family/caregiver present to determine baseline cognitive functioning                                 General Comments: Pt with difficulty with motor planning/sequencing mobility, but does appear to understand commands.        Exercises Other Exercises Other Exercises: OT engages pt in grooming with CGA/SETUP bed level to brush teeth and facilitates pt participation in sup<>sit transition with MAX A with increased time and multimodal cues as pt demos decreased ability to sequence/motor plan with L notably more difficult for pt that R side.   Shoulder Instructions  General Comments      Pertinent Vitals/ Pain       Pain Assessment: No/denies pain  Home Living                                          Prior Functioning/Environment              Frequency  Min 1X/week        Progress Toward Goals  OT Goals(current goals can now be found in the care plan section)  Progress towards OT goals: Progressing toward goals  Acute Rehab OT Goals Patient Stated Goal: to feel better OT Goal Formulation: With patient Time For Goal Achievement: 10/05/20 Potential to Achieve Goals: Good  Plan Discharge plan remains appropriate;Frequency remains appropriate    Co-evaluation                 AM-PAC OT  "6 Clicks" Daily Activity     Outcome Measure   Help from another person eating meals?: A Lot Help from another person taking care of personal grooming?: A Little Help from another person toileting, which includes using toliet, bedpan, or urinal?: A Lot Help from another person bathing (including washing, rinsing, drying)?: A Lot Help from another person to put on and taking off regular upper body clothing?: A Little Help from another person to put on and taking off regular lower body clothing?: A Lot 6 Click Score: 14    End of Session    OT Visit Diagnosis: Muscle weakness (generalized) (M62.81);Other symptoms and signs involving cognitive function   Activity Tolerance Patient tolerated treatment well   Patient Left in bed;with call bell/phone within reach;with bed alarm set   Nurse Communication Mobility status;Other (comment) (notified RN that pt's IV in R forearm appears to be weaping, she promptly disconnects and checks.)        Time: 3754-2370 OT Time Calculation (min): 39 min  Charges: OT General Charges $OT Visit: 1 Visit OT Treatments $Self Care/Home Management : 23-37 mins $Therapeutic Activity: 8-22 mins  Gerrianne Scale, [place], OTR/L ascom [phone] 09/22/20, 5:40 PM

## 2020-09-22 NOTE — Progress Notes (Signed)
PROGRESS NOTE    David [name]   HKV:[id number]  DOB: 05-Apr-1927  PCP: [facility]    DOA: 09/18/2020 LOS: 4   Brief Narrative   David [name] is a 87 y.o. male with medical history significant for dementia, history of CVA, hypertension, recent admission for COVID-19 pneumonia who presents to the ED from SNF for evaluation of shortness of breath and diarrhea.  History is limited from [name] due to underlying dementia and is otherwise supplemented by EDP, chart review, and [name]'s son by phone.   [name] recently admitted from 09/06/2020-09/12/2020 for acute hypoxic respiratory failure due to COVID-19 pneumonia and CAP.  [name] was treated with IV remdesivir, steroids, IV ceftriaxone/azithromycin.  He was discharged on 2 L of home O2 via [place] to SNF.  He was also noted to have orthostatic hypotension while in the hospital.   [name] returns to the ED from SNF due to reported shortness of breath and loose stools. Per ED triage notes [name] was brought in via EMS on 100% NRB but switched to 3 L supplemental O2 via [place] on arrival. [name] complains of diarrhea and abdominal pain but is not able to provide further history.     Labs notable for BUN 76, creatinine 1.88 (1.08 on 09/11/2020), hemoglobin 6.0 (12.0 on 09/10/2020), WBC 26.5, hs-troponin 43, lactic acid 2.9 > 1.6. C. difficile and GI pathogen panels both negative.   Chest x-ray with interval improvement in bilateral pulmonary opacities from prior.   CTA chest/abdomen/pelvis obtained.  Negative for evidence of PE.  Bilateral groundglass opacities seen consistent with recent COVID-19 pneumonia.  Mild bilateral hydronephroureter and a 7 mm nonobstructing left renal interpolar calculus seen.  Thickened appearance of the colon seen without evidence of bowel obstruction.  Large hiatal hernia also noted.   Per EDP, melena present on rectal exam which is Hemoccult positive.  [name] was started on broad-spectrum empiric antibiotics  with IV vancomycin, cefepime and Flagyl.  [name] was given IV Protonix bolus and started on continuous infusion.  [name] received 1 L normal saline.  Order for transfuse 2 units PRBCs placed. EDP discussed with on-call GI who is aware of [name]. The hospitalist service was consulted to admit for further evaluation and management.     Assessment & Plan   Principal Problem:   Acute blood loss anemia Active Problems:   Pneumonia due to COVID-19 virus   Dementia (HCC)   Essential hypertension   History of CVA (cerebrovascular accident)   Hypernatremia   Systemic inflammatory response syndrome (SIRS) associated with organ dysfunction (HCC)   AKI (acute kidney injury) (HCC)   Symptomatic acute blood loss anemia due to GI bleed - POA with Hbg 6.0, down from 12.0 at recent discharge.  With melena and hemoccult positive stool in ED.   Transfused 2 units PRBCs. GI consulted, input appreciated. Family spoke with GI and decline endoscopy (1/27). Hemodynamically stable.   Hbg dropped again below 7 last evening, 6.3 this AM -- Transfuse 2 units pRBC's today -- IV Protonix BID -- Monitor H&H's, transfuse if Hg 7 or less -- Maintain 2 large bore iv line -- Page GI if acute hemodynamic changes or s/sx of active GIB -- Hold home aspirin and pharmacologic VTE prophylaxis  SIRS - POA with tachypnea, leukocytosis, lactic acidosis on arrival without any infectious source identified.  Pulmonary infiltrates on xray are related to recent Covid pneumonia. C. difficile and GI pathogen panels are negative.  Was initially covered empirically with broad spectrum  antibiotics (vanc, cefepime, and flagyl). --antibiotics were stopped and [name] remains clinically stable, vitals improved --urine culture negative --blood cx neg x 4 days - follow --continue to monitor  Acute kidney injury - POA.  Improving. Due to prerenal azotemia in setting of GI bleeding and hypovolemia. Improving with IV hydration.  Hold  NSAIDS and ACEI.  Avoid nephrotoxins and hypotension.   --Follow daily BMP's --on IV fluids, reassess need daily & Monitor volume status  Hypokalemia - replaced.  Monitor BMP and replace as needed.  Hypernatremia - Sodium up to 150,secondary to dehydration, poor PO intake.    --On D5w --Nursing: encourage pt to drink water, have water available in his reach on bedside table at all times --Monitor BMP  Diarrhea - due to melena / GI bleeding.  Resolved. C. difficile and GI pathogen panels are negative. Viral gastroenteritis also possible.  Continue supportive care.  Hypertension - chronic, stable. Hold home amlodipine and lisinopril as he has at risk for becoming hypotensive in setting of GI bleed.  Has very low diastolic BP's and overall BP controlled without these, consider if they can be discontinued altogether.  History of CVA - Hold aspirin with ongoing GI bleed.  Resume once Hbg stable and improving.  Recent admission for COVID-19 pneumonia - SARS-CoV-2 PCR + 09/06/2020. Was admitted and discharged 09/12/2020, had completed remdesivir, steroids, ceftriaxone/azithromycin for secondary bacterial pneumonia. He was requiring 2 L O2 via [place] on discharge. This admission, initially was on 1.5-2 L [place] oxygen, has now been weaned to room air.  Dementia - Chronic, no major behavioral disturbances.   Appears declining since his wife passed away per son.  --Resume donepezil     DVT prophylaxis: SCDs Start: 09/18/20 2102   Diet:  Diet Orders (From admission, onward)    Start     Ordered   09/21/20 1059  DIET DYS 2 Room service appropriate? Yes; Fluid consistency: Thin  Diet effective now       Question Answer Comment  Room service appropriate? Yes   Fluid consistency: Thin      09/21/20 1059            Code Status: DNR    Subjective 09/22/20    [name] reports feeling okay today, but says he "isn't sure".  This has been his typical response past few days.  Denies belly  pain or nausea, chest pain, trouble breathing.    Pt dropped his hemoglobin again last evening and will get blood transfusion again today.    Disposition Plan & Communication   Status is: Inpatient  Remains inpatient appropriate because:Ongoing diagnostic testing needed not appropriate for outpatient work up.  Severity of illness with acute blood loss anemia requiring additional blood transfusion today.    Dispo: The [name] is from: SNF              Anticipated d/c is to: SNF              Anticipated d/c date is: 2 days              [name] currently is not medically stable to d/c.   Difficult to place [name] No   Family Communication: spoke with son and daughter-in-law by phone this afternoon.  They would like to tentatively plan for EGD on Monday, while monitoring David over the weekend to see if further transfusions needed.   Consults, Procedures, Significant Events   Consultants:   GI  Procedures:   None  Antimicrobials:  Anti-infectives (  From admission, onward)   Start     Dose/Rate Route Frequency Ordered Stop   09/20/20 2100  vancomycin (VANCOREADY) IVPB 1250 mg/250 mL  Status:  Discontinued        1,250 mg 166.7 mL/hr over 90 Minutes Intravenous Every 48 hours 09/18/20 2127 09/20/20 1736   09/19/20 1900  ceFEPIme (MAXIPIME) 2 g in sodium chloride 0.9 % 100 mL IVPB  Status:  Discontinued        2 g 200 mL/hr over 30 Minutes Intravenous Every 24 hours 09/18/20 2131 09/20/20 1736   09/19/20 0400  metroNIDAZOLE (FLAGYL) IVPB 500 mg  Status:  Discontinued        500 mg 100 mL/hr over 60 Minutes Intravenous Every 8 hours 09/18/20 2116 09/20/20 1736   09/18/20 2130  vancomycin (VANCOREADY) IVPB 750 mg/150 mL        750 mg 150 mL/hr over 60 Minutes Intravenous  Once 09/18/20 2121 09/18/20 2309   09/18/20 1900  ceFEPIme (MAXIPIME) 2 g in sodium chloride 0.9 % 100 mL IVPB        2 g 200 mL/hr over 30 Minutes Intravenous  Once 09/18/20 1854 09/18/20 1954   09/18/20 1900   metroNIDAZOLE (FLAGYL) IVPB 500 mg        500 mg 100 mL/hr over 60 Minutes Intravenous  Once 09/18/20 1854 09/18/20 2026   09/18/20 1900  vancomycin (VANCOCIN) IVPB 1000 mg/200 mL premix        1,000 mg 200 mL/hr over 60 Minutes Intravenous  Once 09/18/20 1854 09/18/20 2142        Objective   Vitals:   09/22/20 0011 09/22/20 0429 09/22/20 0819 09/22/20 1118  BP: (!) 138/91 (!) 138/46 139/75 (!) 130/51  Pulse: 67 70 62 66  Resp: (!) Temp: 97.8 F (36.6 C) 97.8 F (36.6 C) (!) 96.4 F (35.8 C) (!) 97.3 F (36.3 C)  TempSrc: Oral Oral    SpO2: 94% 100% 98% (!) 87%  Weight:      Height:        Intake/Output Summary (Last 24 hours) at 09/22/2020 1337 Last data filed at 09/22/2020 1610 Gross per 24 hour  Intake -  Output 700 ml  Net -700 ml   Filed Weights   09/18/20 1647  Weight: 69.9 kg    Physical Exam:  General exam: awake, alert, no acute distress HEENT: moist mucus membranes, hearing grossly normal  Respiratory system: CATB, normal respiratory effort, on room air. Cardiovascular system: normal S1/S2, RRR, no pedal edema.   Gastrointestinal system: soft, NT, ND, hypoactive bowel sounds. Central nervous system: no gross focal neurologic deficits, normal speech Extremities: no edema, no cyanosis   Labs   Data Reviewed: I have personally reviewed following labs and imaging studies  CBC: Recent Labs  Lab 09/18/20 1655 09/19/20 0441 09/20/20 0520 09/20/20 1219 09/21/20 0545 09/21/20 1215 09/21/20 1818 09/21/20 2347 09/22/20 0610  WBC 26.5* 25.0* 20.9*  --  16.8*  --   --   --  13.9*  NEUTROABS 24.9*  --   --   --   --   --   --   --   --   HGB 6.0* 7.3* 7.8*   < > 7.2* 7.4* 6.5* 6.2* 6.3*  HCT 19.9* 23.0* 24.8*   < > 22.5* 24.3* 20.0* 19.8* 19.3*  MCV 90.0 87.8 88.6  --  87.9  --   --   --  88.5  PLT 420* 300 306  --  313  --   --   --  257   < > = values in this interval not displayed.   Basic Metabolic Panel: Recent Labs  Lab  09/18/20 1655 09/19/20 0441 09/20/20 0520 09/21/20 0545 09/22/20 0610  NA 148* 146* 148* 148* 150*  K 4.1 3.1* 4.7 4.3 3.8  CL 115* 118* 122* 123* 123*  CO2 20* 18* 18* 18* 19*  GLUCOSE 163* 155* 85 115* 157*  BUN 76* 67* 62* 59* 54*  CREATININE 1.88* 1.61* 1.74* 1.92* 1.74*  CALCIUM 8.7* 7.2* 8.1* 8.1* 8.2*  MG  --  2.2  --   --   --    GFR: Estimated Creatinine Clearance: 27.3 mL/min (A) (by C-G formula based on SCr of 1.74 mg/dL (H)). Liver Function Tests: Recent Labs  Lab 09/18/20 1655 09/20/20 0520  AST 38 32  ALT 17 16  ALKPHOS 39 42  BILITOT 0.6 0.5  PROT 5.9* 5.3*  ALBUMIN 2.2* 1.9*   No results for input(s): LIPASE, AMYLASE in the last 168 hours. No results for input(s): AMMONIA in the last 168 hours. Coagulation Profile: Recent Labs  Lab 09/18/20 1911 09/19/20 0441  INR 1.2 1.1   Cardiac Enzymes: No results for input(s): CKTOTAL, CKMB, CKMBINDEX, TROPONINI in the last 168 hours. BNP (last 3 results) No results for input(s): PROBNP in the last 8760 hours. HbA1C: No results for input(s): HGBA1C in the last 72 hours. CBG: Recent Labs  Lab 09/21/20 0730  GLUCAP 92   Lipid Profile: No results for input(s): CHOL, HDL, LDLCALC, TRIG, CHOLHDL, LDLDIRECT in the last 72 hours. Thyroid Function Tests: No results for input(s): TSH, T4TOTAL, FREET4, T3FREE, THYROIDAB in the last 72 hours. Anemia Panel: No results for input(s): VITAMINB12, FOLATE, FERRITIN, TIBC, IRON, RETICCTPCT in the last 72 hours. Sepsis Labs: Recent Labs  Lab 09/18/20 1655 09/18/20 1656 09/18/20 1911 09/19/20 0441 09/20/20 0520  PROCALCITON <0.10  --   --  0.17 0.15  LATICACIDVEN  --  2.9* 1.6  --   --     Recent Results (from the past 240 hour(s))  Urine culture     Status: None   Collection Time: 09/18/20  4:50 PM   Specimen: Urine, Random  Result Value Ref Range Status   Specimen Description   Final    URINE, RANDOM Performed at [facility], [address], [place], [place] [address]    Special Requests   Final    NONE Performed at [facility], [address] [place], [place] [address]    Culture   Final    NO GROWTH Performed at [facility], [address], [place], [place] [address]    Report Status 09/20/2020 FINAL  Final  Blood culture (routine x 2)     Status: None (Preliminary result)   Collection Time: 09/18/20  4:56 PM   Specimen: BLOOD  Result Value Ref Range Status   Specimen Description BLOOD LEFT ANTECUBITAL  Final   Special Requests   Final    BOTTLES DRAWN AEROBIC AND ANAEROBIC Blood Culture adequate volume   Culture   Final    NO GROWTH 4 DAYS Performed at [facility], [address] [place], [place] [address]    Report Status PENDING  Incomplete  Blood culture (routine x 2)     Status: None (Preliminary result)   Collection Time: 09/18/20  5:01 PM   Specimen: BLOOD  Result Value Ref Range Status   Specimen Description BLOOD  BLOOD RIGHT FOREARM  Final   Special Requests   Final    BOTTLES DRAWN AEROBIC AND ANAEROBIC Blood Culture adequate volume   Culture   Final    NO GROWTH 4 DAYS Performed at [facility], [address] [place], [place] [address]    Report Status PENDING  Incomplete  Gastrointestinal Panel by PCR , Stool     Status: None   Collection Time: 09/18/20  5:06 PM   Specimen: Stool  Result Value Ref Range Status   Campylobacter species NOT DETECTED NOT DETECTED Final   Plesimonas shigelloides NOT DETECTED NOT DETECTED Final   Salmonella species NOT DETECTED NOT DETECTED Final   Yersinia enterocolitica NOT DETECTED NOT DETECTED Final   Vibrio species NOT DETECTED NOT DETECTED Final   Vibrio cholerae NOT DETECTED NOT DETECTED Final   Enteroaggregative E coli (EAEC) NOT DETECTED NOT DETECTED Final   Enteropathogenic E coli (EPEC) NOT DETECTED NOT DETECTED Final   Enterotoxigenic E coli (ETEC) NOT DETECTED NOT DETECTED Final   Shiga like toxin producing E  coli (STEC) NOT DETECTED NOT DETECTED Final   Shigella/Enteroinvasive E coli (EIEC) NOT DETECTED NOT DETECTED Final   Cryptosporidium NOT DETECTED NOT DETECTED Final   Cyclospora cayetanensis NOT DETECTED NOT DETECTED Final   Entamoeba histolytica NOT DETECTED NOT DETECTED Final   Giardia lamblia NOT DETECTED NOT DETECTED Final   Adenovirus F40/41 NOT DETECTED NOT DETECTED Final   Astrovirus NOT DETECTED NOT DETECTED Final   Norovirus GI/GII NOT DETECTED NOT DETECTED Final   Rotavirus A NOT DETECTED NOT DETECTED Final   Sapovirus (I, II, IV, and V) NOT DETECTED NOT DETECTED Final    Comment: Performed at [facility], [address] [place], [place] [address]  C Difficile Quick Screen (NO PCR Reflex)     Status: None   Collection Time: 09/18/20  5:07 PM   Specimen: Stool  Result Value Ref Range Status   C Diff antigen NEGATIVE NEGATIVE Final   C Diff toxin NEGATIVE NEGATIVE Final   C Diff interpretation No C. difficile detected.  Final    Comment: Performed at [facility], [address], [place], [place] [address]      Imaging Studies   No results found.   Medications   Scheduled Meds: . sodium chloride   Intravenous Once  . acidophilus  2 capsule Oral TID  . donepezil  5 mg Oral QHS  . pantoprazole  40 mg Intravenous Q12H   Continuous Infusions: . dextrose 100 mL/hr at 09/22/20 0951       LOS: 4 days    Time spent: 25 minutes with greater than 50% spent at bedside and in coordination of care.    Pennie Banter, DO [facility]  09/22/2020, 1:37 PM    If 7PM-7AM, please contact night-coverage. How to contact the [facility] Attending or Consulting provider 7A - 7P or covering provider during after hours 7P -7A, for this [name]?    1. Check the care team in [facility] and look for a) attending/consulting [facility] provider listed and b) the [facility] team listed 2. Log into [email or web address] and use [place]'s universal password to access. If you do not  have the password, please contact the hospital operator. 3. Locate the [facility] provider you are looking for under [facility] and page to a number that you can be directly reached. 4. If you still have difficulty reaching the provider, please page the [facility] (Director on Call) for the Hospitalists listed  on amion for assistance.

## 2020-09-22 NOTE — Care Management Important Message (Signed)
Important Message  Patient Details  Name: David [name] MRN: [id number] Date of Birth: [birth year]   Medicare Important Message Given:  Yes     Allayne Butcher, RN 09/22/2020, 4:16 PM

## 2020-09-22 NOTE — Progress Notes (Signed)
Melodie Bouillon, MD [address], [place], [place], [address] [address], [place], [place], [address] Phone: [phone]  Fax: [phone]   Subjective: Patient remains pleasantly confused.  No nausea or vomiting.  No documented bowel movement since early yesterday morning, therefore over 24 hours ago.  Last bowel movement was brown in color as documented under flowsheets   Objective: Exam: Vital signs in last 24 hours: Vitals:   09/22/20 0011 09/22/20 0429 09/22/20 0819 09/22/20 1118  BP: (!) 138/91 (!) 138/46 139/75 (!) 130/51  Pulse: 67 70 62 66  Resp: (!) 21 19 18 18   Temp: 97.8 F (36.6 C) 97.8 F (36.6 C) (!) 96.4 F (35.8 C) (!) 97.3 F (36.3 C)  TempSrc: Oral Oral    SpO2: 94% 100% 98% (!) 87%  Weight:      Height:       Weight change:   Intake/Output Summary (Last 24 hours) at 09/22/2020 1259 Last data filed at 09/22/2020 09/24/2020 Gross per 24 hour  Intake --  Output 700 ml  Net -700 ml    General: No acute distress Abd: Soft, NT/ND, No HSM Skin: Warm, no rashes Neck: Supple, Trachea midline   Lab Results: Lab Results  Component Value Date   WBC 13.9 (H) 09/22/2020   HGB 6.3 (L) 09/22/2020   HCT 19.3 (L) 09/22/2020   MCV 88.5 09/22/2020   PLT 257 09/22/2020   Micro Results: Recent Results (from the past 240 hour(s))  Urine culture     Status: None   Collection Time: 09/18/20  4:50 PM   Specimen: Urine, Random  Result Value Ref Range Status   Specimen Description   Final    URINE, RANDOM Performed at [facility], [address] [place], [place]    Special Requests   Final    NONE Performed at [facility], [address] [place], [place]    Culture   Final    NO GROWTH Performed at [facility], [address] [place], [place]    Report Status 09/20/2020 FINAL  Final  Blood culture (routine x 2)     Status: None (Preliminary result)   Collection Time:  09/18/20  4:56 PM   Specimen: BLOOD  Result Value Ref Range Status   Specimen Description BLOOD LEFT ANTECUBITAL  Final   Special Requests   Final    BOTTLES DRAWN AEROBIC AND ANAEROBIC Blood Culture adequate volume   Culture   Final    NO GROWTH 4 DAYS Performed at [facility], [address], [place], [place]    Report Status PENDING  Incomplete  Blood culture (routine x 2)     Status: None (Preliminary result)   Collection Time: 09/18/20  5:01 PM   Specimen: BLOOD  Result Value Ref Range Status   Specimen Description BLOOD BLOOD RIGHT FOREARM  Final   Special Requests   Final    BOTTLES DRAWN AEROBIC AND ANAEROBIC Blood Culture adequate volume   Culture   Final    NO GROWTH 4 DAYS Performed at [facility], [address] [place], [place]    Report Status PENDING  Incomplete  Gastrointestinal Panel by PCR , Stool     Status: None   Collection Time: 09/18/20  5:06 PM   Specimen: Stool  Result Value Ref Range Status   Campylobacter species NOT DETECTED NOT DETECTED Final   Plesimonas shigelloides NOT DETECTED  NOT DETECTED Final   Salmonella species NOT DETECTED NOT DETECTED Final   Yersinia enterocolitica NOT DETECTED NOT DETECTED Final   Vibrio species NOT DETECTED NOT DETECTED Final   Vibrio cholerae NOT DETECTED NOT DETECTED Final   Enteroaggregative E coli (EAEC) NOT DETECTED NOT DETECTED Final   Enteropathogenic E coli (EPEC) NOT DETECTED NOT DETECTED Final   Enterotoxigenic E coli (ETEC) NOT DETECTED NOT DETECTED Final   Shiga like toxin producing E coli (STEC) NOT DETECTED NOT DETECTED Final   Shigella/Enteroinvasive E coli (EIEC) NOT DETECTED NOT DETECTED Final   Cryptosporidium NOT DETECTED NOT DETECTED Final   Cyclospora cayetanensis NOT DETECTED NOT DETECTED Final   Entamoeba histolytica NOT DETECTED NOT DETECTED Final   Giardia lamblia NOT DETECTED NOT DETECTED Final   Adenovirus F40/41 NOT DETECTED NOT DETECTED Final    Astrovirus NOT DETECTED NOT DETECTED Final   Norovirus GI/GII NOT DETECTED NOT DETECTED Final   Rotavirus A NOT DETECTED NOT DETECTED Final   Sapovirus (I, II, IV, and V) NOT DETECTED NOT DETECTED Final    Comment: Performed at [facility], [address] [place], [place] [address]  C Difficile Quick Screen (NO PCR Reflex)     Status: None   Collection Time: 09/18/20  5:07 PM   Specimen: Stool  Result Value Ref Range Status   C Diff antigen NEGATIVE NEGATIVE Final   C Diff toxin NEGATIVE NEGATIVE Final   C Diff interpretation No C. difficile detected.  Final    Comment: Performed at [facility], [address] [place], [place] [address]   Studies/Results: No results found. Medications:  Scheduled Meds: . sodium chloride   Intravenous Once  . acidophilus  2 capsule Oral TID  . donepezil  5 mg Oral QHS  . pantoprazole  40 mg Intravenous Q12H   Continuous Infusions: . dextrose 100 mL/hr at 09/22/20 0951   PRN Meds:.ondansetron **OR** ondansetron (ZOFRAN) IV   Assessment: Principal Problem:   Acute blood loss anemia Active Problems:   Pneumonia due to COVID-19 virus   Dementia (HCC)   Essential hypertension   History of CVA (cerebrovascular accident)   Hypernatremia   Systemic inflammatory response syndrome (SIRS) associated with organ dysfunction (HCC)   AKI (acute kidney injury) (HCC)    Plan: Patient's hemoglobin dropped below 7 started yesterday evening No blood transfusion has been given yet and has been ordered by primary team to be given today Patient is hemodynamically stable I talked to the patient's family and updated them about the status.  They would like to discuss it amongst themselves before deciding on any procedures  Dr. Denton Lank plans on calling the patient's family later today as well, and I have informed the family of this and they can ask any questions they have at that time  Patient is on solid food diet today and procedures  would not be done today  Given that it is the weekend, with less staff available and given patient's advanced age and multifocal pneumonia, decision to sedate the patient for endoscopy would also rest on anesthesia staff that is on over the weekend  If patient's family is agreeable to procedures, would recommend n.p.o. past midnight for possible upper endoscopy tomorrow if anesthesia agreeable   LOS: 4 days   Melodie Bouillon, MD 09/22/2020, 12:59 PM

## 2020-09-22 NOTE — Consult Note (Signed)
Consultation Note Date: 09/22/2020   Patient Name: David [name]  DOB: 11/03/1926  MRN: [id number]  Age / Sex: 87 y.o., male  PCP: [facility] Referring Physician: Pennie Banter, DO  Reason for Consultation: Establishing goals of care  HPI/Patient Profile: 87 y.o. male  with past medical history of dementia, history of CVA, hypertension, and RA admitted on 09/18/2020 with shortness of breath and diarrhea. Patient recently admitted from 09/06/2020-09/12/2020 for acute hypoxic respiratory failure due to COVID-19 pneumonia andCAP. This admission found to have hgb of 6. C. difficile and GI pathogen panels both negative. Chest x-ray with interval improvement in bilateral pulmonary opacities from prior. Family initially declined endoscopy; however, hgb dropped again and they are now considering endoscopy for Monday. PMT consulted to discuss GOC.   Clinical Assessment and Goals of Care: I have reviewed medical records including EPIC notes, labs and imaging, received report from Dr. Denton Lank, assessed the patient and then spoke with patient's son and DIL, Minerva Areola and Janett Billow, to discuss diagnosis prognosis, GOC, EOL wishes, disposition and options.  Patient unable to participate in discussion d/t confusion. Received update from RN: confused, some PO intake, on room air, very weak  I introduced Palliative Medicine as specialized medical care for people living with serious illness. It focuses on providing relief from the symptoms and stress of a serious illness. The goal is to improve quality of life for both the patient and the family.  They tell me patient lives at home alone. They live close by and check on him multiple times throughout the day. As far as functional and nutritional status, patient could ambulate independently. He did require some assistance with bathing. They tell me he has maintained a good appetite. They share that he becomes  mildly confused at home; though not as bad as he is now in the hospital.    We discussed patient's current illness and what it means in the larger context of patient's on-going co-morbidities.  Natural disease trajectory and expectations at EOL were discussed. We discuss his ongoing blood loss and potential plan for endoscopy.   I attempted to elicit values and goals of care important to the patient.  Son shares that the patient had a "cancer scare" several months ago and at that time patient made it clear he was not interested in any aggressive medical interventions - he wanted to "allow nature to take its course". We discuss how this affects current situation. Son would not want to pursue any treatment if malignancy is found. He also does not think he would agree to colonoscopy if recommended. We discuss a potential transition to comfort measures depending on how patient does over the weekend, findings of endoscopy.   Discussed with family the importance of continued conversation with family and the medical providers regarding overall plan of care and treatment options, ensuring decisions are within the context of the patient's values and GOCs.    Hospice services outpatient were explained. Again discussed we would follow up next week and make plan depending on how weekend/early next week goes - making all decisions in light of patient's previously stated wishes.   Questions and concerns were addressed. The family was encouraged to call with questions or concerns.   Primary Decision Maker NEXT OF KIN - son Minerva Areola    SUMMARY OF RECOMMENDATIONS   - continue current measures, family open to endoscopy early next week - depending on how weekend goes and findings of endoscopy will guide dispo plan -  hospice care at home introduced but unsure if appropriate at this time - family does not think they would want to move forward with colonoscopy if recommended  Code Status/Advance Care  Planning:  DNR  Discharge Planning: To Be Determined SNF rehab with palliative to follow vs hospice at home     Primary Diagnoses: Present on Admission: . Acute blood loss anemia . Dementia (HCC) . Essential hypertension . Hypernatremia . Systemic inflammatory response syndrome (SIRS) associated with organ dysfunction (HCC) . AKI (acute kidney injury) (HCC) . Pneumonia due to COVID-19 virus   I have reviewed the medical record, interviewed the patient and family, and examined the patient. The following aspects are pertinent.  Past Medical History:  Diagnosis Date  . Anxiety   . Arthritis    Rheumatoid  . Dementia (HCC)    Dx in May  . Hypertension   . Stroke [facility])    Social History   Socioeconomic History  . Marital status: Married    Spouse name: Not on file  . Number of children: Not on file  . Years of education: Not on file  . Highest education level: Not on file  Occupational History  . Not on file  Tobacco Use  . Smoking status: Former Smoker    Types: Cigarettes  . Smokeless tobacco: Never Used  Substance and Sexual Activity  . Alcohol use: No  . Drug use: Not on file  . Sexual activity: Not on file  Other Topics Concern  . Not on file  Social History Narrative  . Not on file   Social Determinants of Health   Financial Resource Strain: Not on file  Food Insecurity: Not on file  Transportation Needs: Not on file  Physical Activity: Not on file  Stress: Not on file  Social Connections: Not on file   Family History  Problem Relation Age of Onset  . Hypertension Mother    Scheduled Meds: . sodium chloride   Intravenous Once  . acidophilus  2 capsule Oral TID  . donepezil  5 mg Oral QHS  . pantoprazole  40 mg Intravenous Q12H   Continuous Infusions: . dextrose 100 mL/hr at 09/22/20 0951   PRN Meds:.ondansetron **OR** ondansetron (ZOFRAN) IV No Known Allergies  Vital Signs: BP (!) 152/47 (BP Location: Left Arm)   Pulse 61   Temp (!)  97.1 F (36.2 C)   Resp 18   Ht 5\' 9"  (1.753 m)   Wt 69.9 kg   SpO2 100%   BMI 22.74 kg/m  Pain Scale: 0-10   Pain Score: 0-No pain   SpO2: SpO2: 100 % O2 Device:SpO2: 100 % O2 Flow Rate: .O2 Flow Rate (L/min): 2 L/min  IO: Intake/output summary:   Intake/Output Summary (Last 24 hours) at 09/22/2020 1640 Last data filed at 09/22/2020 09/24/2020 Gross per 24 hour  Intake --  Output 700 ml  Net -700 ml    LBM: Last BM Date: 09/20/20 Baseline Weight: Weight: 69.9 kg Most recent weight: Weight: 69.9 kg     Palliative Assessment/Data: PPS 40%    The above conversation was completed via telephone due to the visitor restrictions during the COVID-19 pandemic. Thorough chart review and discussion with necessary members of the care team was completed as part of assessment. All issues were discussed and addressed but no physical exam was performed.  Time Total: 60 minutes Greater than 50%  of this time was spent counseling and coordinating care related to the above assessment and plan.  09/22/20  Charyl Bigger, DNP, AGNP-C Palliative Medicine Team [phone] Pager: [phone]

## 2020-09-23 DIAGNOSIS — I10 Essential (primary) hypertension: Secondary | ICD-10-CM | POA: Diagnosis not present

## 2020-09-23 DIAGNOSIS — D62 Acute posthemorrhagic anemia: Secondary | ICD-10-CM | POA: Diagnosis not present

## 2020-09-23 DIAGNOSIS — E870 Hyperosmolality and hypernatremia: Secondary | ICD-10-CM | POA: Diagnosis not present

## 2020-09-23 DIAGNOSIS — F0390 Unspecified dementia without behavioral disturbance: Secondary | ICD-10-CM | POA: Diagnosis not present

## 2020-09-23 LAB — TYPE AND SCREEN
ABO/RH(D): O POS
Antibody Screen: NEGATIVE
Unit division: 0
Unit division: 0

## 2020-09-23 LAB — CULTURE, BLOOD (ROUTINE X 2)
Culture: NO GROWTH
Culture: NO GROWTH
Special Requests: ADEQUATE
Special Requests: ADEQUATE

## 2020-09-23 LAB — BASIC METABOLIC PANEL
Anion gap: 6 (ref 5–15)
BUN: 45 mg/dL — ABNORMAL HIGH (ref 8–23)
CO2: 20 mmol/L — ABNORMAL LOW (ref 22–32)
Calcium: 8.2 mg/dL — ABNORMAL LOW (ref 8.9–10.3)
Chloride: 119 mmol/L — ABNORMAL HIGH (ref 98–111)
Creatinine, Ser: 1.71 mg/dL — ABNORMAL HIGH (ref 0.61–1.24)
GFR, Estimated: 37 mL/min — ABNORMAL LOW (ref >60)
Glucose, Bld: 119 mg/dL — ABNORMAL HIGH (ref 70–99)
Potassium: 4.3 mmol/L (ref 3.5–5.1)
Sodium: 145 mmol/L (ref 135–145)

## 2020-09-23 LAB — CBC
HCT: 28.2 % — ABNORMAL LOW (ref 39.0–52.0)
Hemoglobin: 9.1 g/dL — ABNORMAL LOW (ref 13.0–17.0)
MCH: 29.1 pg (ref 26.0–34.0)
MCHC: 32.3 g/dL (ref 30.0–36.0)
MCV: 90.1 fL (ref 80.0–100.0)
Platelets: 218 10*3/uL (ref 150–400)
RBC: 3.13 MIL/uL — ABNORMAL LOW (ref 4.22–5.81)
RDW: 17 % — ABNORMAL HIGH (ref 11.5–15.5)
WBC: 12.3 10*3/uL — ABNORMAL HIGH (ref 4.0–10.5)
nRBC: 0 % (ref 0.0–0.2)

## 2020-09-23 LAB — BPAM RBC
Blood Product Expiration Date: 202203032359
Blood Product Expiration Date: 202203032359
ISSUE DATE / TIME: 202201281726
ISSUE DATE / TIME: 202201282130
Unit Type and Rh: 5100
Unit Type and Rh: 5100

## 2020-09-23 LAB — HEMOGLOBIN AND HEMATOCRIT, BLOOD
HCT: 28.9 % — ABNORMAL LOW (ref 39.0–52.0)
HCT: 27.1 % — ABNORMAL LOW (ref 39.0–52.0)
HCT: 26.6 % — ABNORMAL LOW (ref 39.0–52.0)
Hemoglobin: 9.6 g/dL — ABNORMAL LOW (ref 13.0–17.0)
Hemoglobin: 8.9 g/dL — ABNORMAL LOW (ref 13.0–17.0)
Hemoglobin: 8.7 g/dL — ABNORMAL LOW (ref 13.0–17.0)

## 2020-09-23 MED ORDER — BISACODYL 5 MG PO TBEC: 5.0000 mg | DELAYED_RELEASE_TABLET | Freq: Every day | ORAL | Status: DC | PRN

## 2020-09-23 MED ORDER — POLYETHYLENE GLYCOL 3350 17 G PO PACK
17.0000 g | PACK | Freq: Every day | ORAL | Status: DC
  Administered 2020-09-23 – 2020-09-28 (×5): 17 g via ORAL
  Filled 2020-09-23 (×6): qty 1

## 2020-09-23 MED ORDER — AMLODIPINE BESYLATE 5 MG PO TABS
5.0000 mg | ORAL_TABLET | Freq: Every day | ORAL | Status: DC
  Administered 2020-09-23 – 2020-09-24 (×2): 5 mg via ORAL
  Filled 2020-09-23 (×2): qty 1

## 2020-09-23 MED ORDER — HYDRALAZINE HCL 50 MG PO TABS
50.0000 mg | ORAL_TABLET | Freq: Four times a day (QID) | ORAL | Status: DC | PRN
  Administered 2020-09-23 – 2020-09-24 (×3): 50 mg via ORAL
  Filled 2020-09-23 (×3): qty 1

## 2020-09-23 MED ORDER — SENNOSIDES-DOCUSATE SODIUM 8.6-50 MG PO TABS
1.0000 | ORAL_TABLET | Freq: Two times a day (BID) | ORAL | Status: DC
  Administered 2020-09-23 – 2020-09-29 (×10): 1 via ORAL
  Filled 2020-09-23 (×10): qty 1

## 2020-09-23 NOTE — Progress Notes (Signed)
PROGRESS NOTE    David [name]   EAV:[id number]  DOB: 01-24-1927  PCP: [facility]    DOA: 09/18/2020 LOS: 5   Brief Narrative   David [name] is a 87 y.o. male with medical history significant for dementia, history of CVA, hypertension, recent admission for COVID-19 pneumonia who presents to the ED from SNF for evaluation of shortness of breath and diarrhea.  History is limited from patient due to underlying dementia and is otherwise supplemented by EDP, chart review, and patient's son by phone.   Patient recently admitted from 09/06/2020-09/12/2020 for acute hypoxic respiratory failure due to COVID-19 pneumonia and CAP.  Patient was treated with IV remdesivir, steroids, IV ceftriaxone/azithromycin.  He was discharged on 2 L of home O2 via [place] to SNF.  He was also noted to have orthostatic hypotension while in the hospital.   Patient returns to the ED from SNF due to reported shortness of breath and loose stools. Per ED triage notes patient was brought in via EMS on 100% NRB but switched to 3 L supplemental O2 via [place] on arrival. Patient complains of diarrhea and abdominal pain but is not able to provide further history.     Labs notable for BUN 76, creatinine 1.88 (1.08 on 09/11/2020), hemoglobin 6.0 (12.0 on 09/10/2020), WBC 26.5, hs-troponin 43, lactic acid 2.9 > 1.6. C. difficile and GI pathogen panels both negative.   Chest x-ray with interval improvement in bilateral pulmonary opacities from prior.   CTA chest/abdomen/pelvis obtained.  Negative for evidence of PE.  Bilateral groundglass opacities seen consistent with recent COVID-19 pneumonia.  Mild bilateral hydronephroureter and a 7 mm nonobstructing left renal interpolar calculus seen.  Thickened appearance of the colon seen without evidence of bowel obstruction.  Large hiatal hernia also noted.   Per EDP, melena present on rectal exam which is Hemoccult positive.  Patient was started on broad-spectrum empiric antibiotics  with IV vancomycin, cefepime and Flagyl.  Patient was given IV Protonix bolus and started on continuous infusion.  Patient received 1 L normal saline.  Order for transfuse 2 units PRBCs placed. EDP discussed with on-call GI who is aware of patient. The hospitalist service was consulted to admit for further evaluation and management.     Assessment & Plan   Principal Problem:   Acute blood loss anemia Active Problems:   Pneumonia due to COVID-19 virus   Dementia (HCC)   Essential hypertension   History of CVA (cerebrovascular accident)   Hypernatremia   Systemic inflammatory response syndrome (SIRS) associated with organ dysfunction (HCC)   AKI (acute kidney injury) (HCC)   Symptomatic acute blood loss anemia due to GI bleed - POA with Hbg 6.0, down from 12.0 at recent discharge.  With melena and hemoccult positive stool in ED.   Transfused 2 units PRBCs on admission, 2 units 1/28. GI consulted, input appreciated. Family want to monitor this weekend, tentative plan for EGD Monday if he continues to bleed. Hemodynamically stable.   1/29: s/p 2 units yesterday, Hbg 9.6 this AM -- IV Protonix BID -- Monitor H&H's, transfuse if Hg 7 or less -- Maintain 2 large bore iv line -- Page GI if acute hemodynamic changes or s/sx of active GIB -- Hold home aspirin and pharmacologic VTE prophylaxis  SIRS - POA with tachypnea, leukocytosis, lactic acidosis on arrival without any infectious source identified.  Pulmonary infiltrates on xray are related to recent Covid pneumonia. C. difficile and GI pathogen panels are negative.  Was  initially covered empirically with broad spectrum antibiotics (vanc, cefepime, and flagyl). --antibiotics were stopped and patient remains clinically stable, vitals improved --urine culture negative --blood cx neg --continue to monitor  Acute kidney injury - POA.  Improving. Due to prerenal azotemia in setting of GI bleeding and hypovolemia. Improving with IV  hydration.  Hold NSAIDS and ACEI.  Avoid nephrotoxins and hypotension.   --Follow daily BMP's --on IV fluids, reassess need daily & Monitor volume status  Hypokalemia - replaced.  Monitor BMP and replace as needed.  Hypernatremia - Resolved with d5w. Na 145 today. --Continue D5W, reduce to 50 cc/hr --Nursing: encourage pt to drink water, have water available in his reach on bedside table at all times --Monitor BMP  Constipation - abdomen hard, hx of severe constipation.  Bowel regimen, hold if loose stools or frequent BM's.  Diarrhea RESOLVED - due to melena / GI bleeding.  Resolved. C. difficile and GI pathogen panels are negative. Viral gastroenteritis also possible.  Continue supportive care.  Hypertension - chronic, stable. Resume home amlodipine.  Continue to hold lisinopril so as not to over-treat; still some risk for hypotension if bleeding recurs.  Also low diastolic BP's.  History of CVA - Hold aspirin with ongoing GI bleed.  Resume once Hbg stable and improving.  Recent admission for COVID-19 pneumonia - SARS-CoV-2 PCR + 09/06/2020. Was admitted and discharged 09/12/2020, had completed remdesivir, steroids, ceftriaxone/azithromycin for secondary bacterial pneumonia. He was requiring 2 L O2 via [place] on discharge. This admission, initially was on 1.5-2 L [place] oxygen, has now been weaned to room air. --Transfer off Covid Unit  Dementia - Chronic, no major behavioral disturbances.   Appears declining since his wife passed away per son.  --Resume donepezil     DVT prophylaxis: SCDs Start: 09/18/20 2102   Diet:  Diet Orders (From admission, onward)    Start     Ordered   09/25/20 0001  Diet NPO time specified Except for: Sips with Meds  Diet effective midnight       Question:  Except for  Answer:  Clearance Coots with Meds   09/22/20 1614   09/21/20 1059  DIET DYS 2 Room service appropriate? Yes; Fluid consistency: Thin  Diet effective now       Question Answer Comment  Room  service appropriate? Yes   Fluid consistency: Thin      09/21/20 1059            Code Status: DNR    Subjective 09/23/20    Patient awake, sitting up in bed.  Says he feels okay.  Says he wants to see Minerva Areola and Janett Billow.  Told him we can get him to another unit, off Covid isolation, so he can have visitor. He was pleased and smiled.  Has no acute complaints.  Isn't sure if he's had BM's or further melena.   Disposition Plan & Communication   Status is: Inpatient  Remains inpatient appropriate because:Ongoing diagnostic testing needed not appropriate for outpatient work up.  Severity of illness with acute blood loss anemia requiring transfusions yesterday, monitoring closely.    Dispo: The patient is from: SNF              Anticipated d/c is to: SNF              Anticipated d/c date is: 2 days              Patient currently is not medically stable to d/c.   Difficult to  place patient No   Family Communication: spoke with son and daughter-in-law by phone 1/28 afternoon.     Consults, Procedures, Significant Events   Consultants:   GI  Procedures:   None  Antimicrobials:  Anti-infectives (From admission, onward)   Start     Dose/Rate Route Frequency Ordered Stop   09/20/20 2100  vancomycin (VANCOREADY) IVPB 1250 mg/250 mL  Status:  Discontinued        1,250 mg 166.7 mL/hr over 90 Minutes Intravenous Every 48 hours 09/18/20 2127 09/20/20 1736   09/19/20 1900  ceFEPIme (MAXIPIME) 2 g in sodium chloride 0.9 % 100 mL IVPB  Status:  Discontinued        2 g 200 mL/hr over 30 Minutes Intravenous Every 24 hours 09/18/20 2131 09/20/20 1736   09/19/20 0400  metroNIDAZOLE (FLAGYL) IVPB 500 mg  Status:  Discontinued        500 mg 100 mL/hr over 60 Minutes Intravenous Every 8 hours 09/18/20 2116 09/20/20 1736   09/18/20 2130  vancomycin (VANCOREADY) IVPB 750 mg/150 mL        750 mg 150 mL/hr over 60 Minutes Intravenous  Once 09/18/20 2121 09/18/20 2309   09/18/20 1900  ceFEPIme  (MAXIPIME) 2 g in sodium chloride 0.9 % 100 mL IVPB        2 g 200 mL/hr over 30 Minutes Intravenous  Once 09/18/20 1854 09/18/20 1954   09/18/20 1900  metroNIDAZOLE (FLAGYL) IVPB 500 mg        500 mg 100 mL/hr over 60 Minutes Intravenous  Once 09/18/20 1854 09/18/20 2026   09/18/20 1900  vancomycin (VANCOCIN) IVPB 1000 mg/200 mL premix        1,000 mg 200 mL/hr over 60 Minutes Intravenous  Once 09/18/20 1854 09/18/20 2142        Objective   Vitals:   09/23/20 0418 09/23/20 0527 09/23/20 0800 09/23/20 1642  BP: (!) 163/77 (!) 159/63 (!) 154/85 (!) 157/96  Pulse: 62 62 65 67  Resp:  18 20 18   Temp: 98 F (36.7 C) (!) 97.4 F (36.3 C) (!) 97.5 F (36.4 C) (!) 97.5 F (36.4 C)  TempSrc:  Axillary Axillary Oral  SpO2: 100% 100% 100% 99%  Weight:      Height:        Intake/Output Summary (Last 24 hours) at 09/23/2020 1645 Last data filed at 09/23/2020 2376 Gross per 24 hour  Intake 2702.58 ml  Output 150 ml  Net 2552.58 ml   Filed Weights   09/18/20 1647  Weight: 69.9 kg    Physical Exam:  General exam: awake, alert, no acute distress HEENT: dry mucus membranes, hearing grossly normal  Respiratory system: symmetric chest rise, normal respiratory effort, on room air. Cardiovascular system: RRR, no pedal edema.  Gastrointestinal system: soft, hard but not distended, hypoactive bowel sounds, non-tender. Central nervous system: no gross focal neurologic deficits, normal speech Extremities: no edema, no cyanosis   Labs   Data Reviewed: I have personally reviewed following labs and imaging studies  CBC: Recent Labs  Lab 09/18/20 1655 09/19/20 0441 09/20/20 0520 09/20/20 1219 09/21/20 0545 09/21/20 1215 09/21/20 2347 09/22/20 0610 09/23/20 0255 09/23/20 0818 09/23/20 1535  WBC 26.5* 25.0* 20.9*  --  16.8*  --   --  13.9* 12.3*  --   --   NEUTROABS 24.9*  --   --   --   --   --   --   --   --   --   --  HGB 6.0* 7.3* 7.8*   < > 7.2*   < > 6.2* 6.3* 9.1* 9.6*  8.9*  HCT 19.9* 23.0* 24.8*   < > 22.5*   < > 19.8* 19.3* 28.2* 28.9* 27.1*  MCV 90.0 87.8 88.6  --  87.9  --   --  88.5 90.1  --   --   PLT 420* 300 306  --  313  --   --  257 218  --   --    < > = values in this interval not displayed.   Basic Metabolic Panel: Recent Labs  Lab 09/19/20 0441 09/20/20 0520 09/21/20 0545 09/22/20 0610 09/23/20 0255  NA 146* 148* 148* 150* 145  K 3.1* 4.7 4.3 3.8 4.3  CL 118* 122* 123* 123* 119*  CO2 18* 18* 18* 19* 20*  GLUCOSE 155* 85 115* 157* 119*  BUN 67* 62* 59* 54* 45*  CREATININE 1.61* 1.74* 1.92* 1.74* 1.71*  CALCIUM 7.2* 8.1* 8.1* 8.2* 8.2*  MG 2.2  --   --   --   --    GFR: Estimated Creatinine Clearance: 27.8 mL/min (A) (by C-G formula based on SCr of 1.71 mg/dL (H)). Liver Function Tests: Recent Labs  Lab 09/18/20 1655 09/20/20 0520  AST 38 32  ALT 17 16  ALKPHOS 39 42  BILITOT 0.6 0.5  PROT 5.9* 5.3*  ALBUMIN 2.2* 1.9*   No results for input(s): LIPASE, AMYLASE in the last 168 hours. No results for input(s): AMMONIA in the last 168 hours. Coagulation Profile: Recent Labs  Lab 09/18/20 1911 09/19/20 0441  INR 1.2 1.1   Cardiac Enzymes: No results for input(s): CKTOTAL, CKMB, CKMBINDEX, TROPONINI in the last 168 hours. BNP (last 3 results) No results for input(s): PROBNP in the last 8760 hours. HbA1C: No results for input(s): HGBA1C in the last 72 hours. CBG: Recent Labs  Lab 09/21/20 0730  GLUCAP 92   Lipid Profile: No results for input(s): CHOL, HDL, LDLCALC, TRIG, CHOLHDL, LDLDIRECT in the last 72 hours. Thyroid Function Tests: No results for input(s): TSH, T4TOTAL, FREET4, T3FREE, THYROIDAB in the last 72 hours. Anemia Panel: No results for input(s): VITAMINB12, FOLATE, FERRITIN, TIBC, IRON, RETICCTPCT in the last 72 hours. Sepsis Labs: Recent Labs  Lab 09/18/20 1655 09/18/20 1656 09/18/20 1911 09/19/20 0441 09/20/20 0520  PROCALCITON <0.10  --   --  0.17 0.15  LATICACIDVEN  --  2.9* 1.6  --   --      Recent Results (from the past 240 hour(s))  Urine culture     Status: None   Collection Time: 09/18/20  4:50 PM   Specimen: Urine, Random  Result Value Ref Range Status   Specimen Description   Final    URINE, RANDOM Performed at [facility], [address], [place], [place] [address]    Special Requests   Final    NONE Performed at [facility], [address], [place], [place] [address]    Culture   Final    NO GROWTH Performed at [facility], [address] [place], [place] [address]    Report Status 09/20/2020 FINAL  Final  Blood culture (routine x 2)     Status: None   Collection Time: 09/18/20  4:56 PM   Specimen: BLOOD  Result Value Ref Range Status   Specimen Description BLOOD LEFT ANTECUBITAL  Final   Special Requests   Final    BOTTLES DRAWN AEROBIC AND ANAEROBIC Blood Culture adequate volume  Culture   Final    NO GROWTH 5 DAYS Performed at [facility], [address] [place], [place] [address]    Report Status 09/23/2020 FINAL  Final  Blood culture (routine x 2)     Status: None   Collection Time: 09/18/20  5:01 PM   Specimen: BLOOD  Result Value Ref Range Status   Specimen Description BLOOD BLOOD RIGHT FOREARM  Final   Special Requests   Final    BOTTLES DRAWN AEROBIC AND ANAEROBIC Blood Culture adequate volume   Culture   Final    NO GROWTH 5 DAYS Performed at [facility], [address] [place], [place] [address]    Report Status 09/23/2020 FINAL  Final  Gastrointestinal Panel by PCR , Stool     Status: None   Collection Time: 09/18/20  5:06 PM   Specimen: Stool  Result Value Ref Range Status   Campylobacter species NOT DETECTED NOT DETECTED Final   Plesimonas shigelloides NOT DETECTED NOT DETECTED Final   Salmonella species NOT DETECTED NOT DETECTED Final   Yersinia enterocolitica NOT DETECTED NOT DETECTED Final   Vibrio species NOT DETECTED NOT DETECTED Final   Vibrio cholerae NOT DETECTED  NOT DETECTED Final   Enteroaggregative E coli (EAEC) NOT DETECTED NOT DETECTED Final   Enteropathogenic E coli (EPEC) NOT DETECTED NOT DETECTED Final   Enterotoxigenic E coli (ETEC) NOT DETECTED NOT DETECTED Final   Shiga like toxin producing E coli (STEC) NOT DETECTED NOT DETECTED Final   Shigella/Enteroinvasive E coli (EIEC) NOT DETECTED NOT DETECTED Final   Cryptosporidium NOT DETECTED NOT DETECTED Final   Cyclospora cayetanensis NOT DETECTED NOT DETECTED Final   Entamoeba histolytica NOT DETECTED NOT DETECTED Final   Giardia lamblia NOT DETECTED NOT DETECTED Final   Adenovirus F40/41 NOT DETECTED NOT DETECTED Final   Astrovirus NOT DETECTED NOT DETECTED Final   Norovirus GI/GII NOT DETECTED NOT DETECTED Final   Rotavirus A NOT DETECTED NOT DETECTED Final   Sapovirus (I, II, IV, and V) NOT DETECTED NOT DETECTED Final    Comment: Performed at [facility], [address] [place], [place] [address]  C Difficile Quick Screen (NO PCR Reflex)     Status: None   Collection Time: 09/18/20  5:07 PM   Specimen: Stool  Result Value Ref Range Status   C Diff antigen NEGATIVE NEGATIVE Final   C Diff toxin NEGATIVE NEGATIVE Final   C Diff interpretation No C. difficile detected.  Final    Comment: Performed at [facility], [address] [place], [place] [address]      Imaging Studies   No results found.   Medications   Scheduled Meds: . acidophilus  2 capsule Oral TID  . amLODipine  5 mg Oral Daily  . donepezil  5 mg Oral QHS  . pantoprazole  40 mg Intravenous Q12H   Continuous Infusions: . dextrose 50 mL/hr at 09/23/20 0939       LOS: 5 days    Time spent: 25 minutes with greater than 50% spent at bedside and in coordination of care.    Pennie Banter, DO [facility]  09/23/2020, 4:45 PM    If 7PM-7AM, please contact night-coverage. How to contact the [facility] Attending or Consulting provider 7A - 7P or covering provider during after hours  7P -7A, for this patient?    1. Check the care team in [facility] and look for a) attending/consulting [facility] provider listed and b) the [facility] team  listed 2. Log into [email or web address] and use [facility]'s universal password to access. If you do not have the password, please contact the hospital operator. 3. Locate the [facility] provider you are looking for under [facility] and page to a number that you can be directly reached. 4. If you still have difficulty reaching the provider, please page the [facility] (Director on Call) for the Hospitalists listed on amion for assistance.

## 2020-09-23 NOTE — Care Plan (Signed)
Patient seen. Confused. Hemoglobin stable. Family discussing possible GI procedures.   Merlyn Lot MD, MPH [facility]

## 2020-09-23 NOTE — Progress Notes (Signed)
Updated son David [name] via telephone.

## 2020-09-24 DIAGNOSIS — F0390 Unspecified dementia without behavioral disturbance: Secondary | ICD-10-CM | POA: Diagnosis not present

## 2020-09-24 DIAGNOSIS — D62 Acute posthemorrhagic anemia: Secondary | ICD-10-CM | POA: Diagnosis not present

## 2020-09-24 DIAGNOSIS — I10 Essential (primary) hypertension: Secondary | ICD-10-CM | POA: Diagnosis not present

## 2020-09-24 LAB — BASIC METABOLIC PANEL
Anion gap: 8 (ref 5–15)
BUN: 35 mg/dL — ABNORMAL HIGH (ref 8–23)
CO2: 20 mmol/L — ABNORMAL LOW (ref 22–32)
Calcium: 8.1 mg/dL — ABNORMAL LOW (ref 8.9–10.3)
Chloride: 114 mmol/L — ABNORMAL HIGH (ref 98–111)
Creatinine, Ser: 1.35 mg/dL — ABNORMAL HIGH (ref 0.61–1.24)
GFR, Estimated: 50 mL/min — ABNORMAL LOW (ref >60)
Glucose, Bld: 107 mg/dL — ABNORMAL HIGH (ref 70–99)
Potassium: 4.4 mmol/L (ref 3.5–5.1)
Sodium: 142 mmol/L (ref 135–145)

## 2020-09-24 LAB — HEMOGLOBIN AND HEMATOCRIT, BLOOD
HCT: 28.3 % — ABNORMAL LOW (ref 39.0–52.0)
Hemoglobin: 9 g/dL — ABNORMAL LOW (ref 13.0–17.0)

## 2020-09-24 LAB — CBC
HCT: 28.3 % — ABNORMAL LOW (ref 39.0–52.0)
Hemoglobin: 9.5 g/dL — ABNORMAL LOW (ref 13.0–17.0)
MCH: 29.7 pg (ref 26.0–34.0)
MCHC: 33.6 g/dL (ref 30.0–36.0)
MCV: 88.4 fL (ref 80.0–100.0)
Platelets: 217 10*3/uL (ref 150–400)
RBC: 3.2 MIL/uL — ABNORMAL LOW (ref 4.22–5.81)
RDW: 17.1 % — ABNORMAL HIGH (ref 11.5–15.5)
WBC: 12.5 10*3/uL — ABNORMAL HIGH (ref 4.0–10.5)
nRBC: 0 % (ref 0.0–0.2)

## 2020-09-24 NOTE — Progress Notes (Signed)
Pt up in chair at this time with max assist x 2

## 2020-09-24 NOTE — Progress Notes (Signed)
PROGRESS NOTE    David [name]   XBJ:[id number]  DOB: April 07, 1927  PCP: [facility]    DOA: 09/18/2020 LOS: 6   Brief Narrative   David [name] is a 87 y.o. male with medical history significant for dementia, history of CVA, hypertension, recent admission for COVID-19 pneumonia who presents to the ED from SNF for evaluation of shortness of breath and diarrhea.  History is limited from patient due to underlying dementia and is otherwise supplemented by EDP, chart review, and patient's son by phone.   Patient recently admitted from 09/06/2020-09/12/2020 for acute hypoxic respiratory failure due to COVID-19 pneumonia and CAP.  Patient was treated with IV remdesivir, steroids, IV ceftriaxone/azithromycin.  He was discharged on 2 L of home O2 via [place] to SNF.  He was also noted to have orthostatic hypotension while in the hospital.   Patient returns to the ED from SNF due to reported shortness of breath and loose stools. Per ED triage notes patient was brought in via EMS on 100% NRB but switched to 3 L supplemental O2 via [place] on arrival. Patient complains of diarrhea and abdominal pain but is not able to provide further history.     Labs notable for BUN 76, creatinine 1.88 (1.08 on 09/11/2020), hemoglobin 6.0 (12.0 on 09/10/2020), WBC 26.5, hs-troponin 43, lactic acid 2.9 > 1.6. C. difficile and GI pathogen panels both negative.   Chest x-ray with interval improvement in bilateral pulmonary opacities from prior.   CTA chest/abdomen/pelvis obtained.  Negative for evidence of PE.  Bilateral groundglass opacities seen consistent with recent COVID-19 pneumonia.  Mild bilateral hydronephroureter and a 7 mm nonobstructing left renal interpolar calculus seen.  Thickened appearance of the colon seen without evidence of bowel obstruction.  Large hiatal hernia also noted.   Per EDP, melena present on rectal exam which is Hemoccult positive.  Patient was started on broad-spectrum empiric antibiotics  with IV vancomycin, cefepime and Flagyl.  Patient was given IV Protonix bolus and started on continuous infusion.  Patient received 1 L normal saline.  Order for transfuse 2 units PRBCs placed. EDP discussed with on-call GI who is aware of patient. The hospitalist service was consulted to admit for further evaluation and management.     Assessment & Plan   Principal Problem:   Acute blood loss anemia Active Problems:   Pneumonia due to COVID-19 virus   Dementia (HCC)   Essential hypertension   History of CVA (cerebrovascular accident)   Hypernatremia   Systemic inflammatory response syndrome (SIRS) associated with organ dysfunction (HCC)   AKI (acute kidney injury) (HCC)   Symptomatic acute blood loss anemia due to GI bleed - POA with Hbg 6.0, down from 12.0 at recent discharge.  With melena and hemoccult positive stool in ED.   Transfused 2 units PRBCs on admission, 2 units 1/28. GI consulted, input appreciated. Family want to monitor this weekend, tentative plan for EGD Monday if he continues to bleed. Hemodynamically stable.   1/29: s/p 2 units yesterday, Hbg 9.6 this AM -- IV Protonix BID -- Monitor H&H's, transfuse if Hg 7 or less -- Maintain 2 large bore iv line -- Page GI if acute hemodynamic changes or s/sx of active GIB -- Hold home aspirin and pharmacologic VTE prophylaxis  Recent admission for COVID-19 pneumonia - SARS-CoV-2 PCR + 09/06/2020. Was admitted and discharged 09/12/2020, had completed remdesivir, steroids, ceftriaxone/azithromycin for secondary bacterial pneumonia. He was requiring 2 L O2 via [place] on discharge. This admission,  initially was on 1.5-2 L [place] oxygen, has now been weaned to room air. --Transfer off Covid Unit  -- May have visitor  SIRS - POA with tachypnea, leukocytosis, lactic acidosis on arrival without any infectious source identified.  Pulmonary infiltrates on xray are related to recent Covid pneumonia. C. difficile and GI pathogen panels are  negative.  Was initially covered empirically with broad spectrum antibiotics (vanc, cefepime, and flagyl). --antibiotics were stopped and patient remains clinically stable, vitals improved --urine culture negative --blood cx neg --continue to monitor  Acute kidney injury - POA.  Improving. Due to prerenal azotemia in setting of GI bleeding and hypovolemia. Improving with IV hydration.  Hold NSAIDS and ACEI.  Avoid nephrotoxins and hypotension.   --Follow daily BMP's --on IV fluids, reassess need daily & Monitor volume status  Hypokalemia - replaced.  Monitor BMP and replace as needed.  Hypernatremia - Resolved with d5w. Na 145 today. --Continue D5W, reduce to 50 cc/hr --Nursing: encourage pt to drink water, have water available in his reach on bedside table at all times --Monitor BMP  Constipation - abdomen hard, hx of severe constipation.  Bowel regimen, hold if loose stools or frequent BM's.  Diarrhea RESOLVED - due to melena / GI bleeding.  Resolved. C. difficile and GI pathogen panels are negative. Viral gastroenteritis also possible.  Continue supportive care.  Hypertension - chronic, stable. Resume home amlodipine.  Continue to hold lisinopril so as not to over-treat; still some risk for hypotension if bleeding recurs.  Also low diastolic BP's.  History of CVA - Hold aspirin with ongoing GI bleed.  Resume once Hbg stable and improving.  Dementia - Chronic, no major behavioral disturbances.   Appears declining since his wife passed away per son.  --Continue donepezil     DVT prophylaxis: SCDs Start: 09/18/20 2102   Diet:  Diet Orders (From admission, onward)    Start     Ordered   09/25/20 0001  Diet NPO time specified Except for: Sips with Meds  Diet effective midnight       Question:  Except for  Answer:  Clearance Coots with Meds   09/22/20 1614   09/21/20 1059  DIET DYS 2 Room service appropriate? Yes; Fluid consistency: Thin  Diet effective now       Question  Answer Comment  Room service appropriate? Yes   Fluid consistency: Thin      09/21/20 1059            Code Status: DNR    Subjective 09/24/20    Patient awake laying in bed.  Says he doesn't feel too well.  Unable to provide specific symptoms however.  Denies SOB, CP, N/V, headache or stomach problems.  Hemoglobin appears to have stabilized and rising.   Disposition Plan & Communication   Status is: Inpatient  Remains inpatient appropriate because:Ongoing diagnostic testing needed not appropriate for outpatient work up.  Severity of illness with acute blood loss anemia requiring transfusions, monitoring Hbg another 24 hours and expect medically ready for discharge 1/31.   Dispo: The patient is from: SNF              Anticipated d/c is to: SNF              Anticipated d/c date is: 09/25/20              Patient currently is not medically stable to d/c.   Difficult to place patient No   Family Communication: spoke with son  and daughter-in-law by phone 1/28 afternoon.     Consults, Procedures, Significant Events   Consultants:   GI  Procedures:   None  Antimicrobials:  Anti-infectives (From admission, onward)   Start     Dose/Rate Route Frequency Ordered Stop   09/20/20 2100  vancomycin (VANCOREADY) IVPB 1250 mg/250 mL  Status:  Discontinued        1,250 mg 166.7 mL/hr over 90 Minutes Intravenous Every 48 hours 09/18/20 2127 09/20/20 1736   09/19/20 1900  ceFEPIme (MAXIPIME) 2 g in sodium chloride 0.9 % 100 mL IVPB  Status:  Discontinued        2 g 200 mL/hr over 30 Minutes Intravenous Every 24 hours 09/18/20 2131 09/20/20 1736   09/19/20 0400  metroNIDAZOLE (FLAGYL) IVPB 500 mg  Status:  Discontinued        500 mg 100 mL/hr over 60 Minutes Intravenous Every 8 hours 09/18/20 2116 09/20/20 1736   09/18/20 2130  vancomycin (VANCOREADY) IVPB 750 mg/150 mL        750 mg 150 mL/hr over 60 Minutes Intravenous  Once 09/18/20 2121 09/18/20 2309   09/18/20 1900  ceFEPIme  (MAXIPIME) 2 g in sodium chloride 0.9 % 100 mL IVPB        2 g 200 mL/hr over 30 Minutes Intravenous  Once 09/18/20 1854 09/18/20 1954   09/18/20 1900  metroNIDAZOLE (FLAGYL) IVPB 500 mg        500 mg 100 mL/hr over 60 Minutes Intravenous  Once 09/18/20 1854 09/18/20 2026   09/18/20 1900  vancomycin (VANCOCIN) IVPB 1000 mg/200 mL premix        1,000 mg 200 mL/hr over 60 Minutes Intravenous  Once 09/18/20 1854 09/18/20 2142        Objective   Vitals:   09/23/20 1952 09/23/20 2114 09/24/20 0435 09/24/20 0753  BP: (!) 170/59 (!) 157/59 (!) 147/93 (!) 168/72  Pulse: 75 82 67 74  Resp: 18  20 20   Temp: 97.8 F (36.6 C)  (!) 97.5 F (36.4 C) 98 F (36.7 C)  TempSrc:   Oral   SpO2: 90% 99% 100% 99%  Weight:      Height:       No intake or output data in the 24 hours ending 09/24/20 1351 Filed Weights   09/18/20 1647  Weight: 69.9 kg    Physical Exam:  General exam: awake, alert, no acute distress HEENT: moist mucus membranes, hearing grossly normal  Respiratory system: CTAB, normal respiratory effort, on room air. Cardiovascular system: RRR, no pedal edema.  Gastrointestinal system: little hard but not distended, hypoactive bowel sounds, non-tender. Central nervous system: CN's grossly intact, no gross focal deficits, normal speech Extremities: no edema, no cyanosis   Labs   Data Reviewed: I have personally reviewed following labs and imaging studies  CBC: Recent Labs  Lab 09/18/20 1655 09/19/20 0441 09/20/20 0520 09/20/20 1219 09/21/20 0545 09/21/20 1215 09/22/20 0610 09/23/20 0255 09/23/20 0818 09/23/20 1535 09/23/20 1959 09/24/20 0211 09/24/20 0743  WBC 26.5*   < > 20.9*  --  16.8*  --  13.9* 12.3*  --   --   --   --  12.5*  NEUTROABS 24.9*  --   --   --   --   --   --   --   --   --   --   --   --   HGB 6.0*   < > 7.8*   < > 7.2*   < >  6.3* 9.1* 9.6* 8.9* 8.7* 9.0* 9.5*  HCT 19.9*   < > 24.8*   < > 22.5*   < > 19.3* 28.2* 28.9* 27.1* 26.6* 28.3* 28.3*   MCV 90.0   < > 88.6  --  87.9  --  88.5 90.1  --   --   --   --  88.4  PLT 420*   < > 306  --  313  --  257 218  --   --   --   --  217   < > = values in this interval not displayed.   Basic Metabolic Panel: Recent Labs  Lab 09/19/20 0441 09/20/20 0520 09/21/20 0545 09/22/20 0610 09/23/20 0255 09/24/20 0743  NA 146* 148* 148* 150* 145 142  K 3.1* 4.7 4.3 3.8 4.3 4.4  CL 118* 122* 123* 123* 119* 114*  CO2 18* 18* 18* 19* 20* 20*  GLUCOSE 155* 85 115* 157* 119* 107*  BUN 67* 62* 59* 54* 45* 35*  CREATININE 1.61* 1.74* 1.92* 1.74* 1.71* 1.35*  CALCIUM 7.2* 8.1* 8.1* 8.2* 8.2* 8.1*  MG 2.2  --   --   --   --   --    GFR: Estimated Creatinine Clearance: 35.2 mL/min (A) (by C-G formula based on SCr of 1.35 mg/dL (H)). Liver Function Tests: Recent Labs  Lab 09/18/20 1655 09/20/20 0520  AST 38 32  ALT 17 16  ALKPHOS 39 42  BILITOT 0.6 0.5  PROT 5.9* 5.3*  ALBUMIN 2.2* 1.9*   No results for input(s): LIPASE, AMYLASE in the last 168 hours. No results for input(s): AMMONIA in the last 168 hours. Coagulation Profile: Recent Labs  Lab 09/18/20 1911 09/19/20 0441  INR 1.2 1.1   Cardiac Enzymes: No results for input(s): CKTOTAL, CKMB, CKMBINDEX, TROPONINI in the last 168 hours. BNP (last 3 results) No results for input(s): PROBNP in the last 8760 hours. HbA1C: No results for input(s): HGBA1C in the last 72 hours. CBG: Recent Labs  Lab 09/21/20 0730  GLUCAP 92   Lipid Profile: No results for input(s): CHOL, HDL, LDLCALC, TRIG, CHOLHDL, LDLDIRECT in the last 72 hours. Thyroid Function Tests: No results for input(s): TSH, T4TOTAL, FREET4, T3FREE, THYROIDAB in the last 72 hours. Anemia Panel: No results for input(s): VITAMINB12, FOLATE, FERRITIN, TIBC, IRON, RETICCTPCT in the last 72 hours. Sepsis Labs: Recent Labs  Lab 09/18/20 1655 09/18/20 1656 09/18/20 1911 09/19/20 0441 09/20/20 0520  PROCALCITON <0.10  --   --  0.17 0.15  LATICACIDVEN  --  2.9* 1.6  --    --     Recent Results (from the past 240 hour(s))  Urine culture     Status: None   Collection Time: 09/18/20  4:50 PM   Specimen: Urine, Random  Result Value Ref Range Status   Specimen Description   Final    URINE, RANDOM Performed at [facility], [address], [place], [place] [address]    Special Requests   Final    NONE Performed at [facility], [address], [place], [place] [address]    Culture   Final    NO GROWTH Performed at [facility], [address], [place], [place] [address]    Report Status 09/20/2020 FINAL  Final  Blood culture (routine x 2)     Status: None   Collection Time: 09/18/20  4:56 PM   Specimen: BLOOD  Result Value Ref Range Status   Specimen Description BLOOD LEFT ANTECUBITAL  Final   Special Requests   Final    BOTTLES DRAWN AEROBIC AND ANAEROBIC Blood Culture adequate volume   Culture   Final    NO GROWTH 5 DAYS Performed at [facility], [address] [place], [place] [address]    Report Status 09/23/2020 FINAL  Final  Blood culture (routine x 2)     Status: None   Collection Time: 09/18/20  5:01 PM   Specimen: BLOOD  Result Value Ref Range Status   Specimen Description BLOOD BLOOD RIGHT FOREARM  Final   Special Requests   Final    BOTTLES DRAWN AEROBIC AND ANAEROBIC Blood Culture adequate volume   Culture   Final    NO GROWTH 5 DAYS Performed at [facility], [address] [place], [place] [address]    Report Status 09/23/2020 FINAL  Final  Gastrointestinal Panel by PCR , Stool     Status: None   Collection Time: 09/18/20  5:06 PM   Specimen: Stool  Result Value Ref Range Status   Campylobacter species NOT DETECTED NOT DETECTED Final   Plesimonas shigelloides NOT DETECTED NOT DETECTED Final   Salmonella species NOT DETECTED NOT DETECTED Final   Yersinia enterocolitica NOT DETECTED NOT DETECTED Final   Vibrio species NOT DETECTED NOT DETECTED Final   Vibrio cholerae NOT  DETECTED NOT DETECTED Final   Enteroaggregative E coli (EAEC) NOT DETECTED NOT DETECTED Final   Enteropathogenic E coli (EPEC) NOT DETECTED NOT DETECTED Final   Enterotoxigenic E coli (ETEC) NOT DETECTED NOT DETECTED Final   Shiga like toxin producing E coli (STEC) NOT DETECTED NOT DETECTED Final   Shigella/Enteroinvasive E coli (EIEC) NOT DETECTED NOT DETECTED Final   Cryptosporidium NOT DETECTED NOT DETECTED Final   Cyclospora cayetanensis NOT DETECTED NOT DETECTED Final   Entamoeba histolytica NOT DETECTED NOT DETECTED Final   Giardia lamblia NOT DETECTED NOT DETECTED Final   Adenovirus F40/41 NOT DETECTED NOT DETECTED Final   Astrovirus NOT DETECTED NOT DETECTED Final   Norovirus GI/GII NOT DETECTED NOT DETECTED Final   Rotavirus A NOT DETECTED NOT DETECTED Final   Sapovirus (I, II, IV, and V) NOT DETECTED NOT DETECTED Final    Comment: Performed at [facility], [address] [place], [place] [address]  C Difficile Quick Screen (NO PCR Reflex)     Status: None   Collection Time: 09/18/20  5:07 PM   Specimen: Stool  Result Value Ref Range Status   C Diff antigen NEGATIVE NEGATIVE Final   C Diff toxin NEGATIVE NEGATIVE Final   C Diff interpretation No C. difficile detected.  Final    Comment: Performed at [facility], [address], [place], [place] [address]      Imaging Studies   No results found.   Medications   Scheduled Meds: . acidophilus  2 capsule Oral TID  . amLODipine  5 mg Oral Daily  . donepezil  5 mg Oral QHS  . pantoprazole  40 mg Intravenous Q12H  . polyethylene glycol  17 g Oral Daily  . senna-docusate  1 tablet Oral BID   Continuous Infusions:      LOS: 6 days    Time spent: 25 minutes with greater than 50% spent at bedside and in coordination of care.    Pennie Banter, DO [facility]  09/24/2020, 1:51 PM    If 7PM-7AM, please contact night-coverage. How to contact the [facility] Attending or Consulting  provider 7A - 7P or covering provider  during after hours 7P -7A, for this patient?    1. Check the care team in [facility] and look for a) attending/consulting TR[facility] provider listed and b) the [facility] team listed 2. Log into [email or web address] and use [place]'s universal password to access. If you do not have the password, please contact the hospital operator. 3. Locate the [facility] provider you are looking for under [facility] and page to a number that you can be directly reached. 4. If you still have difficulty reaching the provider, please page the [facility] (Director on Call) for the Hospitalists listed on amion for assistance.

## 2020-09-25 ENCOUNTER — Encounter: Payer: Self-pay | Admitting: Certified Registered"

## 2020-09-25 ENCOUNTER — Encounter
Admission: EM | Disposition: A | Discharge status: Hospice | Payer: Medicare Other | Source: Home / Self Care | Attending: Internal Medicine

## 2020-09-25 DIAGNOSIS — E870 Hyperosmolality and hypernatremia: Secondary | ICD-10-CM | POA: Diagnosis not present

## 2020-09-25 DIAGNOSIS — I10 Essential (primary) hypertension: Secondary | ICD-10-CM | POA: Diagnosis not present

## 2020-09-25 DIAGNOSIS — F0390 Unspecified dementia without behavioral disturbance: Secondary | ICD-10-CM | POA: Diagnosis not present

## 2020-09-25 DIAGNOSIS — D62 Acute posthemorrhagic anemia: Secondary | ICD-10-CM | POA: Diagnosis not present

## 2020-09-25 LAB — CBC
HCT: 26.5 % — ABNORMAL LOW (ref 39.0–52.0)
Hemoglobin: 8.6 g/dL — ABNORMAL LOW (ref 13.0–17.0)
MCH: 28.8 pg (ref 26.0–34.0)
MCHC: 32.5 g/dL (ref 30.0–36.0)
MCV: 88.6 fL (ref 80.0–100.0)
Platelets: 207 10*3/uL (ref 150–400)
RBC: 2.99 MIL/uL — ABNORMAL LOW (ref 4.22–5.81)
RDW: 17.5 % — ABNORMAL HIGH (ref 11.5–15.5)
WBC: 11 10*3/uL — ABNORMAL HIGH (ref 4.0–10.5)
nRBC: 0 % (ref 0.0–0.2)

## 2020-09-25 LAB — BASIC METABOLIC PANEL
Anion gap: 7 (ref 5–15)
BUN: 31 mg/dL — ABNORMAL HIGH (ref 8–23)
CO2: 18 mmol/L — ABNORMAL LOW (ref 22–32)
Calcium: 7.9 mg/dL — ABNORMAL LOW (ref 8.9–10.3)
Chloride: 115 mmol/L — ABNORMAL HIGH (ref 98–111)
Creatinine, Ser: 1.43 mg/dL — ABNORMAL HIGH (ref 0.61–1.24)
GFR, Estimated: 46 mL/min — ABNORMAL LOW (ref >60)
Glucose, Bld: 97 mg/dL (ref 70–99)
Potassium: 4.1 mmol/L (ref 3.5–5.1)
Sodium: 140 mmol/L (ref 135–145)

## 2020-09-25 LAB — HEMOGLOBIN AND HEMATOCRIT, BLOOD
HCT: 25.7 % — ABNORMAL LOW (ref 39.0–52.0)
Hemoglobin: 8.4 g/dL — ABNORMAL LOW (ref 13.0–17.0)

## 2020-09-25 SURGERY — EGD (ESOPHAGOGASTRODUODENOSCOPY)
Anesthesia: General

## 2020-09-25 MED ORDER — AMLODIPINE BESYLATE 10 MG PO TABS
10.0000 mg | ORAL_TABLET | Freq: Every day | ORAL | Status: DC
  Administered 2020-09-26: 10 mg via ORAL
  Filled 2020-09-25: qty 1

## 2020-09-25 NOTE — Progress Notes (Signed)
Received patient from 1C. Patient oriented to unit and room. Call bell within reach. Bed in lowest position with bed alarm set.

## 2020-09-25 NOTE — Progress Notes (Addendum)
Physical Therapy Treatment Patient Details Name: David [name] MRN: [id number] DOB: 1927/07/20 Today's Date: 09/25/2020    History of Present Illness David [name] is a 89yoF who comes [facility] from STR. Patient recently admitted from 09/06/2020-09/12/2020 for acute hypoxic respiratory failure due to COVID-19 pneumonia and CAP. He was also noted to have orthostatic hypotension while in the hospital. PMH: dementia, history of CVA, hypertension, recent admission for COVID-19 pneumonia.    PT Comments    Pt seen for PT tx with pt able to complete supine<>sit with max assist and tolerate sitting EOB ~4 minutes to perform BLE strengthening exercises but with min/mod assist to correct posterior lean. Pt then requests to return supine 2/2 fatigue. Pt is able to roll to allow PT to position pillow under hip to reduce pressure & assist with maintain sidelying to reduce chances of skin breakdown. Pt oriented to location with choice of two & improving ability to participate on this date. Continue to recommend STR upon d/c.     Follow Up Recommendations  SNF;Supervision for mobility/OOB;Supervision/Assistance - 24 hour     Equipment Recommendations       Recommendations for Other Services       Precautions / Restrictions Precautions Precautions: Fall Restrictions Weight Bearing Restrictions: No Other Position/Activity Restrictions: Orthostatic hypotension    Mobility  Bed Mobility Overal bed mobility: Needs Assistance Bed Mobility: Rolling;Supine to Sit;Sit to Supine Rolling: Min assist   Supine to sit: Max assist;HOB elevated Sit to supine: Max assist;HOB elevated      Transfers                    Ambulation/Gait                 Stairs             Wheelchair Mobility    Modified Rankin (Stroke Patients Only)       Balance Overall balance assessment: Needs assistance Sitting-balance support: Feet supported;Bilateral upper extremity supported Sitting  balance-Leahy Scale: Poor Sitting balance - Comments: posterior lean during BLE LAQ                                    Cognition Arousal/Alertness: Lethargic Behavior During Therapy: WFL for tasks assessed/performed Overall Cognitive Status: No family/caregiver present to determine baseline cognitive functioning                                        Exercises General Exercises - Lower Extremity Long Arc Quad: AROM;Strengthening;Right;Left;Both;10 reps;Seated (2 sets)    General Comments        Pertinent Vitals/Pain Pain Assessment: Faces Faces Pain Scale: No hurt    Home Living                      Prior Function            PT Goals (current goals can now be found in the care plan section) Acute Rehab PT Goals Patient Stated Goal: to feel better PT Goal Formulation: With patient Time For Goal Achievement: 10/09/20 Potential to Achieve Goals: Fair Progress towards PT goals: Progressing toward goals    Frequency    Min 2X/week      PT Plan Current plan remains appropriate    Co-evaluation  AM-PAC PT "6 Clicks" Mobility   Outcome Measure  Help needed turning from your back to your side while in a flat bed without using bedrails?: A Lot Help needed moving from lying on your back to sitting on the side of a flat bed without using bedrails?: A Lot Help needed moving to and from a bed to a chair (including a wheelchair)?: Total Help needed standing up from a chair using your arms (e.g., wheelchair or bedside chair)?: Total Help needed to walk in hospital room?: Total Help needed climbing 3-5 steps with a railing? : Total 6 Click Score: 8    End of Session   Activity Tolerance: Patient tolerated treatment well Patient left: in bed;with call bell/phone within reach;with bed alarm set Nurse Communication: Mobility status PT Visit Diagnosis: Unsteadiness on feet (R26.81);Difficulty in walking, not  elsewhere classified (R26.2);Muscle weakness (generalized) (M62.81);History of falling (Z91.81)     Time: 7616-0737 PT Time Calculation (min) (ACUTE ONLY): 12 min  Charges:  $Therapeutic Activity: 8-22 mins                     Aleda Grana, PT, DPT 09/25/20, 4:39 PM    Sandi Mariscal 09/25/2020, 4:38 PM

## 2020-09-25 NOTE — [place] FL2 (Signed)
[place] MEDICAID FL2 LEVEL OF CARE SCREENING TOOL     IDENTIFICATION  Patient Name: David [name] Birthdate: [birth year] Sex: male Admission Date (Current Location): 09/18/2020  [place] and [place] Number:  Chiropodist and Address:  [facility], [address] [place], [place] [address]      Provider Number: [id number]  Attending Physician Name and Address:  Pennie Banter, DO  Relative Name and Phone Number:       Current Level of Care: Hospital Recommended Level of Care: Skilled Nursing Facility Prior Approval Number:    Date Approved/Denied:   PASRR Number: [id number]  Discharge Plan: SNF    Current Diagnoses: Patient Active Problem List   Diagnosis Date Noted  . Acute blood loss anemia 09/18/2020  . Dementia (HCC)   . Essential hypertension   . History of CVA (cerebrovascular accident)   . Hypernatremia   . Systemic inflammatory response syndrome (SIRS) associated with organ dysfunction (HCC)   . AKI (acute kidney injury) (HCC)   . Pneumonia due to COVID-19 virus 09/06/2020  . Acute hypoxemic respiratory failure (HCC) 09/06/2020  . Bilateral carotid artery stenosis 06/11/2017  . Dizziness 08/08/2016  . Syncopal episodes 08/08/2016    Orientation RESPIRATION BLADDER Height & Weight     Self  Normal Incontinent,External catheter Weight: 154 lb (69.9 kg) Height:  5\' 9"  (175.3 cm)  BEHAVIORAL SYMPTOMS/MOOD NEUROLOGICAL BOWEL NUTRITION STATUS   (None)  (Dementia, History of CVA.) Continent Diet (DYS 2)  AMBULATORY STATUS COMMUNICATION OF NEEDS Skin   Extensive Assist Verbally Normal                       Personal Care Assistance Level of Assistance  Bathing,Feeding,Dressing Bathing Assistance: Maximum assistance Feeding assistance: Limited assistance Dressing Assistance: Maximum assistance     Functional Limitations Info  Sight,Hearing,Speech Sight Info: Adequate Hearing Info: Adequate Speech Info:  Adequate    SPECIAL CARE FACTORS FREQUENCY  PT (By licensed PT),OT (By licensed OT)     PT Frequency: 5 x week OT Frequency: 5 x week            Contractures Contractures Info: Not present    Additional Factors Info  Code Status,Allergies,Isolation Precautions Code Status Info: DNR Allergies Info: NKDA     Isolation Precautions Info: COVID - Tested positive 1/12.     Current Medications (09/25/2020):  This is the current hospital active medication list Current Facility-Administered Medications  Medication Dose Route Frequency Provider Last Rate Last Admin  . acidophilus (RISAQUAD) capsule 2 capsule  2 capsule Oral TID 09/27/2020, RPH   2 capsule at 09/24/20 2046  . amLODipine (NORVASC) tablet 5 mg  5 mg Oral Daily 2047 A, DO   5 mg at 09/24/20 1157  . bisacodyl (DULCOLAX) EC tablet 5 mg  5 mg Oral Daily PRN 09/26/20 A, DO      . donepezil (ARICEPT) tablet 5 mg  5 mg Oral QHS Esaw Grandchild A, DO   5 mg at 09/24/20 2046  . hydrALAZINE (APRESOLINE) tablet 50 mg  50 mg Oral Q6H PRN Mansy, Jan A, MD   50 mg at 09/24/20 1754  . ondansetron (ZOFRAN) tablet 4 mg  4 mg Oral Q6H PRN 09/26/20, MD       Or  . ondansetron (ZOFRAN) injection 4 mg  4 mg Intravenous Q6H PRN Charlsie Quest R, MD      . pantoprazole (PROTONIX) injection 40  mg  40 mg Intravenous Q12H Concha Se, MD   40 mg at 09/25/20 0930  . polyethylene glycol (MIRALAX / GLYCOLAX) packet 17 g  17 g Oral Daily Esaw Grandchild A, DO   17 g at 09/24/20 1157  . senna-docusate (Senokot-S) tablet 1 tablet  1 tablet Oral BID Pennie Banter, DO   1 tablet at 09/24/20 2047     Discharge Medications: Please see discharge summary for a list of discharge medications.  Relevant Imaging Results:  Relevant Lab Results:   Additional Information SS#: [id number]  Margarito Liner, LCSW

## 2020-09-25 NOTE — Progress Notes (Signed)
PROGRESS NOTE    David [name]   VFI:[id number]  DOB: 15-Nov-1926  PCP: [facility]    DOA: 09/18/2020 LOS: 7   Brief Narrative   David [name] is a 87 y.o. male with medical history significant for dementia, history of CVA, hypertension, recent admission for COVID-19 pneumonia who presents to the ED from SNF for evaluation of shortness of breath and diarrhea.  History is limited from patient due to underlying dementia and is otherwise supplemented by EDP, chart review, and patient's son by phone.   Patient recently admitted from 09/06/2020-09/12/2020 for acute hypoxic respiratory failure due to COVID-19 pneumonia and CAP.  Patient was treated with IV remdesivir, steroids, IV ceftriaxone/azithromycin.  He was discharged on 2 L of home O2 via [place] to SNF.  He was also noted to have orthostatic hypotension while in the hospital.   Patient returns to the ED from SNF due to reported shortness of breath and loose stools. Per ED triage notes patient was brought in via EMS on 100% NRB but switched to 3 L supplemental O2 via [place] on arrival. Patient complains of diarrhea and abdominal pain but is not able to provide further history.     Labs notable for BUN 76, creatinine 1.88 (1.08 on 09/11/2020), hemoglobin 6.0 (12.0 on 09/10/2020), WBC 26.5, hs-troponin 43, lactic acid 2.9 > 1.6. C. difficile and GI pathogen panels both negative.   Chest x-ray with interval improvement in bilateral pulmonary opacities from prior.   CTA chest/abdomen/pelvis obtained.  Negative for evidence of PE.  Bilateral groundglass opacities seen consistent with recent COVID-19 pneumonia.  Mild bilateral hydronephroureter and a 7 mm nonobstructing left renal interpolar calculus seen.  Thickened appearance of the colon seen without evidence of bowel obstruction.  Large hiatal hernia also noted.   Per EDP, melena present on rectal exam which is Hemoccult positive.  Patient was started on broad-spectrum empiric antibiotics  with IV vancomycin, cefepime and Flagyl.  Patient was given IV Protonix bolus and started on continuous infusion.  Patient received 1 L normal saline.  Order for transfuse 2 units PRBCs placed. EDP discussed with on-call GI who is aware of patient. The hospitalist service was consulted to admit for further evaluation and management.     Assessment & Plan   Principal Problem:   Acute blood loss anemia Active Problems:   Pneumonia due to COVID-19 virus   Dementia (HCC)   Essential hypertension   History of CVA (cerebrovascular accident)   Hypernatremia   Systemic inflammatory response syndrome (SIRS) associated with organ dysfunction (HCC)   AKI (acute kidney injury) (HCC)   Symptomatic acute blood loss anemia due to GI bleed - POA with Hbg 6.0, down from 12.0 at recent discharge.  With melena and hemoccult positive stool in ED.   Transfused 2 units PRBCs on admission, 2 units 1/28. GI consulted, input appreciated. Family want to monitor this weekend, tentative plan for EGD Monday if he continues to bleed. Hemodynamically stable.   1/31: Hbg 8.6 - has remained stable in mid-8 to mid-9 range since last transfusion -- Repeat H&H this afternoon & in AM -- IV Protonix BID -- Monitor H&H's, transfuse if Hg 7 or less -- Maintain 2 large bore iv line -- Page GI if acute hemodynamic changes or s/sx of active GIB -- Hold home aspirin and pharmacologic VTE prophylaxis  Recent admission for COVID-19 pneumonia - SARS-CoV-2 PCR + 09/06/2020. Was admitted and discharged 09/12/2020, had completed remdesivir, steroids, ceftriaxone/azithromycin for secondary  bacterial pneumonia. He was requiring 2 L O2 via [place] on discharge. This admission, initially was on 1.5-2 L [place] oxygen, has now been weaned to room air. --Transfer off Covid Unit  -- May have visitor  SIRS - POA with tachypnea, leukocytosis, lactic acidosis on arrival without any infectious source identified.  Pulmonary infiltrates on xray are  related to recent Covid pneumonia. C. difficile and GI pathogen panels are negative.  Was initially covered empirically with broad spectrum antibiotics (vanc, cefepime, and flagyl). --antibiotics were stopped and patient remains clinically stable, vitals improved --urine culture negative --blood cx neg --continue to monitor  Acute kidney injury - POA.  Improving. Due to prerenal azotemia in setting of GI bleeding and hypovolemia. Improving with IV hydration.  Hold NSAIDS and ACEI.  Avoid nephrotoxins and hypotension.   --Follow daily BMP's --on IV fluids, reassess need daily & Monitor volume status  Hypokalemia - replaced.  Monitor BMP and replace as needed.  Hypernatremia - Resolved with d5w. Na 145 today. --Off D5W --Nursing: encourage pt to drink water, have water available in his reach on bedside table at all times --Monitor BMP  Constipation - abdomen hard, hx of severe constipation.  Bowel regimen, hold if loose stools or frequent BM's.  Diarrhea RESOLVED - due to melena / GI bleeding.  Resolved. C. difficile and GI pathogen panels are negative. Viral gastroenteritis also possible.  Continue supportive care.  Hypertension - chronic, stable. Resume home amlodipine.  Continue to hold lisinopril so as not to over-treat; still some risk for hypotension if bleeding recurs.  Also low diastolic BP's.  History of CVA - Hold aspirin with ongoing GI bleed.  Resume once Hbg stable and improving.  Dementia - Chronic, no major behavioral disturbances.   Appears declining since his wife passed away per son.  --Continue donepezil     DVT prophylaxis: SCDs Start: 09/18/20 2102   Diet:  Diet Orders (From admission, onward)    Start     Ordered   09/25/20 1115  DIET DYS 2 Room service appropriate? Yes; Fluid consistency: Thin  Diet effective now       Question Answer Comment  Room service appropriate? Yes   Fluid consistency: Thin      09/25/20 1114            Code  Status: DNR    Subjective 09/25/20    Patient awake in bed today.  Reports he is thirsty and really hungry.  EGD was cancelled as Hbg fairly stable, no can take off NPO and let him eat.  He was happy to hear that and smiled.  He wants to get out of the bed.chair   Disposition Plan & Communication   Status is: Inpatient  Remains inpatient appropriate because: requires SNF placement for rehab.   Dispo: The patient is from: SNF              Anticipated d/c is to: SNF              Anticipated d/c date is: 09/25/20              Patient currently IS medically stable for d/c.   Difficult to place patient No   Family Communication: spoke with son and daughter-in-law by phone 1/28 afternoon.     Consults, Procedures, Significant Events   Consultants:   GI  Procedures:   None  Antimicrobials:  Anti-infectives (From admission, onward)   Start     Dose/Rate Route Frequency Ordered Stop  09/20/20 2100  vancomycin (VANCOREADY) IVPB 1250 mg/250 mL  Status:  Discontinued        1,250 mg 166.7 mL/hr over 90 Minutes Intravenous Every 48 hours 09/18/20 2127 09/20/20 1736   09/19/20 1900  ceFEPIme (MAXIPIME) 2 g in sodium chloride 0.9 % 100 mL IVPB  Status:  Discontinued        2 g 200 mL/hr over 30 Minutes Intravenous Every 24 hours 09/18/20 2131 09/20/20 1736   09/19/20 0400  metroNIDAZOLE (FLAGYL) IVPB 500 mg  Status:  Discontinued        500 mg 100 mL/hr over 60 Minutes Intravenous Every 8 hours 09/18/20 2116 09/20/20 1736   09/18/20 2130  vancomycin (VANCOREADY) IVPB 750 mg/150 mL        750 mg 150 mL/hr over 60 Minutes Intravenous  Once 09/18/20 2121 09/18/20 2309   09/18/20 1900  ceFEPIme (MAXIPIME) 2 g in sodium chloride 0.9 % 100 mL IVPB        2 g 200 mL/hr over 30 Minutes Intravenous  Once 09/18/20 1854 09/18/20 1954   09/18/20 1900  metroNIDAZOLE (FLAGYL) IVPB 500 mg        500 mg 100 mL/hr over 60 Minutes Intravenous  Once 09/18/20 1854 09/18/20 2026   09/18/20 1900   vancomycin (VANCOCIN) IVPB 1000 mg/200 mL premix        1,000 mg 200 mL/hr over 60 Minutes Intravenous  Once 09/18/20 1854 09/18/20 2142        Objective   Vitals:   09/24/20 2356 09/25/20 0432 09/25/20 0751 09/25/20 1134  BP: (!) 144/75 (!) 133/91 (!) 145/102 (!) 164/59  Pulse: 76 72 67 71  Resp: Temp: 97.8 F (36.6 C) 98 F (36.7 C) 97.6 F (36.4 C) (!) 97.3 F (36.3 C)  TempSrc:   Oral   SpO2: 97% 97% 96% 100%  Weight:      Height:       No intake or output data in the 24 hours ending 09/25/20 1709 Filed Weights   09/18/20 1647  Weight: 69.9 kg    Physical Exam:  General exam: awake, alert, no acute distress HEENT: dry mucus membranes, hearing grossly normal  Respiratory system: CTAB, normal respiratory effort, on room air. Cardiovascular system: RRR, no pedal edema.  Gastrointestinal system: soft, NT, +bowel sounds Central nervous system: CN's grossly intact, no gross focal deficits, normal speech   Labs   Data Reviewed: I have personally reviewed following labs and imaging studies  CBC: Recent Labs  Lab 09/21/20 0545 09/21/20 1215 09/22/20 0610 09/23/20 0255 09/23/20 0818 09/23/20 1535 09/23/20 1959 09/24/20 0211 09/24/20 0743 09/25/20 0552  WBC 16.8*  --  13.9* 12.3*  --   --   --   --  12.5* 11.0*  HGB 7.2*   < > 6.3* 9.1*   < > 8.9* 8.7* 9.0* 9.5* 8.6*  HCT 22.5*   < > 19.3* 28.2*   < > 27.1* 26.6* 28.3* 28.3* 26.5*  MCV 87.9  --  88.5 90.1  --   --   --   --  88.4 88.6  PLT 313  --  257 218  --   --   --   --  217 207   < > = values in this interval not displayed.   Basic Metabolic Panel: Recent Labs  Lab 09/19/20 0441 09/20/20 0520 09/21/20 0545 09/22/20 0610 09/23/20 0255 09/24/20 0743 09/25/20 0552  NA 146*   < > 148*  150* 145 142 140  K 3.1*   < > 4.3 3.8 4.3 4.4 4.1  CL 118*   < > 123* 123* 119* 114* 115*  CO2 18*   < > 18* 19* 20* 20* 18*  GLUCOSE 155*   < > 115* 157* 119* 107* 97  BUN 67*   < > 59* 54* 45* 35*  31*  CREATININE 1.61*   < > 1.92* 1.74* 1.71* 1.35* 1.43*  CALCIUM 7.2*   < > 8.1* 8.2* 8.2* 8.1* 7.9*  MG 2.2  --   --   --   --   --   --    < > = values in this interval not displayed.   GFR: Estimated Creatinine Clearance: 33.3 mL/min (A) (by C-G formula based on SCr of 1.43 mg/dL (H)). Liver Function Tests: Recent Labs  Lab 09/20/20 0520  AST 32  ALT 16  ALKPHOS 42  BILITOT 0.5  PROT 5.3*  ALBUMIN 1.9*   No results for input(s): LIPASE, AMYLASE in the last 168 hours. No results for input(s): AMMONIA in the last 168 hours. Coagulation Profile: Recent Labs  Lab 09/18/20 1911 09/19/20 0441  INR 1.2 1.1   Cardiac Enzymes: No results for input(s): CKTOTAL, CKMB, CKMBINDEX, TROPONINI in the last 168 hours. BNP (last 3 results) No results for input(s): PROBNP in the last 8760 hours. HbA1C: No results for input(s): HGBA1C in the last 72 hours. CBG: Recent Labs  Lab 09/21/20 0730  GLUCAP 92   Lipid Profile: No results for input(s): CHOL, HDL, LDLCALC, TRIG, CHOLHDL, LDLDIRECT in the last 72 hours. Thyroid Function Tests: No results for input(s): TSH, T4TOTAL, FREET4, T3FREE, THYROIDAB in the last 72 hours. Anemia Panel: No results for input(s): VITAMINB12, FOLATE, FERRITIN, TIBC, IRON, RETICCTPCT in the last 72 hours. Sepsis Labs: Recent Labs  Lab 09/18/20 1911 09/19/20 0441 09/20/20 0520  PROCALCITON  --  0.17 0.15  LATICACIDVEN 1.6  --   --     Recent Results (from the past 240 hour(s))  Urine culture     Status: None   Collection Time: 09/18/20  4:50 PM   Specimen: Urine, Random  Result Value Ref Range Status   Specimen Description   Final    URINE, RANDOM Performed at [facility], [address] [place], [place] [address]    Special Requests   Final    NONE Performed at [facility], [address] [place], [place] [address]    Culture   Final    NO GROWTH Performed at [facility], [address], [place],  [place] [address]    Report Status 09/20/2020 FINAL  Final  Blood culture (routine x 2)     Status: None   Collection Time: 09/18/20  4:56 PM   Specimen: BLOOD  Result Value Ref Range Status   Specimen Description BLOOD LEFT ANTECUBITAL  Final   Special Requests   Final    BOTTLES DRAWN AEROBIC AND ANAEROBIC Blood Culture adequate volume   Culture   Final    NO GROWTH 5 DAYS Performed at [facility], [address], [place], [place] [address]    Report Status 09/23/2020 FINAL  Final  Blood culture (routine x 2)     Status: None   Collection Time: 09/18/20  5:01 PM   Specimen: BLOOD  Result Value Ref Range Status   Specimen Description BLOOD BLOOD RIGHT FOREARM  Final   Special Requests   Final  BOTTLES DRAWN AEROBIC AND ANAEROBIC Blood Culture adequate volume   Culture   Final    NO GROWTH 5 DAYS Performed at [facility], [address] [place], [place] [address]    Report Status 09/23/2020 FINAL  Final  Gastrointestinal Panel by PCR , Stool     Status: None   Collection Time: 09/18/20  5:06 PM   Specimen: Stool  Result Value Ref Range Status   Campylobacter species NOT DETECTED NOT DETECTED Final   Plesimonas shigelloides NOT DETECTED NOT DETECTED Final   Salmonella species NOT DETECTED NOT DETECTED Final   Yersinia enterocolitica NOT DETECTED NOT DETECTED Final   Vibrio species NOT DETECTED NOT DETECTED Final   Vibrio cholerae NOT DETECTED NOT DETECTED Final   Enteroaggregative E coli (EAEC) NOT DETECTED NOT DETECTED Final   Enteropathogenic E coli (EPEC) NOT DETECTED NOT DETECTED Final   Enterotoxigenic E coli (ETEC) NOT DETECTED NOT DETECTED Final   Shiga like toxin producing E coli (STEC) NOT DETECTED NOT DETECTED Final   Shigella/Enteroinvasive E coli (EIEC) NOT DETECTED NOT DETECTED Final   Cryptosporidium NOT DETECTED NOT DETECTED Final   Cyclospora cayetanensis NOT DETECTED NOT DETECTED Final   Entamoeba histolytica NOT DETECTED NOT DETECTED Final    Giardia lamblia NOT DETECTED NOT DETECTED Final   Adenovirus F40/41 NOT DETECTED NOT DETECTED Final   Astrovirus NOT DETECTED NOT DETECTED Final   Norovirus GI/GII NOT DETECTED NOT DETECTED Final   Rotavirus A NOT DETECTED NOT DETECTED Final   Sapovirus (I, II, IV, and V) NOT DETECTED NOT DETECTED Final    Comment: Performed at [facility], [address] [place], [place] [address]  C Difficile Quick Screen (NO PCR Reflex)     Status: None   Collection Time: 09/18/20  5:07 PM   Specimen: Stool  Result Value Ref Range Status   C Diff antigen NEGATIVE NEGATIVE Final   C Diff toxin NEGATIVE NEGATIVE Final   C Diff interpretation No C. difficile detected.  Final    Comment: Performed at [facility], [address] [place], [place] [address]      Imaging Studies   No results found.   Medications   Scheduled Meds: . acidophilus  2 capsule Oral TID  . [START ON 09/26/2020] amLODipine  10 mg Oral Daily  . donepezil  5 mg Oral QHS  . pantoprazole  40 mg Intravenous Q12H  . polyethylene glycol  17 g Oral Daily  . senna-docusate  1 tablet Oral BID   Continuous Infusions:      LOS: 7 days    Time spent: 25 minutes with greater than 50% spent at bedside and in coordination of care.    Pennie Banter, DO [facility]  09/25/2020, 5:09 PM    If 7PM-7AM, please contact night-coverage. How to contact the [facility] Attending or Consulting provider 7A - 7P or covering provider during after hours 7P -7A, for this patient?    1. Check the care team in [facility] and look for a) attending/consulting TR[facility] provider listed and b) the [facility] team listed 2. Log into [email or web address] and use [place]'s universal password to access. If you do not have the password, please contact the hospital operator. 3. Locate the [facility] provider you are looking for under [facility] and page to a number that you can be directly reached. 4. If you still have difficulty reaching the  provider, please page the [facility] (Director on Call) for the Hospitalists listed on amion  for assistance.

## 2020-09-25 NOTE — Progress Notes (Signed)
The patient hemoglobin has fluctuated over the last few days without any significant change.  I have spoken to the patient's family about his present condition and they have agreed to continue conservative therapy.  The patient is high risk for any intervention and I have told him that if there is any further sign of a correctable cause for his bleeding then we can reassess the need for an upper endoscopy.  The patients family agrees with the plan.

## 2020-09-26 ENCOUNTER — Inpatient Hospital Stay: Payer: Medicare Other

## 2020-09-26 DIAGNOSIS — F0390 Unspecified dementia without behavioral disturbance: Secondary | ICD-10-CM | POA: Diagnosis not present

## 2020-09-26 DIAGNOSIS — D62 Acute posthemorrhagic anemia: Secondary | ICD-10-CM | POA: Diagnosis not present

## 2020-09-26 DIAGNOSIS — N179 Acute kidney failure, unspecified: Secondary | ICD-10-CM | POA: Diagnosis not present

## 2020-09-26 DIAGNOSIS — I10 Essential (primary) hypertension: Secondary | ICD-10-CM | POA: Diagnosis not present

## 2020-09-26 LAB — VITAMIN B12: Vitamin B-12: 228 pg/mL (ref 180–914)

## 2020-09-26 LAB — CBC
HCT: 23.4 % — ABNORMAL LOW (ref 39.0–52.0)
Hemoglobin: 7.8 g/dL — ABNORMAL LOW (ref 13.0–17.0)
MCH: 29.8 pg (ref 26.0–34.0)
MCHC: 33.3 g/dL (ref 30.0–36.0)
MCV: 89.3 fL (ref 80.0–100.0)
Platelets: 177 10*3/uL (ref 150–400)
RBC: 2.62 MIL/uL — ABNORMAL LOW (ref 4.22–5.81)
RDW: 17.6 % — ABNORMAL HIGH (ref 11.5–15.5)
WBC: 9.7 10*3/uL (ref 4.0–10.5)
nRBC: 0 % (ref 0.0–0.2)

## 2020-09-26 LAB — IRON AND TIBC
Iron: 8 ug/dL — ABNORMAL LOW (ref 45–182)
Saturation Ratios: 6 % — ABNORMAL LOW (ref 17.9–39.5)
TIBC: 130 ug/dL — ABNORMAL LOW (ref 250–450)
UIBC: 122 ug/dL

## 2020-09-26 LAB — BASIC METABOLIC PANEL
Anion gap: 8 (ref 5–15)
BUN: 44 mg/dL — ABNORMAL HIGH (ref 8–23)
CO2: 20 mmol/L — ABNORMAL LOW (ref 22–32)
Calcium: 7.7 mg/dL — ABNORMAL LOW (ref 8.9–10.3)
Chloride: 115 mmol/L — ABNORMAL HIGH (ref 98–111)
Creatinine, Ser: 2.59 mg/dL — ABNORMAL HIGH (ref 0.61–1.24)
GFR, Estimated: 23 mL/min — ABNORMAL LOW (ref >60)
Glucose, Bld: 94 mg/dL (ref 70–99)
Potassium: 4.2 mmol/L (ref 3.5–5.1)
Sodium: 143 mmol/L (ref 135–145)

## 2020-09-26 LAB — FERRITIN: Ferritin: 205 ng/mL (ref 24–336)

## 2020-09-26 LAB — FOLATE: Folate: 7.8 ng/mL (ref >5.9)

## 2020-09-26 MED ORDER — TIMOLOL MALEATE 0.5 % OP SOLN
1.0000 [drp] | Freq: Every day | OPHTHALMIC | Status: DC
  Administered 2020-09-26 – 2020-09-29 (×4): 1 [drp] via OPHTHALMIC
  Filled 2020-09-26: qty 5

## 2020-09-26 MED ORDER — SODIUM CHLORIDE 0.9 % IV SOLN: INTRAVENOUS | Status: DC

## 2020-09-26 MED ORDER — CHLORHEXIDINE GLUCONATE CLOTH 2 % EX PADS: 6.0000 | MEDICATED_PAD | Freq: Every day | CUTANEOUS | Status: DC

## 2020-09-26 MED ORDER — CHLORHEXIDINE GLUCONATE CLOTH 2 % EX PADS
6.0000 | MEDICATED_PAD | Freq: Every day | CUTANEOUS | Status: DC
Start: 2020-09-27 — End: 2020-09-29
  Administered 2020-09-27 – 2020-09-29 (×3): 6 via TOPICAL

## 2020-09-26 MED ORDER — TAMSULOSIN HCL 0.4 MG PO CAPS
0.4000 mg | ORAL_CAPSULE | Freq: Every day | ORAL | Status: DC
  Administered 2020-09-26 – 2020-09-29 (×4): 0.4 mg via ORAL
  Filled 2020-09-26 (×4): qty 1

## 2020-09-26 MED ORDER — LATANOPROST 0.005 % OP SOLN
1.0000 [drp] | Freq: Every day | OPHTHALMIC | Status: DC
  Administered 2020-09-26 – 2020-09-28 (×3): 1 [drp] via OPHTHALMIC
  Filled 2020-09-26: qty 2.5

## 2020-09-26 NOTE — Progress Notes (Signed)
David Minium, MD [facility]   [address], [address] [place], [place] [address] Phone: [phone] Fax : [phone]   Subjective: The patient is pleasantly confused and has no complaints today.  I spoke to the nurse who reports the patient not having any sign of any GI bleeding she denies any report of any black stools or bloody stools.  Patient's hemoglobin was slightly lower today at 7.8 down from 8.4 yesterday.  The patient appears to have finished his entire meal with his tray being empty this morning.   Objective: Vital signs in last 24 hours: Vitals:   09/25/20 1808 09/25/20 1952 09/25/20 2311 09/26/20 0545  BP: (!) 144/72 (!) 126/54 124/70 134/62  Pulse: 91 82 72 73  Resp: 19 20 16 20   Temp: 98 F (36.7 C) 97.7 F (36.5 C) 98.3 F (36.8 C) 97.7 F (36.5 C)  TempSrc: Oral Oral Oral Oral  SpO2: 97% 97% 98% 97%  Weight:      Height:       Weight change:   Intake/Output Summary (Last 24 hours) at 09/26/2020 1000 Last data filed at 09/25/2020 2025 Gross per 24 hour  Intake 0 ml  Output --  Net 0 ml     Exam: Heart:: Regular rate and rhythm, S1S2 present or without murmur or extra heart sounds Lungs: normal and clear to auscultation and percussion Abdomen: soft, nontender, normal bowel sounds   Lab Results: @LABTEST2 @ Micro Results: Recent Results (from the past 240 hour(s))  Urine culture     Status: None   Collection Time: 09/18/20  4:50 PM   Specimen: Urine, Random  Result Value Ref Range Status   Specimen Description   Final    URINE, RANDOM Performed at [facility], [address] [place], [address] [place]    Special Requests   Final    NONE Performed at [facility], [address], [place], [address] [place]    Culture   Final    NO GROWTH Performed at [facility], [address] [id number], [place], [address] [place]    Report Status 09/20/2020 FINAL  Final  Blood culture (routine x 2)     Status: None   Collection Time:  09/18/20  4:56 PM   Specimen: BLOOD  Result Value Ref Range Status   Specimen Description BLOOD LEFT ANTECUBITAL  Final   Special Requests   Final    BOTTLES DRAWN AEROBIC AND ANAEROBIC Blood Culture adequate volume   Culture   Final    NO GROWTH 5 DAYS Performed at [facility], [address] [place], [address] [place]    Report Status 09/23/2020 FINAL  Final  Blood culture (routine x 2)     Status: None   Collection Time: 09/18/20  5:01 PM   Specimen: BLOOD  Result Value Ref Range Status   Specimen Description BLOOD BLOOD RIGHT FOREARM  Final   Special Requests   Final    BOTTLES DRAWN AEROBIC AND ANAEROBIC Blood Culture adequate volume   Culture   Final    NO GROWTH 5 DAYS Performed at [facility], [address], [place], [address] [place]    Report Status 09/23/2020 FINAL  Final  Gastrointestinal Panel by PCR , Stool     Status: None   Collection Time: 09/18/20  5:06 PM   Specimen: Stool  Result Value Ref Range Status   Campylobacter species NOT DETECTED NOT DETECTED Final   Plesimonas shigelloides NOT DETECTED NOT DETECTED Final  Salmonella species NOT DETECTED NOT DETECTED Final   Yersinia enterocolitica NOT DETECTED NOT DETECTED Final   Vibrio species NOT DETECTED NOT DETECTED Final   Vibrio cholerae NOT DETECTED NOT DETECTED Final   Enteroaggregative E coli (EAEC) NOT DETECTED NOT DETECTED Final   Enteropathogenic E coli (EPEC) NOT DETECTED NOT DETECTED Final   Enterotoxigenic E coli (ETEC) NOT DETECTED NOT DETECTED Final   Shiga like toxin producing E coli (STEC) NOT DETECTED NOT DETECTED Final   Shigella/Enteroinvasive E coli (EIEC) NOT DETECTED NOT DETECTED Final   Cryptosporidium NOT DETECTED NOT DETECTED Final   Cyclospora cayetanensis NOT DETECTED NOT DETECTED Final   Entamoeba histolytica NOT DETECTED NOT DETECTED Final   Giardia lamblia NOT DETECTED NOT DETECTED Final   Adenovirus F40/41 NOT DETECTED NOT DETECTED Final   Astrovirus  NOT DETECTED NOT DETECTED Final   Norovirus GI/GII NOT DETECTED NOT DETECTED Final   Rotavirus A NOT DETECTED NOT DETECTED Final   Sapovirus (I, II, IV, and V) NOT DETECTED NOT DETECTED Final    Comment: Performed at [facility], [address] [place], [place] [address]  C Difficile Quick Screen (NO PCR Reflex)     Status: None   Collection Time: 09/18/20  5:07 PM   Specimen: Stool  Result Value Ref Range Status   C Diff antigen NEGATIVE NEGATIVE Final   C Diff toxin NEGATIVE NEGATIVE Final   C Diff interpretation No C. difficile detected.  Final    Comment: Performed at [facility], [address] [place], [place] [address]   Studies/Results: No results found. Medications: I have reviewed the patient's current medications. Scheduled Meds: . acidophilus  2 capsule Oral TID  . amLODipine  10 mg Oral Daily  . donepezil  5 mg Oral QHS  . latanoprost  1 drop Both Eyes QHS  . pantoprazole  40 mg Intravenous Q12H  . polyethylene glycol  17 g Oral Daily  . senna-docusate  1 tablet Oral BID  . timolol  1 drop Both Eyes Daily   Continuous Infusions: PRN Meds:.bisacodyl, hydrALAZINE, ondansetron **OR** ondansetron (ZOFRAN) IV   Assessment: Principal Problem:   Acute blood loss anemia Active Problems:   Pneumonia due to COVID-19 virus   Dementia (HCC)   Essential hypertension   History of CVA (cerebrovascular accident)   Hypernatremia   Systemic inflammatory response syndrome (SIRS) associated with organ dysfunction (HCC)   AKI (acute kidney injury) (HCC)    Plan: The patient is feeling well this morning without any complaints.  The patient's hemoglobin has dropped somewhat over the last 2 days.  There has been no sign of any black stools or bloody stools or other source of his GI bleed.  I have spoken to the family and conservative therapy has been elected for this 87 year old pleasantly demented patient.  If his hemoglobin continues to drop I will contact  them again to review our options on whether to proceed with any further invasive intervention.   LOS: 8 days   David [name], Clementeen Graham 09/26/2020, 10:00 AM Pager [phone] 7am-5pm  Check AMION for 5pm -7am coverage and on weekends

## 2020-09-26 NOTE — Progress Notes (Addendum)
PROGRESS NOTE    David [name]   JXB:[id number]  DOB: 04/27/1927  PCP: [facility]    DOA: 09/18/2020 LOS: 8   Brief Narrative   David [name] is a 87 y.o. male with medical history significant for dementia, history of CVA, hypertension, recent admission for COVID-19 pneumonia who presents to the ED from SNF for evaluation of shortness of breath and diarrhea.  History is limited from patient due to underlying dementia and is otherwise supplemented by EDP, chart review, and patient's son by phone.   Patient recently admitted from 09/06/2020-09/12/2020 for acute hypoxic respiratory failure due to COVID-19 pneumonia and CAP.  Patient was treated with IV remdesivir, steroids, IV ceftriaxone/azithromycin.  He was discharged on 2 L of home O2 via [place] to SNF.  He was also noted to have orthostatic hypotension while in the hospital.   Patient returns to the ED from SNF due to reported shortness of breath and loose stools. Per ED triage notes patient was brought in via EMS on 100% NRB but switched to 3 L supplemental O2 via [place] on arrival. Patient complains of diarrhea and abdominal pain but is not able to provide further history.     Labs notable for BUN 76, creatinine 1.88 (1.08 on 09/11/2020), hemoglobin 6.0 (12.0 on 09/10/2020), WBC 26.5, hs-troponin 43, lactic acid 2.9 > 1.6. C. difficile and GI pathogen panels both negative.   Chest x-ray with interval improvement in bilateral pulmonary opacities from prior.   CTA chest/abdomen/pelvis obtained.  Negative for evidence of PE.  Bilateral groundglass opacities seen consistent with recent COVID-19 pneumonia.  Mild bilateral hydronephroureter and a 7 mm nonobstructing left renal interpolar calculus seen.  Thickened appearance of the colon seen without evidence of bowel obstruction.  Large hiatal hernia also noted.   Per EDP, melena present on rectal exam which is Hemoccult positive.  Patient was started on broad-spectrum empiric antibiotics  with IV vancomycin, cefepime and Flagyl.  Patient was given IV Protonix bolus and started on continuous infusion.  Patient received 1 L normal saline.  Order for transfuse 2 units PRBCs placed. EDP discussed with on-call GI who is aware of patient. The hospitalist service was consulted to admit for further evaluation and management.     Assessment & Plan   Principal Problem:   Acute blood loss anemia Active Problems:   Pneumonia due to COVID-19 virus   Dementia (HCC)   Essential hypertension   History of CVA (cerebrovascular accident)   Hypernatremia   Systemic inflammatory response syndrome (SIRS) associated with organ dysfunction (HCC)   AKI (acute kidney injury) (HCC)   Symptomatic acute blood loss anemia due to GI bleed - POA with Hbg 6.0, down from 12.0 at recent discharge.  With melena and hemoccult positive stool in ED.   Transfused 2 units PRBCs on admission, 2 units 1/28. GI consulted, input appreciated.  Hemodynamically overall stable.  Family prefer conservative management, would consider EGD if bleeding continues. 1/31: Hbg 8.6 - has remained stable in mid-8 to mid-9 range since last transfusion 2/1: Hemoglobin slightly down at 7.8, no reports of melena or bleeding -- CBC in the morning -- IV Protonix BID -- Monitor H&H's, transfuse if Hg 7 or less -- Maintain 2 large bore iv line -- Page GI if acute hemodynamic changes or s/sx of active GIB -- Hold home aspirin and pharmacologic VTE prophylaxis -- Follow-up iron studies, B12 and folate levels -- Consider iron infusion if deficient  Acute urinary retention -  given Cr rise today, renal U/S obtained showing bilateral hydronephrosis and distended bladder.  Ordered for Foley to be placed, start Flomax, Urology consulted.  Acute kidney injury - POA.  Resolved but now recurrent on 2/1 with creatinine bump from 1.43-2.59. AKI on admission was due to prerenal azotemia in setting of GI bleeding and hypovolemia and resolved with  IV fluids.   Recurrent AKI appears due to obstructive uropathy.  Mgmt as above. Hold NSAIDS and ACEI.  Avoid nephrotoxins and hypotension.   --Follow daily BMP's --Resume IV fluids for now  Recent admission for COVID-19 pneumonia - SARS-CoV-2 PCR + 09/06/2020. Was admitted and discharged 09/12/2020, had completed remdesivir, steroids, ceftriaxone/azithromycin for secondary bacterial pneumonia. He was requiring 2 L O2 via [place] on discharge. This admission, initially was on 1.5-2 L [place] oxygen, has now been weaned to room air.  Is now off isolation and may have visitor.  SIRS - POA with tachypnea, leukocytosis, lactic acidosis on arrival without any infectious source identified.  Pulmonary infiltrates on xray are related to recent Covid pneumonia. C. difficile and GI pathogen panels are negative.  Was initially covered empirically with broad spectrum antibiotics (vanc, cefepime, and flagyl). --antibiotics were stopped and patient remains clinically stable, vitals improved --urine culture negative --blood cx neg --continue to monitor  Hypokalemia - replaced.  Monitor BMP and replace as needed.  Hypernatremia - Resolved with d5w.  --Off D5W --Nursing: encourage pt to drink water, have water available in his reach on bedside table at all times --Monitor BMP  Constipation - hx of severe constipation.  Bowel regimen per orders with scheduled senna and MiraLAX, as needed Dulcolax, hold if loose stools or frequent BM's.  If no BM in the next 24 hours please give enemas and/or suppositories.  Diarrhea RESOLVED - due to melena / GI bleeding.  Resolved. C. difficile and GI pathogen panels are negative. Viral gastroenteritis also possible.  Continue supportive care.  Hypertension - chronic, stable to mildly elevated but with lower diastolic BP's.  --Resume home amlodipine, increased to 10 mg.   --Continue to hold lisinopril with recurrent AKI.    History of CVA - Hold aspirin with ongoing GI bleed.   Resume once Hbg stable and improving.  Dementia - Chronic, no major behavioral disturbances.   Appears declining since his wife passed away per son.  --Continue donepezil     DVT prophylaxis: SCDs Start: 09/18/20 2102   Diet:  Diet Orders (From admission, onward)    Start     Ordered   09/25/20 1115  DIET DYS 2 Room service appropriate? Yes; Fluid consistency: Thin  Diet effective now       Question Answer Comment  Room service appropriate? Yes   Fluid consistency: Thin      09/25/20 1114            Code Status: DNR    Subjective 09/26/20    Patient seen awake resting in bed today.  Reports he feels fine.  Asked if anything is bothering him and he answers that he does not know.  This is his usual answer when asking about any symptoms he is having.  Ate all his breakfast per nursing report.  No acute events reported.   Disposition Plan & Communication   Status is: Inpatient  Remains inpatient appropriate because: requires SNF placement for rehab.  Patient has recurrence of AKI requiring IV hydration and further monitoring  Dispo: The patient is from: SNF  Anticipated d/c is to: SNF              Anticipated d/c date is: 09/28/2020              Patient currently is not medically stable for discharge   Difficult to place patient No   Family Communication: spoke with son and daughter-in-law by phone this afternoon 2/1   Consults, Procedures, Significant Events   Consultants:   GI  Procedures:   None  Antimicrobials:  Anti-infectives (From admission, onward)   Start     Dose/Rate Route Frequency Ordered Stop   09/20/20 2100  vancomycin (VANCOREADY) IVPB 1250 mg/250 mL  Status:  Discontinued        1,250 mg 166.7 mL/hr over 90 Minutes Intravenous Every 48 hours 09/18/20 2127 09/20/20 1736   09/19/20 1900  ceFEPIme (MAXIPIME) 2 g in sodium chloride 0.9 % 100 mL IVPB  Status:  Discontinued        2 g 200 mL/hr over 30 Minutes Intravenous Every 24  hours 09/18/20 2131 09/20/20 1736   09/19/20 0400  metroNIDAZOLE (FLAGYL) IVPB 500 mg  Status:  Discontinued        500 mg 100 mL/hr over 60 Minutes Intravenous Every 8 hours 09/18/20 2116 09/20/20 1736   09/18/20 2130  vancomycin (VANCOREADY) IVPB 750 mg/150 mL        750 mg 150 mL/hr over 60 Minutes Intravenous  Once 09/18/20 2121 09/18/20 2309   09/18/20 1900  ceFEPIme (MAXIPIME) 2 g in sodium chloride 0.9 % 100 mL IVPB        2 g 200 mL/hr over 30 Minutes Intravenous  Once 09/18/20 1854 09/18/20 1954   09/18/20 1900  metroNIDAZOLE (FLAGYL) IVPB 500 mg        500 mg 100 mL/hr over 60 Minutes Intravenous  Once 09/18/20 1854 09/18/20 2026   09/18/20 1900  vancomycin (VANCOCIN) IVPB 1000 mg/200 mL premix        1,000 mg 200 mL/hr over 60 Minutes Intravenous  Once 09/18/20 1854 09/18/20 2142        Objective   Vitals:   09/25/20 1952 09/25/20 2311 09/26/20 0545 09/26/20 1555  BP: (!) 126/54 124/70 134/62 (!) 127/53  Pulse: 82 72 73 62  Resp: 20 16 20 17   Temp: 97.7 F (36.5 C) 98.3 F (36.8 C) 97.7 F (36.5 C) 97.9 F (36.6 C)  TempSrc: Oral Oral Oral Oral  SpO2: 97% 98% 97% 95%  Weight:      Height:        Intake/Output Summary (Last 24 hours) at 09/26/2020 1651 Last data filed at 09/26/2020 1005 Gross per 24 hour  Intake 360 ml  Output --  Net 360 ml   Filed Weights   09/18/20 1647  Weight: 69.9 kg    Physical Exam:  General exam: awake resting in bed, alert, no acute distress HEENT: moist mucus membranes, hearing grossly normal  Respiratory system: clear lungs bilaterally, normal respiratory effort, on room air. Cardiovascular system: RRR, no pedal edema.  Gastrointestinal system: soft, non-tender, no distention Central nervous system: Grossly nonfocal exam, normal speech, cranial nerves grossly intact   Labs   Data Reviewed: I have personally reviewed following labs and imaging studies  CBC: Recent Labs  Lab 09/22/20 0610 09/23/20 0255 09/23/20 0818  09/24/20 0211 09/24/20 0743 09/25/20 0552 09/25/20 1830 09/26/20 0359  WBC 13.9* 12.3*  --   --  12.5* 11.0*  --  9.7  HGB 6.3* 9.1*   < >  9.0* 9.5* 8.6* 8.4* 7.8*  HCT 19.3* 28.2*   < > 28.3* 28.3* 26.5* 25.7* 23.4*  MCV 88.5 90.1  --   --  88.4 88.6  --  89.3  PLT 257 218  --   --  217 207  --  177   < > = values in this interval not displayed.   Basic Metabolic Panel: Recent Labs  Lab 09/22/20 0610 09/23/20 0255 09/24/20 0743 09/25/20 0552 09/26/20 0359  NA 150* 145 142 140 143  K 3.8 4.3 4.4 4.1 4.2  CL 123* 119* 114* 115* 115*  CO2 19* 20* 20* 18* 20*  GLUCOSE 157* 119* 107* 97 94  BUN 54* 45* 35* 31* 44*  CREATININE 1.74* 1.71* 1.35* 1.43* 2.59*  CALCIUM 8.2* 8.2* 8.1* 7.9* 7.7*   GFR: Estimated Creatinine Clearance: 18.4 mL/min (A) (by C-G formula based on SCr of 2.59 mg/dL (H)). Liver Function Tests: Recent Labs  Lab 09/20/20 0520  AST 32  ALT 16  ALKPHOS 42  BILITOT 0.5  PROT 5.3*  ALBUMIN 1.9*   No results for input(s): LIPASE, AMYLASE in the last 168 hours. No results for input(s): AMMONIA in the last 168 hours. Coagulation Profile: No results for input(s): INR, PROTIME in the last 168 hours. Cardiac Enzymes: No results for input(s): CKTOTAL, CKMB, CKMBINDEX, TROPONINI in the last 168 hours. BNP (last 3 results) No results for input(s): PROBNP in the last 8760 hours. HbA1C: No results for input(s): HGBA1C in the last 72 hours. CBG: Recent Labs  Lab 09/21/20 0730  GLUCAP 92   Lipid Profile: No results for input(s): CHOL, HDL, LDLCALC, TRIG, CHOLHDL, LDLDIRECT in the last 72 hours. Thyroid Function Tests: No results for input(s): TSH, T4TOTAL, FREET4, T3FREE, THYROIDAB in the last 72 hours. Anemia Panel: No results for input(s): VITAMINB12, FOLATE, FERRITIN, TIBC, IRON, RETICCTPCT in the last 72 hours. Sepsis Labs: Recent Labs  Lab 09/20/20 0520  PROCALCITON 0.15    Recent Results (from the past 240 hour(s))  Urine culture     Status:  None   Collection Time: 09/18/20  4:50 PM   Specimen: Urine, Random  Result Value Ref Range Status   Specimen Description   Final    URINE, RANDOM Performed at [facility], [address], [place], [place] [address]    Special Requests   Final    NONE Performed at [facility], [address], [place], [place] [address]    Culture   Final    NO GROWTH Performed at [facility], [address] [place], [place] [address]    Report Status 09/20/2020 FINAL  Final  Blood culture (routine x 2)     Status: None   Collection Time: 09/18/20  4:56 PM   Specimen: BLOOD  Result Value Ref Range Status   Specimen Description BLOOD LEFT ANTECUBITAL  Final   Special Requests   Final    BOTTLES DRAWN AEROBIC AND ANAEROBIC Blood Culture adequate volume   Culture   Final    NO GROWTH 5 DAYS Performed at [facility], [address] [place], [place] [address]    Report Status 09/23/2020 FINAL  Final  Blood culture (routine x 2)     Status: None   Collection Time: 09/18/20  5:01 PM   Specimen: BLOOD  Result Value Ref Range Status   Specimen Description BLOOD BLOOD RIGHT FOREARM  Final   Special Requests   Final    BOTTLES DRAWN AEROBIC AND ANAEROBIC  Blood Culture adequate volume   Culture   Final    NO GROWTH 5 DAYS Performed at [facility], [address] [place], [place] [address]    Report Status 09/23/2020 FINAL  Final  Gastrointestinal Panel by PCR , Stool     Status: None   Collection Time: 09/18/20  5:06 PM   Specimen: Stool  Result Value Ref Range Status   Campylobacter species NOT DETECTED NOT DETECTED Final   Plesimonas shigelloides NOT DETECTED NOT DETECTED Final   Salmonella species NOT DETECTED NOT DETECTED Final   Yersinia enterocolitica NOT DETECTED NOT DETECTED Final   Vibrio species NOT DETECTED NOT DETECTED Final   Vibrio cholerae NOT DETECTED NOT DETECTED Final   Enteroaggregative E coli (EAEC) NOT DETECTED NOT  DETECTED Final   Enteropathogenic E coli (EPEC) NOT DETECTED NOT DETECTED Final   Enterotoxigenic E coli (ETEC) NOT DETECTED NOT DETECTED Final   Shiga like toxin producing E coli (STEC) NOT DETECTED NOT DETECTED Final   Shigella/Enteroinvasive E coli (EIEC) NOT DETECTED NOT DETECTED Final   Cryptosporidium NOT DETECTED NOT DETECTED Final   Cyclospora cayetanensis NOT DETECTED NOT DETECTED Final   Entamoeba histolytica NOT DETECTED NOT DETECTED Final   Giardia lamblia NOT DETECTED NOT DETECTED Final   Adenovirus F40/41 NOT DETECTED NOT DETECTED Final   Astrovirus NOT DETECTED NOT DETECTED Final   Norovirus GI/GII NOT DETECTED NOT DETECTED Final   Rotavirus A NOT DETECTED NOT DETECTED Final   Sapovirus (I, II, IV, and V) NOT DETECTED NOT DETECTED Final    Comment: Performed at [facility], [address] [place], [place] [address]  C Difficile Quick Screen (NO PCR Reflex)     Status: None   Collection Time: 09/18/20  5:07 PM   Specimen: Stool  Result Value Ref Range Status   C Diff antigen NEGATIVE NEGATIVE Final   C Diff toxin NEGATIVE NEGATIVE Final   C Diff interpretation No C. difficile detected.  Final    Comment: Performed at [facility], [address], [place], [place] [address]      Imaging Studies   US RENAL  Result Date: 2020/10/02 CLINICAL DATA:  Acute renal injury. EXAM: RENAL / URINARY TRACT ULTRASOUND COMPLETE COMPARISON:  CT 09/18/2020. FINDINGS: Right Kidney: Renal measurements: 10.9 x 5.3 x 5.3 cm = volume: 157.9 mL. Echogenicity within normal limits. Mild hydronephrosis. Multiple renal calyceal stones versus sloughed papilla. A renal pelvic stone measuring up to 1.2 cm cannot be excluded. It should be noted that no renal stones on the right were noted on CT of 09/18/2020. 3.6 cm right renal simple cyst again noted. Left Kidney: Renal measurements: 13.1 x 7.3 x 6.1 cm = volume: 3 of 3.4 mL. Echogenicity within normal limits. Minimal amount of left  perinephric fluid. Mild left hydronephrosis. 9 mm left renal calyceal stone again noted. Bladder: Bladder is distended and contains mild debris. No ureteral jets noted. Other: None. IMPRESSION: 1. Mild bilateral hydronephrosis. Minimal amount of left perinephric fluid noted. Bladder is distended and contains mild debris. No ureteral jets noted. 2. Multiple right renal calyceal stones versus sloughed papilla. A renal pelvic stone measuring up to 1.2 cm cannot be excluded. It should be noted that no renal stones on the right were noted on CT of 09/18/2020. 3.  9 mm left renal calyceal stone again noted. 4.  3.6 cm simple right renal cyst again noted. 5.  Mild right renal atrophy. Electronically Signed   By: Maisie Fus  Register  On: 09/26/2020 12:35     Medications   Scheduled Meds: . acidophilus  2 capsule Oral TID  . amLODipine  10 mg Oral Daily  . donepezil  5 mg Oral QHS  . latanoprost  1 drop Both Eyes QHS  . pantoprazole  40 mg Intravenous Q12H  . polyethylene glycol  17 g Oral Daily  . senna-docusate  1 tablet Oral BID  . timolol  1 drop Both Eyes Daily   Continuous Infusions: . sodium chloride 75 mL/hr at 09/26/20 1625       LOS: 8 days    Time spent: 25 minutes with greater than 50% spent at bedside and in coordination of care.    Pennie Banter, DO [facility]  09/26/2020, 4:51 PM    If 7PM-7AM, please contact night-coverage. How to contact the [facility] Attending or Consulting provider 7A - 7P or covering provider during after hours 7P -7A, for this patient?    1. Check the care team in [facility] and look for a) attending/consulting [facility] provider listed and b) the [facility] team listed 2. Log into [email or web address] and use [place]'s universal password to access. If you do not have the password, please contact the hospital operator. 3. Locate the [facility] provider you are looking for under [facility] and page to a number that you can be directly reached. 4. If you still have  difficulty reaching the provider, please page the [facility] (Director on Call) for the Hospitalists listed on amion for assistance.

## 2020-09-26 NOTE — Consult Note (Signed)
Urology Consult  Requesting physician: Esaw Grandchild, DO  Reason for consultation: Rising creatinine with hydronephrosis  Chief Complaint: None  History of Present Illness: David [name] is a 87 y.o. with past medical history significant for dementia and recent admission for COVID-19 pneumonia who was admitted from the ED on 09/18/2020 with acute blood loss anemia secondary to GI bleed.  Creatinine on admission was 1.88 and has fluctuated between 1.92 and 1.31 until today when creatinine returned 2.59  History chronic kidney disease stage III  Relates to voiding problems in the past but does not remember how long.  Has been on tamsulosin for several years  Renal ultrasound was ordered which showed mild, bilateral hydronephrosis and bladder distention. There were felt to be multiple calyceal stones versus sloughed papilla on the right and a possible renal pelvic stone measuring 12 mm. There was a 9 mm nonobstructing left renal stone.  A CT performed on admission did not show a renal pelvic calculus or right calyceal calculi.  On my review there was also mild, bilateral hydronephrosis and bladder distention  Foley catheter was placed this evening.    Past Medical History:  Diagnosis Date  . Anxiety   . Arthritis    Rheumatoid  . Dementia (HCC)    Dx in May  . Hypertension   . Stroke [facility])     Past Surgical History:  Procedure Laterality Date  . 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 SPINE SURGERY  2014   "ruptured disc"     Home Medications:  Current Meds  Medication Sig  . acetaminophen (TYLENOL) 325 MG tablet Take 2 tablets (650 mg total) by mouth every 6 (six) hours as needed for mild pain or headache (fever >/= 101).  Marland Kitchen amLODipine (NORVASC) 5 MG tablet Take 5 mg by mouth daily.  Marland Kitchen aspirin EC 81 MG tablet Take 1 tablet (81 mg total) by mouth daily.  . bisacodyl (DULCOLAX) 10 MG suppository Place 1 suppository (10 mg total) rectally daily as needed  for severe constipation.  . bisacodyl (DULCOLAX) 5 MG EC tablet Take 1 tablet (5 mg total) by mouth daily as needed for moderate constipation.  . [EXPIRED] dextromethorphan-guaiFENesin (MUCINEX DM) 30-600 MG 12hr tablet Take 1 tablet by mouth 2 (two) times daily for 7 days.  Marland Kitchen donepezil (ARICEPT) 5 MG tablet Take 1 tablet (5 mg total) by mouth at bedtime.  . Ipratropium-Albuterol (COMBIVENT) 20-100 MCG/ACT AERS respimat Inhale 1 puff into the lungs every 6 (six) hours.  Marland Kitchen latanoprost (XALATAN) 0.005 % ophthalmic solution Place 1 drop into both eyes at bedtime.  Marland Kitchen lisinopril (ZESTRIL) 5 MG tablet Take 1 tablet (5 mg total) by mouth daily.  . melatonin 3 MG TABS tablet Take 1 tablet (3 mg total) by mouth at bedtime.  . polyethylene glycol (MIRALAX / GLYCOLAX) 17 g packet Take 17 g by mouth daily. Hold if frequent BM's or loose stools  . senna-docusate (SENOKOT-S) 8.6-50 MG tablet Take 1 tablet by mouth at bedtime. Hold if frequent BM's or loose stools  . tamsulosin (FLOMAX) 0.4 MG CAPS capsule Take 1 capsule (0.4 mg total) by mouth daily after supper.  . timolol (TIMOPTIC) 0.5 % ophthalmic solution Place 1 drop into both eyes daily.    Allergies: No Known Allergies  Family History  Problem Relation Age of Onset  . Hypertension Mother     Social History:  reports that he has quit smoking. His smoking use included cigarettes.  He has never used smokeless tobacco. He reports that he does not drink alcohol. No history on file for drug use.  ROS: Unobtainable secondary to patient's dementia  Physical Exam:  Vital signs in last 24 hours: Temp:  [97.7 F (36.5 C)-98.3 F (36.8 C)] 97.9 F (36.6 C) (02/01 1555) Pulse Rate:  [62-82] 62 (02/01 1555) Resp:  [16-20] 17 (02/01 1555) BP: (124-134)/(53-70) 127/53 (02/01 1555) SpO2:  [95 %-98 %] 95 % (02/01 1555) Constitutional:  Alert, No acute distress HEENT: Marland AT, moist mucus membranes.  Trachea midline, no masses Cardiovascular: Regular rate  and rhythm, no clubbing, cyanosis, or edema. Respiratory: Normal respiratory effort, lungs clear bilaterally GI: Abdomen is soft, nontender, nondistended, no abdominal masses GU: Foley catheter draining clear urine  Skin: No rashes, bruises or suspicious lesions    Laboratory Data:  Recent Labs    09/24/20 0743 09/25/20 0552 09/25/20 1830 09/26/20 0359  WBC 12.5* 11.0*  --  9.7  HGB 9.5* 8.6* 8.4* 7.8*  HCT 28.3* 26.5* 25.7* 23.4*   Recent Labs    09/24/20 0743 09/25/20 0552 09/26/20 0359  NA 142 140 143  K 4.4 4.1 4.2  CL 114* 115* 115*  CO2 20* 18* 20*  GLUCOSE 107* 97 94  BUN 35* 31* 44*  CREATININE 1.35* 1.43* 2.59*  CALCIUM 8.1* 7.9* 7.7*   No results for input(s): LABPT, INR in the last 72 hours. No results for input(s): LABURIN in the last 72 hours. Results for orders placed or performed during the hospital encounter of 09/18/20  Urine culture     Status: None   Collection Time: 09/18/20  4:50 PM   Specimen: Urine, Random  Result Value Ref Range Status   Specimen Description   Final    URINE, RANDOM Performed at [facility], [address] [place], [place] [address]    Special Requests   Final    NONE Performed at [facility], [address] [place], [place] [address]    Culture   Final    NO GROWTH Performed at [facility], [address] [place], [place] [address]    Report Status 09/20/2020 FINAL  Final  Blood culture (routine x 2)     Status: None   Collection Time: 09/18/20  4:56 PM   Specimen: BLOOD  Result Value Ref Range Status   Specimen Description BLOOD LEFT ANTECUBITAL  Final   Special Requests   Final    BOTTLES DRAWN AEROBIC AND ANAEROBIC Blood Culture adequate volume   Culture   Final    NO GROWTH 5 DAYS Performed at [facility], [address], [place], [place] [address]    Report Status 09/23/2020 FINAL  Final  Blood culture (routine x 2)     Status: None   Collection Time: 09/18/20   5:01 PM   Specimen: BLOOD  Result Value Ref Range Status   Specimen Description BLOOD BLOOD RIGHT FOREARM  Final   Special Requests   Final    BOTTLES DRAWN AEROBIC AND ANAEROBIC Blood Culture adequate volume   Culture   Final    NO GROWTH 5 DAYS Performed at [facility], [address], [place], [place] [address]    Report Status 09/23/2020 FINAL  Final  Gastrointestinal Panel by PCR , Stool     Status: None   Collection Time: 09/18/20  5:06 PM   Specimen: Stool  Result Value Ref Range Status   Campylobacter species NOT DETECTED NOT DETECTED  Final   Plesimonas shigelloides NOT DETECTED NOT DETECTED Final   Salmonella species NOT DETECTED NOT DETECTED Final   Yersinia enterocolitica NOT DETECTED NOT DETECTED Final   Vibrio species NOT DETECTED NOT DETECTED Final   Vibrio cholerae NOT DETECTED NOT DETECTED Final   Enteroaggregative E coli (EAEC) NOT DETECTED NOT DETECTED Final   Enteropathogenic E coli (EPEC) NOT DETECTED NOT DETECTED Final   Enterotoxigenic E coli (ETEC) NOT DETECTED NOT DETECTED Final   Shiga like toxin producing E coli (STEC) NOT DETECTED NOT DETECTED Final   Shigella/Enteroinvasive E coli (EIEC) NOT DETECTED NOT DETECTED Final   Cryptosporidium NOT DETECTED NOT DETECTED Final   Cyclospora cayetanensis NOT DETECTED NOT DETECTED Final   Entamoeba histolytica NOT DETECTED NOT DETECTED Final   Giardia lamblia NOT DETECTED NOT DETECTED Final   Adenovirus F40/41 NOT DETECTED NOT DETECTED Final   Astrovirus NOT DETECTED NOT DETECTED Final   Norovirus GI/GII NOT DETECTED NOT DETECTED Final   Rotavirus A NOT DETECTED NOT DETECTED Final   Sapovirus (I, II, IV, and V) NOT DETECTED NOT DETECTED Final    Comment: Performed at [facility], [address] [place], [place] [address]  C Difficile Quick Screen (NO PCR Reflex)     Status: None   Collection Time: 09/18/20  5:07 PM   Specimen: Stool  Result Value Ref Range Status   C Diff antigen NEGATIVE  NEGATIVE Final   C Diff toxin NEGATIVE NEGATIVE Final   C Diff interpretation No C. difficile detected.  Final    Comment: Performed at [facility], [address], [place], [place] [address]     Radiologic Imaging: US RENAL  Result Date: 09/26/2020 CLINICAL DATA:  Acute renal injury. EXAM: RENAL / URINARY TRACT ULTRASOUND COMPLETE COMPARISON:  CT 09/18/2020. FINDINGS: Right Kidney: Renal measurements: 10.9 x 5.3 x 5.3 cm = volume: 157.9 mL. Echogenicity within normal limits. Mild hydronephrosis. Multiple renal calyceal stones versus sloughed papilla. A renal pelvic stone measuring up to 1.2 cm cannot be excluded. It should be noted that no renal stones on the right were noted on CT of 09/18/2020. 3.6 cm right renal simple cyst again noted. Left Kidney: Renal measurements: 13.1 x 7.3 x 6.1 cm = volume: 3 of 3.4 mL. Echogenicity within normal limits. Minimal amount of left perinephric fluid. Mild left hydronephrosis. 9 mm left renal calyceal stone again noted. Bladder: Bladder is distended and contains mild debris. No ureteral jets noted. Other: None. IMPRESSION: 1. Mild bilateral hydronephrosis. Minimal amount of left perinephric fluid noted. Bladder is distended and contains mild debris. No ureteral jets noted. 2. Multiple right renal calyceal stones versus sloughed papilla. A renal pelvic stone measuring up to 1.2 cm cannot be excluded. It should be noted that no renal stones on the right were noted on CT of 09/18/2020. 3.  9 mm left renal calyceal stone again noted. 4.  3.6 cm simple right renal cyst again noted. 5.  Mild right renal atrophy. Electronically Signed   By: Maisie Fus  Register   On: 09/26/2020 12:35    Impression/Assessment:   87 y.o. male with mild, bilateral hydronephrosis most likely secondary to urinary retention  CT on admission did show mild, bilateral hydronephrosis and bladder distention  Estimated bladder volume ultrasound today was >1100 mL  No evidence of renal  calculi on CT  Recommendation:   Follow creatinine with Foley catheter drainage  Would leave catheter indwelling at least 10 days and residuals need to be followed closely as  an outpatient   09/26/2020, 6:33 PM  Irineo Axon,  MD

## 2020-09-27 DIAGNOSIS — Z515 Encounter for palliative care: Secondary | ICD-10-CM | POA: Diagnosis not present

## 2020-09-27 DIAGNOSIS — D62 Acute posthemorrhagic anemia: Secondary | ICD-10-CM | POA: Diagnosis not present

## 2020-09-27 DIAGNOSIS — Z66 Do not resuscitate: Secondary | ICD-10-CM | POA: Diagnosis not present

## 2020-09-27 DIAGNOSIS — Z7189 Other specified counseling: Secondary | ICD-10-CM | POA: Diagnosis not present

## 2020-09-27 LAB — MRSA PCR SCREENING: MRSA by PCR: NEGATIVE

## 2020-09-27 LAB — CBC
HCT: 22.3 % — ABNORMAL LOW (ref 39.0–52.0)
Hemoglobin: 7.5 g/dL — ABNORMAL LOW (ref 13.0–17.0)
MCH: 30.7 pg (ref 26.0–34.0)
MCHC: 33.6 g/dL (ref 30.0–36.0)
MCV: 91.4 fL (ref 80.0–100.0)
Platelets: 147 10*3/uL — ABNORMAL LOW (ref 150–400)
RBC: 2.44 MIL/uL — ABNORMAL LOW (ref 4.22–5.81)
RDW: 17.4 % — ABNORMAL HIGH (ref 11.5–15.5)
WBC: 7.6 10*3/uL (ref 4.0–10.5)
nRBC: 0 % (ref 0.0–0.2)

## 2020-09-27 LAB — BASIC METABOLIC PANEL
Anion gap: 6 (ref 5–15)
BUN: 44 mg/dL — ABNORMAL HIGH (ref 8–23)
CO2: 20 mmol/L — ABNORMAL LOW (ref 22–32)
Calcium: 7.6 mg/dL — ABNORMAL LOW (ref 8.9–10.3)
Chloride: 118 mmol/L — ABNORMAL HIGH (ref 98–111)
Creatinine, Ser: 2.2 mg/dL — ABNORMAL HIGH (ref 0.61–1.24)
GFR, Estimated: 28 mL/min — ABNORMAL LOW (ref >60)
Glucose, Bld: 83 mg/dL (ref 70–99)
Potassium: 4 mmol/L (ref 3.5–5.1)
Sodium: 144 mmol/L (ref 135–145)

## 2020-09-27 MED ORDER — AMLODIPINE BESYLATE 5 MG PO TABS
5.0000 mg | ORAL_TABLET | Freq: Every day | ORAL | Status: DC
Start: 2020-09-27 — End: 2020-09-29
  Administered 2020-09-27 – 2020-09-29 (×3): 5 mg via ORAL
  Filled 2020-09-27 (×3): qty 1

## 2020-09-27 MED ORDER — FERROUS SULFATE 325 (65 FE) MG PO TABS
325.0000 mg | ORAL_TABLET | Freq: Every day | ORAL | Status: DC
  Administered 2020-09-28 – 2020-09-29 (×2): 325 mg via ORAL
  Filled 2020-09-27 (×2): qty 1

## 2020-09-27 NOTE — TOC Progression Note (Signed)
Transition of Care [facility]) - Progression Note    Patient Details  Name: David [name] MRN: [id number] Date of Birth: 03-21-1927  [facility] CM/SW Contact  Chapman Fitch, RN Phone Number: 09/27/2020, 2:40 PM  Clinical Narrative:    Per Fabian November with palliative son to discuss with family about patient discharging home with hospice.      Expected Discharge Plan: Skilled Nursing Facility Barriers to Discharge: Continued Medical Work up  Expected Discharge Plan and Services Expected Discharge Plan: Skilled Nursing Facility       Living arrangements for the past 2 months: Single Family Home                                       Social Determinants of Health (SDOH) Interventions    Readmission Risk Interventions No flowsheet data found.

## 2020-09-27 NOTE — Care Management Important Message (Signed)
Important Message  Patient Details  Name: David [name] MRN: [id number] Date of Birth: 03-Jul-1927   Medicare Important Message Given:  Yes     [name] 09/27/2020, 10:50 AM

## 2020-09-27 NOTE — Progress Notes (Signed)
Occupational Therapy Treatment Patient Details Name: David [name] MRN: [id number] DOB: [birth year] Today's Date: 09/27/2020    History of present illness David [name] is a 89yoF who [facility]. Patient recently admitted from 09/06/2020-09/12/2020 for acute hypoxic respiratory failure due to COVID-19 pneumonia and CAP. He was also noted to have orthostatic hypotension while in the hospital. PMH: dementia, history of CVA, hypertension, recent admission for COVID-19 pneumonia.   OT comments  Pt seen for OT treatment on this date. Upon arrival to room pt asleep in bed, however easily awoken. Pt A&O x 1, reporting no pain, and agreeable to tx. This session, pt able to participate in bed-level washing/drying face, requiring multi-model cues and MIN A to ensure thoroughness. Pt required MAX-TOTAL assist to sit EOB, with pt only able to tolerate ~20sec of sitting before initiating return to supine; pt grimacing during bed mobility, but unable to localize pain/discomfort, and subsequently assisted back to bed. Pt continues to benefit from skilled OT services to maximize return to PLOF and minimize risk of future falls, injury, caregiver burden, and readmission. Will continue to follow POC. Discharge recommendation remains appropriate.    Follow Up Recommendations  SNF    Equipment Recommendations  Other (comment) (defer to next venue of care)    Recommendations for Other Services      Precautions / Restrictions Precautions Precautions: Fall Restrictions Weight Bearing Restrictions: No Other Position/Activity Restrictions: Orthostatic hypotension       Mobility Bed Mobility Overal bed mobility: Needs Assistance Bed Mobility: Rolling;Supine to Sit;Sit to Supine Rolling: Max assist   Supine to sit: Max assist Sit to supine: Total assist   General bed mobility comments: Total assist to sit EOB, with pt only able to tolerate sitting for ~15sec before initiating return to bed. Grimacing  during bed mobility, but unable to localize pain/discomfort  Transfers Overall transfer level:  (not appropriate to advacne at this time.)               General transfer comment: deferred    Balance Overall balance assessment: Needs assistance Sitting-balance support: Feet supported;Bilateral upper extremity supported Sitting balance-Leahy Scale: Zero   Postural control: Left lateral lean;Posterior lean   Standing balance-Leahy Scale: Zero                             ADL either performed or assessed with clinical judgement   ADL Overall ADL's : Needs assistance/impaired     Grooming: Wash/dry face;Minimal assistance;Bed level Grooming Details (indicate cue type and reason): Required multi-model cues and MIN A to ensure thoroughness                                     Vision       Perception     Praxis      Cognition Arousal/Alertness: Awake/alert Behavior During Therapy: Flat affect Overall Cognitive Status: History of cognitive impairments - at baseline                                 General Comments: A&Ox1; pt stating that he is 41 and at home. Pleasant and agreeable to session, requiring multi-model cues for command follow                   Pertinent Vitals/ Pain  Pain Assessment: No/denies pain         Frequency  Min 1X/week        Progress Toward Goals  OT Goals(current goals can now be found in the care plan section)  Progress towards OT goals: Progressing toward goals  Acute Rehab OT Goals Patient Stated Goal: to feel better OT Goal Formulation: With patient Time For Goal Achievement: 10/05/20 Potential to Achieve Goals: Fair  Plan Discharge plan remains appropriate;Frequency remains appropriate    Co-evaluation                 AM-PAC OT "6 Clicks" Daily Activity     Outcome Measure   Help from another person eating meals?: A Lot Help from another person taking care of  personal grooming?: A Little Help from another person toileting, which includes using toliet, bedpan, or urinal?: A Lot Help from another person bathing (including washing, rinsing, drying)?: Total Help from another person to put on and taking off regular upper body clothing?: A Lot Help from another person to put on and taking off regular lower body clothing?: A Lot 6 Click Score: 12    End of Session    OT Visit Diagnosis: Muscle weakness (generalized) (M62.81);Other symptoms and signs involving cognitive function   Activity Tolerance Patient limited by fatigue   Patient Left in bed;with call bell/phone within reach;with bed alarm set   Nurse Communication Mobility status;Other (comment) (notified nursing that IV completed during session)        Time: 1045-1110 OT Time Calculation (min): 25 min  Charges: OT General Charges $OT Visit: 1 Visit OT Treatments $Self Care/Home Management : 8-22 mins $Therapeutic Activity: 8-22 mins  Matthew Folks, OTR/L [facility] [phone]

## 2020-09-27 NOTE — Progress Notes (Signed)
Physical Therapy Treatment Patient Details Name: David [name] MRN: [id number] DOB: 10/07/1926 Today's Date: 09/27/2020    History of Present Illness David [name] is a 89yoF who comes [facility]. Patient recently admitted from 09/06/2020-09/12/2020 for acute hypoxic respiratory failure due to COVID-19 pneumonia and CAP. He was also noted to have orthostatic hypotension while in the hospital. PMH: dementia, history of CVA, hypertension, recent admission for COVID-19 pneumonia.    PT Comments    Pt in bed awake upon entry, agreeable to session, interactive, appears to be presenting with near-baseline level mentation, mild to moderate confusion, but interactive and able to follow commands throughout. Pt remains incredibly weak, totalA to EOB, requirs trunk support for 3 minutes, then able to sit unsupported for 4-5 minutes, but alertness and distress progress quickly, pt assisted backto supine, noted dark brown, soft BM under patient. RN made aware.   Follow Up Recommendations  SNF;Supervision for mobility/OOB;Supervision/Assistance - 24 hour     Equipment Recommendations  None recommended by PT    Recommendations for Other Services       Precautions / Restrictions Precautions Precautions: Fall Restrictions Weight Bearing Restrictions: No Other Position/Activity Restrictions: Orthostatic hypotension    Mobility  Bed Mobility Overal bed mobility: Needs Assistance   Rolling: Max assist   Supine to sit: Max assist Sit to supine: Total assist   General bed mobility comments: very weak, grimaces with high effort BUE pulling up forward. Requires totalA trunk support for 3 minutes, then eventually able to sit modI holding onto fixed object x 4 minutes, becomes more dizzy,nauseated, fatigued.  Transfers Overall transfer level:  (not appropriate to advacne at this time.)                  Ambulation/Gait                 Stairs             Wheelchair  Mobility    Modified Rankin (Stroke Patients Only)       Balance Overall balance assessment: Needs assistance   Sitting balance-Leahy Scale: Zero       Standing balance-Leahy Scale: Zero                              Cognition Arousal/Alertness: Awake/alert Behavior During Therapy: Flat affect;Anxious;Impulsive Overall Cognitive Status: History of cognitive impairments - at baseline                                        Exercises      General Comments        Pertinent Vitals/Pain Pain Assessment: No/denies pain    Home Living                      Prior Function            PT Goals (current goals can now be found in the care plan section) Acute Rehab PT Goals Patient Stated Goal: to feel better PT Goal Formulation: With patient Time For Goal Achievement: 10/09/20 Potential to Achieve Goals: Fair Progress towards PT goals: Progressing toward goals    Frequency    Min 2X/week      PT Plan Current plan remains appropriate    Co-evaluation  AM-PAC PT "6 Clicks" Mobility   Outcome Measure  Help needed turning from your back to your side while in a flat bed without using bedrails?: A Lot Help needed moving from lying on your back to sitting on the side of a flat bed without using bedrails?: A Lot Help needed moving to and from a bed to a chair (including a wheelchair)?: Total Help needed standing up from a chair using your arms (e.g., wheelchair or bedside chair)?: Total Help needed to walk in hospital room?: Total Help needed climbing 3-5 steps with a railing? : Total 6 Click Score: 8    End of Session   Activity Tolerance: Patient tolerated treatment well;No increased pain Patient left: in bed;with call bell/phone within reach Nurse Communication: Mobility status PT Visit Diagnosis: Unsteadiness on feet (R26.81);Difficulty in walking, not elsewhere classified (R26.2);Muscle weakness  (generalized) (M62.81);History of falling (Z91.81)     Time: 0950-1004 PT Time Calculation (min) (ACUTE ONLY): 14 min  Charges:  $Therapeutic Activity: 8-22 mins                     10:37 AM, 09/27/20 David [name], PT, DPT Physical Therapist - [facility]  [phone] (ASCOM)    David [name] 09/27/2020, 10:34 AM

## 2020-09-27 NOTE — Progress Notes (Signed)
Daily Progress Note   Patient Name: David [name]       Date: 09/27/2020 DOB: [birth year]  Age: 87 y.o. MRN#: [id number] Attending Physician: David Ito, MD Primary Care Physician: David [name] Primary Care Admit Date: 09/18/2020  Reason for Consultation/Follow-up: Establishing goals of care  Subjective: Patient denies complaints. Ate breakfast ~75%. Remains confused, only oriented to self. Lethargic and weak. No family at bedside.   Length of Stay: 9  Current Medications: Scheduled Meds:  . acidophilus  2 capsule Oral TID  . amLODipine  5 mg Oral Daily  . Chlorhexidine Gluconate Cloth  6 each Topical Daily  . donepezil  5 mg Oral QHS  . [START ON 09/28/2020] ferrous sulfate  325 mg Oral Q breakfast  . latanoprost  1 drop Both Eyes QHS  . pantoprazole  40 mg Intravenous Q12H  . polyethylene glycol  17 g Oral Daily  . senna-docusate  1 tablet Oral BID  . tamsulosin  0.4 mg Oral QPC supper  . timolol  1 drop Both Eyes Daily    Continuous Infusions: . sodium chloride 75 mL/hr at 09/27/20 1121    PRN Meds: bisacodyl, hydrALAZINE, ondansetron **OR** ondansetron (ZOFRAN) IV  Physical Exam Constitutional:      General: He is not in acute distress.    Comments: lethargic  Pulmonary:     Effort: Pulmonary effort is normal. No respiratory distress.  Skin:    General: Skin is warm and dry.  Neurological:     Motor: Weakness present.  Psychiatric:        Behavior: Behavior is slowed and withdrawn.             Vital Signs: BP (!) 121/54 (BP Location: Left Arm)   Pulse 62   Temp 98.2 F (36.8 C) (Oral)   Resp 18   Ht 5\' 9"  (1.753 m)   Wt 69.9 kg   SpO2 96%   BMI 22.74 kg/m  SpO2: SpO2: 96 % O2 Device: O2 Device: Room Air O2 Flow Rate: O2 Flow Rate (L/min): 2 L/min  Intake/output  summary:   Intake/Output Summary (Last 24 hours) at 09/27/2020 1558 Last data filed at 09/27/2020 1400 Gross per 24 hour  Intake 1178.49 ml  Output 2651 ml  Net -1472.51 ml   LBM: Last BM Date: 09/20/20 Baseline Weight: Weight: 69.9 kg Most recent weight: Weight: 69.9 kg       Palliative Assessment/Data: PPS 30%    Flowsheet Rows   Flowsheet Row Most Recent Value  Intake Tab   Referral Department Hospitalist  Unit at Time of Referral Med/Surg Unit  Palliative Care Primary Diagnosis Other (Comment)  [GI bleed]  Date Notified 09/22/20  Palliative Care Type New Palliative care  Reason for referral Clarify Goals of Care  Date of Admission 09/18/20  Date first seen by Palliative Care 09/22/20  # of days Palliative referral response time 0 Day(s)  # of days IP prior to Palliative referral 4  Clinical Assessment   Palliative Performance Scale Score 30%  Psychosocial & Spiritual Assessment   Palliative Care Outcomes   Patient/Family meeting held? Yes  Who was at the meeting? son and DIL  Palliative Care Outcomes Clarified goals of  care, Provided psychosocial or spiritual support, Provided advance care planning      Patient Active Problem List   Diagnosis Date Noted  . Acute blood loss anemia 09/18/2020  . Dementia (HCC)   . Essential hypertension   . History of CVA (cerebrovascular accident)   . Hypernatremia   . Systemic inflammatory response syndrome (SIRS) associated with organ dysfunction (HCC)   . AKI (acute kidney injury) (HCC)   . Pneumonia due to COVID-19 virus 09/06/2020  . Acute hypoxemic respiratory failure (HCC) 09/06/2020  . Bilateral carotid artery stenosis 06/11/2017  . Dizziness 08/08/2016  . Syncopal episodes 08/08/2016    Palliative Care Assessment & Plan   HPI: 87 y.o. male  with past medical history of dementia, history of CVA, hypertension, and RA admitted on 09/18/2020 with shortness of breath and diarrhea. Patient recently admitted from  09/06/2020-09/12/2020 for acute hypoxic respiratory failure due to COVID-19 pneumonia andCAP. This admission found to have hgb of 6. C. difficile and GI pathogen panels both negative. Chest x-ray with interval improvement in bilateral pulmonary opacities from prior. Family initially declined endoscopy; however, hgb dropped again and they are now considering endoscopy for Monday. PMT consulted to discuss GOC.   Assessment: Follow up today with patient's son and DIL. First spoke with son independently. He asked that I call back later and review the information with him and his wife, David [name].    We discussed patient's current illness and what it means in the larger context of patient's on-going co-morbidities.  We reviewed David [name] profound weakness. David [name] tells me they visited David [name] over the weekend and were shocked at how weak he was. Also concerned that he remains very confused and lethargic, far from baseline.   I attempted to elicit values and goals of care important to the patient.  David [name] again shares with me patient's previously stated wishes to avoid aggressive medical interventions and "let nature take its course".   The difference between aggressive medical intervention and comfort care was considered in light of the patient's goals of care. We reviewed ongoing work up and rehab placement vs comfort focused care and home with hospice. Discussed philosophy of hospice care and type of support provided at home.   Discussed with family the importance of continued conversation with family and the medical providers regarding overall plan of care and treatment options, ensuring decisions are within the context of the patient's values and GOCs.    Family requests time to consider their options and speak together about how to move forward with David [name]'s care. We planned to follow up tomorrow.   Questions and concerns were addressed. The family was encouraged to call with questions or concerns.    Recommendations/Plan:  Ongoing goals of care conversations - family considering comfort measures/home with hospice vs ongoing medical work up and rehab stay  Will f/u with family 2/3  Code Status:  DNR  Discharge Planning:  To Be Determined  Care plan was discussed with son and DIL, case manager  Thank you for allowing the Palliative Medicine Team to assist in the care of this patient.   Total Time 45 minutes Prolonged Time Billed  no       Greater than 50%  of this time was spent counseling and coordinating care related to the above assessment and plan.  Gerlean Ren, DNP, [facility] Palliative Medicine Team Team Phone # [phone]  Pager [phone]

## 2020-09-27 NOTE — Progress Notes (Signed)
PROGRESS NOTE    David [name]  KPT:[id number] DOB: 1927-07-10 DOA: 09/18/2020 PCP: [facility]    Brief Narrative:  David [name] a 87 y.o.malewith medical history significant fordementia, history of CVA, hypertension, recent admission for COVID-19 pneumonia who presents to the ED from SNF for evaluation of shortness of breath and diarrhea.History is limited from patient due to underlying dementia and is otherwise supplemented by EDP, chart review, and patient's son by phone.  Patient recently admitted from 09/06/2020-09/12/2020 for acute hypoxic respiratory failure due to COVID-19 pneumonia andCAP. Patient was treated with IV remdesivir, steroids, IV ceftriaxone/azithromycin. He was discharged on 2 L of home O2 via [place] to SNF. He was also noted to have orthostatic hypotension while in the hospital.  Patient returns to the ED from SNF due to reported shortness of breath and loose stools. Per ED triage notes patient was brought in via EMS on 100% NRB but switched to 3 L supplemental O2 via [place] on arrival. Patient complains of diarrhea and abdominal pain but is not able to provide further history.   Labs notable for BUN 76, creatinine 1.88 (1.08 on 09/11/2020), hemoglobin 6.0 (12.0 on 09/10/2020), WBC 26.5, hs-troponin 43, lactic acid 2.9 > 1.6. C. difficile and GI pathogen panels both negative.  Chest x-ray with interval improvement in bilateral pulmonary opacities from prior.  CTA chest/abdomen/pelvis obtained. Negative for evidence of PE. Bilateral groundglass opacities seen consistent with recent COVID-19 pneumonia. Mild bilateral hydronephroureter and a 7 mm nonobstructing left renal interpolar calculus seen. Thickened appearance of the colon seen without evidence of bowel obstruction. Large hiatal hernia also noted.  Per EDP, melena present on rectal exam which is Hemoccult positive. Patient was started on broad-spectrum empiric antibiotics with IV  vancomycin, cefepime and Flagyl. Patient was given IV Protonix bolus and started on continuous infusion. Patient received 1 L normal saline. Orderfortransfuse 2 units PRBCs placed. EDP discussed with on-call GI who is aware of patient. The hospitalist service was consulted to admit for further evaluation and management.    Consultants:   GI and urology  Procedures:   Antimicrobials:   Vancomycin, cefepime, metronidazole   Subjective: Pt sleepy. Per nsg student pt ate 70% of his food this am. Pt opens eyes but falls asleep. Able to tell me his sons name but that's all he answers.  Objective: Vitals:   09/26/20 2021 09/26/20 2317 09/27/20 0437 09/27/20 0725  BP: (!) 133/47 (!) 131/57 (!) 121/45 (!) 133/52  Pulse: 64 62 68 68  Resp: 18 20 16 16   Temp: (!) 97.4 F (36.3 C) 98.8 F (37.1 C) 97.7 F (36.5 C) 98.1 F (36.7 C)  TempSrc:  Oral Oral Oral  SpO2: 98% 95% 95% 95%  Weight:      Height:        Intake/Output Summary (Last 24 hours) at 09/27/2020 0837 Last data filed at 09/27/2020 0653 Gross per 24 hour  Intake 478.49 ml  Output 2650 ml  Net -2171.51 ml   Filed Weights   09/18/20 1647  Weight: 69.9 kg    Examination:  General exam: Appears calm and comfortable , sleepy Respiratory system: Clear to auscultation. Respiratory effort normal. Cardiovascular system: S1 & S2 heard, RRR. No JVD, murmurs, rubs, gallops or clicks.  Gastrointestinal system: Abdomen is nondistended, soft and nontender. Normal bowel sounds heard. Central nervous system: unable to assess Extremities: no edema Skin:warm, dry Psychiatry: sleepy    Data Reviewed: I have personally reviewed following labs and imaging studies  CBC: Recent Labs  Lab 09/23/20 0255 09/23/20 0818 09/24/20 0743 09/25/20 0552 09/25/20 1830 09/26/20 0359 09/27/20 0405  WBC 12.3*  --  12.5* 11.0*  --  9.7 7.6  HGB 9.1*   < > 9.5* 8.6* 8.4* 7.8* 7.5*  HCT 28.2*   < > 28.3* 26.5* 25.7* 23.4* 22.3*  MCV  90.1  --  88.4 88.6  --  89.3 91.4  PLT 218  --  217 207  --  177 147*   < > = values in this interval not displayed.   Basic Metabolic Panel: Recent Labs  Lab 09/23/20 0255 09/24/20 0743 09/25/20 0552 09/26/20 0359 09/27/20 0405  NA 145 142 140 143 144  K 4.3 4.4 4.1 4.2 4.0  CL 119* 114* 115* 115* 118*  CO2 20* 20* 18* 20* 20*  GLUCOSE 119* 107* 97 94 83  BUN 45* 35* 31* 44* 44*  CREATININE 1.71* 1.35* 1.43* 2.59* 2.20*  CALCIUM 8.2* 8.1* 7.9* 7.7* 7.6*   GFR: Estimated Creatinine Clearance: 21.6 mL/min (A) (by C-G formula based on SCr of 2.2 mg/dL (H)). Liver Function Tests: No results for input(s): AST, ALT, ALKPHOS, BILITOT, PROT, ALBUMIN in the last 168 hours. No results for input(s): LIPASE, AMYLASE in the last 168 hours. No results for input(s): AMMONIA in the last 168 hours. Coagulation Profile: No results for input(s): INR, PROTIME in the last 168 hours. Cardiac Enzymes: No results for input(s): CKTOTAL, CKMB, CKMBINDEX, TROPONINI in the last 168 hours. BNP (last 3 results) No results for input(s): PROBNP in the last 8760 hours. HbA1C: No results for input(s): HGBA1C in the last 72 hours. CBG: Recent Labs  Lab 09/21/20 0730  GLUCAP 92   Lipid Profile: No results for input(s): CHOL, HDL, LDLCALC, TRIG, CHOLHDL, LDLDIRECT in the last 72 hours. Thyroid Function Tests: No results for input(s): TSH, T4TOTAL, FREET4, T3FREE, THYROIDAB in the last 72 hours. Anemia Panel: Recent Labs    09/26/20 1659  VITAMINB12 228  FOLATE 7.8  FERRITIN 205  TIBC 130*  IRON 8*   Sepsis Labs: No results for input(s): PROCALCITON, LATICACIDVEN in the last 168 hours.  Recent Results (from the past 240 hour(s))  Urine culture     Status: None   Collection Time: 09/18/20  4:50 PM   Specimen: Urine, Random  Result Value Ref Range Status   Specimen Description   Final    URINE, RANDOM Performed at [facility], [address] [place], [place] [address]     Special Requests   Final    NONE Performed at [facility], [address] [place], [place] [address]    Culture   Final    NO GROWTH Performed at [facility], [address] [place], [place] [address]    Report Status 09/20/2020 FINAL  Final  Blood culture (routine x 2)     Status: None   Collection Time: 09/18/20  4:56 PM   Specimen: BLOOD  Result Value Ref Range Status   Specimen Description BLOOD LEFT ANTECUBITAL  Final   Special Requests   Final    BOTTLES DRAWN AEROBIC AND ANAEROBIC Blood Culture adequate volume   Culture   Final    NO GROWTH 5 DAYS Performed at [facility], [address], [place], [place] [address]    Report Status 09/23/2020 FINAL  Final  Blood culture (routine x 2)     Status: None   Collection Time: 09/18/20  5:01 PM   Specimen: BLOOD  Result Value Ref Range Status   Specimen Description BLOOD BLOOD RIGHT FOREARM  Final   Special Requests   Final    BOTTLES DRAWN AEROBIC AND ANAEROBIC Blood Culture adequate volume   Culture   Final    NO GROWTH 5 DAYS Performed at [facility], [address] [place], [place] [address]    Report Status 09/23/2020 FINAL  Final  Gastrointestinal Panel by PCR , Stool     Status: None   Collection Time: 09/18/20  5:06 PM   Specimen: Stool  Result Value Ref Range Status   Campylobacter species NOT DETECTED NOT DETECTED Final   Plesimonas shigelloides NOT DETECTED NOT DETECTED Final   Salmonella species NOT DETECTED NOT DETECTED Final   Yersinia enterocolitica NOT DETECTED NOT DETECTED Final   Vibrio species NOT DETECTED NOT DETECTED Final   Vibrio cholerae NOT DETECTED NOT DETECTED Final   Enteroaggregative E coli (EAEC) NOT DETECTED NOT DETECTED Final   Enteropathogenic E coli (EPEC) NOT DETECTED NOT DETECTED Final   Enterotoxigenic E coli (ETEC) NOT DETECTED NOT DETECTED Final   Shiga like toxin producing E coli (STEC) NOT DETECTED NOT DETECTED Final    Shigella/Enteroinvasive E coli (EIEC) NOT DETECTED NOT DETECTED Final   Cryptosporidium NOT DETECTED NOT DETECTED Final   Cyclospora cayetanensis NOT DETECTED NOT DETECTED Final   Entamoeba histolytica NOT DETECTED NOT DETECTED Final   Giardia lamblia NOT DETECTED NOT DETECTED Final   Adenovirus F40/41 NOT DETECTED NOT DETECTED Final   Astrovirus NOT DETECTED NOT DETECTED Final   Norovirus GI/GII NOT DETECTED NOT DETECTED Final   Rotavirus A NOT DETECTED NOT DETECTED Final   Sapovirus (I, II, IV, and V) NOT DETECTED NOT DETECTED Final    Comment: Performed at [facility], [address] [place], [place] [address]  C Difficile Quick Screen (NO PCR Reflex)     Status: None   Collection Time: 09/18/20  5:07 PM   Specimen: Stool  Result Value Ref Range Status   C Diff antigen NEGATIVE NEGATIVE Final   C Diff toxin NEGATIVE NEGATIVE Final   C Diff interpretation No C. difficile detected.  Final    Comment: Performed at [facility], [address], [place], [place] [address]         Radiology Studies: US RENAL  Result Date: 09/26/2020 CLINICAL DATA:  Acute renal injury. EXAM: RENAL / URINARY TRACT ULTRASOUND COMPLETE COMPARISON:  CT 09/18/2020. FINDINGS: Right Kidney: Renal measurements: 10.9 x 5.3 x 5.3 cm = volume: 157.9 mL. Echogenicity within normal limits. Mild hydronephrosis. Multiple renal calyceal stones versus sloughed papilla. A renal pelvic stone measuring up to 1.2 cm cannot be excluded. It should be noted that no renal stones on the right were noted on CT of 09/18/2020. 3.6 cm right renal simple cyst again noted. Left Kidney: Renal measurements: 13.1 x 7.3 x 6.1 cm = volume: 3 of 3.4 mL. Echogenicity within normal limits. Minimal amount of left perinephric fluid. Mild left hydronephrosis. 9 mm left renal calyceal stone again noted. Bladder: Bladder is distended and contains mild debris. No ureteral jets noted. Other: None. IMPRESSION: 1. Mild bilateral  hydronephrosis. Minimal amount of left perinephric fluid noted. Bladder is distended and contains mild debris. No ureteral jets noted. 2. Multiple right renal calyceal stones versus sloughed papilla. A renal pelvic stone measuring up to 1.2 cm cannot be excluded. It should be noted that no renal stones on the right were noted on CT of 09/18/2020. 3.  9  mm left renal calyceal stone again noted. 4.  3.6 cm simple right renal cyst again noted. 5.  Mild right renal atrophy. Electronically Signed   By: Maisie Fus  Register   On: 09/26/2020 12:35        Scheduled Meds: . acidophilus  2 capsule Oral TID  . amLODipine  5 mg Oral Daily  . Chlorhexidine Gluconate Cloth  6 each Topical Daily  . donepezil  5 mg Oral QHS  . [START ON 09/28/2020] ferrous sulfate  325 mg Oral Q breakfast  . latanoprost  1 drop Both Eyes QHS  . pantoprazole  40 mg Intravenous Q12H  . polyethylene glycol  17 g Oral Daily  . senna-docusate  1 tablet Oral BID  . tamsulosin  0.4 mg Oral QPC supper  . timolol  1 drop Both Eyes Daily   Continuous Infusions: . sodium chloride Stopped (09/26/20 2155)    Assessment & Plan:   Principal Problem:   Acute blood loss anemia Active Problems:   Pneumonia due to COVID-19 virus   Dementia (HCC)   Essential hypertension   History of CVA (cerebrovascular accident)   Hypernatremia   Systemic inflammatory response syndrome (SIRS) associated with organ dysfunction (HCC)   AKI (acute kidney injury) (HCC)   Symptomatic acute blood loss anemia due to GI bleed - POA with Hbg 6.0, down from 12.0 at recent discharge.  With melena and hemoccult positive stool in ED.   Transfused2 units PRBCs on admission, 2 units 1/28. Family prefer conservative management, would consider EGD if bleeding continues. 2/2- Fe down. Will start on fesulfate po , may consider iv iron -If hemoglobin continues to drop GI will consider proceeding with any further invasive intervention after discussing with patient's  family at that time Hold aspirin and pharmacologic VTE prophylaxis Continue IV PPI Transfuse if hemoglobin less than 7    Acute urinary retention - given Cr rise today, renal U/S obtained showing bilateral hydronephrosis and distended bladder.  Ordered for Foley to be placed, start Flomax,  2/2-urology consulted input was appreciated.  Recommend leaving catheter indwelling at least 10 days and residual need to be followed closely as outpatient    Acute kidney injury - POA.  Resolved but now recurrent on 2/1 with creatinine bump from 1.43-2.59. AKI on admission was due to prerenal azotemia in setting of GI bleeding and hypovolemia and resolved with IV fluids.   Recurrent AKI appears due to obstructive uropathy.  Mgmt as above. 2/2-creatinine improving Hold NSAID, ACE inhibitor's, nephrotoxic agents  Avoid hypotension -decrease amlodipine to 5 mg daily  Continue IV fluid     Recent admission for COVID-19 pneumonia - SARS-CoV-2 PCR + 09/06/2020. Was admitted and discharged 09/12/2020, had completed remdesivir, steroids, ceftriaxone/azithromycin for secondary bacterial pneumonia. He was requiring 2 L O2 via [place] on discharge. This admission, initially was on 1.5-2 L [place] oxygen, has now been weaned to room air.  Is now off isolation and may have visitor. 2/2-keep 02 sat >92% incentive spirometer  SIRS - POA with tachypnea, leukocytosis, lactic acidosis on arrival without any infectious source identified.  Pulmonary infiltrates on xray are related to recent Covid pneumonia. C. difficile and GI pathogen panels are negative.  Was initially covered empirically with broad spectrum antibiotics (vanc, cefepime, and flagyl). --antibiotics were stopped and patient remains clinically stable, vitals improved 2/2-ucx, bcx TD negative Continue to monitor   Hypokalemia -  Stable and remained stable   Hypernatremia - Resolved with d5w.  --Off D5W --Nursing: encourage pt  to drink water, have water  available in his reach on bedside table at all times --Monitor BMP  Constipation - hx of severe constipation.  Bowel regimen per orders with scheduled senna and MiraLAX, as needed Dulcolax, hold if loose stools or frequent BM's.  If no BM in the next 24 hours please give enemas and/or suppositories.  Diarrhea RESOLVED - due to melena / GI bleeding.  Resolved. C. difficile and GI pathogen panels are negative. Viral gastroenteritis also possible.  Continue supportive care.  Hypertension - chronic, stable to mildly elevated but with lower diastolic BP's.  --Resume home amlodipine, increased to 10 mg.   --Continue to hold lisinopril with recurrent AKI.    History of CVA - Hold aspirin with ongoing GI bleed.  Resume once Hbg stable and improving.  Dementia - Chronic, no major behavioral disturbances.   Appears declining since his wife passed away per son.  --Continue donepezil    DVT prophylaxis: scd Code Status: DNR Family Communication: None at bedside  Status is: Inpatient  Remains inpatient appropriate because:IV treatments appropriate due to intensity of illness or inability to take PO   Dispo:  Patient From: Skilled Nursing Facility  Planned Disposition: Skilled Nursing Facility  Expected discharge date: 09/29/2020  Medically stable for discharge: No              LOS: 9 days   Time spent: 35 min with >50% on coc    Lynn Ito, MD [facility] Pager [phone]  If 7PM-7AM, please contact night-coverage 09/27/2020, 8:37 AM

## 2020-09-28 DIAGNOSIS — Z515 Encounter for palliative care: Secondary | ICD-10-CM | POA: Diagnosis not present

## 2020-09-28 DIAGNOSIS — D62 Acute posthemorrhagic anemia: Secondary | ICD-10-CM | POA: Diagnosis not present

## 2020-09-28 DIAGNOSIS — F0390 Unspecified dementia without behavioral disturbance: Secondary | ICD-10-CM | POA: Diagnosis not present

## 2020-09-28 DIAGNOSIS — Z7189 Other specified counseling: Secondary | ICD-10-CM | POA: Diagnosis not present

## 2020-09-28 LAB — CBC
HCT: 22.7 % — ABNORMAL LOW (ref 39.0–52.0)
Hemoglobin: 7.4 g/dL — ABNORMAL LOW (ref 13.0–17.0)
MCH: 30.2 pg (ref 26.0–34.0)
MCHC: 32.6 g/dL (ref 30.0–36.0)
MCV: 92.7 fL (ref 80.0–100.0)
Platelets: 141 10*3/uL — ABNORMAL LOW (ref 150–400)
RBC: 2.45 MIL/uL — ABNORMAL LOW (ref 4.22–5.81)
RDW: 17.2 % — ABNORMAL HIGH (ref 11.5–15.5)
WBC: 6.7 10*3/uL (ref 4.0–10.5)
nRBC: 0 % (ref 0.0–0.2)

## 2020-09-28 LAB — CREATININE, SERUM
Creatinine, Ser: 1.26 mg/dL — ABNORMAL HIGH (ref 0.61–1.24)
GFR, Estimated: 54 mL/min — ABNORMAL LOW (ref >60)

## 2020-09-28 MED ORDER — HYDROCODONE-ACETAMINOPHEN 5-325 MG PO TABS
1.0000 | ORAL_TABLET | Freq: Four times a day (QID) | ORAL | Status: DC | PRN
  Administered 2020-09-28: 1 via ORAL
  Filled 2020-09-28: qty 1

## 2020-09-28 MED ORDER — ACETAMINOPHEN 325 MG PO TABS: 650.0000 mg | ORAL_TABLET | Freq: Four times a day (QID) | ORAL | Status: DC | PRN

## 2020-09-28 MED ORDER — MORPHINE SULFATE (PF) 2 MG/ML IV SOLN: 2.0000 mg | INTRAVENOUS | Status: DC | PRN

## 2020-09-28 MED ORDER — SODIUM CHLORIDE 0.9 % IV SOLN
200.0000 mg | Freq: Once | INTRAVENOUS | Status: AC
  Administered 2020-09-28: 200 mg via INTRAVENOUS
  Filled 2020-09-28: qty 10

## 2020-09-28 NOTE — Progress Notes (Signed)
This patient is being evaluated by palliative care medicine.  The patient's family does not want any invasive treatment on the patient including endoscopies.  The patient's hemoglobin and has been stable over the last few days.  Nothing further to offer from a GI point of view.  I will sign off.  Please call if any further GI concerns or questions.  We would like to thank you for the opportunity to participate in the care of David [name].

## 2020-09-28 NOTE — Progress Notes (Signed)
Daily Progress Note   Patient Name: David [name]       Date: 09/28/2020 DOB: 12/08/1926  Age: 87 y.o. MRN#: [id number] Attending Physician: Lynn Ito, MD Primary Care Physician: Jerrilyn [facility] Admit Date: 09/18/2020  Reason for Consultation/Follow-up: Establishing goals of care  Subjective: David [name] has no complaints today, minimally communicative. Remains confused about place and situation. RN reports poor intake today - only a few bites with breakfast.   Length of Stay: 10  Current Medications: Scheduled Meds:  . acidophilus  2 capsule Oral TID  . amLODipine  5 mg Oral Daily  . Chlorhexidine Gluconate Cloth  6 each Topical Daily  . donepezil  5 mg Oral QHS  . ferrous sulfate  325 mg Oral Q breakfast  . latanoprost  1 drop Both Eyes QHS  . pantoprazole  40 mg Intravenous Q12H  . polyethylene glycol  17 g Oral Daily  . senna-docusate  1 tablet Oral BID  . tamsulosin  0.4 mg Oral QPC supper  . timolol  1 drop Both Eyes Daily    Continuous Infusions: . sodium chloride 75 mL/hr at 09/28/20 0127    PRN Meds: bisacodyl, hydrALAZINE, ondansetron **OR** ondansetron (ZOFRAN) IV  Physical Exam Constitutional:      General: He is not in acute distress.    Comments: lethargic  Pulmonary:     Effort: Pulmonary effort is normal. No respiratory distress.  Skin:    General: Skin is warm and dry.  Neurological:     Mental Status: He is disoriented.             Vital Signs: BP (!) 139/57 (BP Location: Left Arm)   Pulse 60   Temp (!) 97.5 F (36.4 C) (Oral)   Resp 18   Ht 5\' 9"  (1.753 m)   Wt 69.9 kg   SpO2 96%   BMI 22.74 kg/m  SpO2: SpO2: 96 % O2 Device: O2 Device: Room Air O2 Flow Rate: O2 Flow Rate (L/min): 2 L/min  Intake/output summary:   Intake/Output Summary  (Last 24 hours) at 09/28/2020 1354 Last data filed at 09/28/2020 1353 Gross per 24 hour  Intake 1680 ml  Output 1776 ml  Net -96 ml   LBM: Last BM Date: 09/20/20 Baseline Weight: Weight: 69.9 kg Most recent weight: Weight: 69.9 kg       Palliative Assessment/Data: PPS 20%    Flowsheet Rows   Flowsheet Row Most Recent Value  Intake Tab   Referral Department Hospitalist  Unit at Time of Referral Med/Surg Unit  Palliative Care Primary Diagnosis Other (Comment)  [GI bleed]  Date Notified 09/22/20  Palliative Care Type New Palliative care  Reason for referral Clarify Goals of Care  Date of Admission 09/18/20  Date first seen by Palliative Care 09/22/20  # of days Palliative referral response time 0 Day(s)  # of days IP prior to Palliative referral 4  Clinical Assessment   Palliative Performance Scale Score 30%  Psychosocial & Spiritual Assessment   Palliative Care Outcomes   Patient/Family meeting held? Yes  Who was at the meeting? son and DIL  Palliative Care Outcomes Clarified goals of care, Provided psychosocial or spiritual support, Provided advance  care planning      Patient Active Problem List   Diagnosis Date Noted  . Acute blood loss anemia 09/18/2020  . Dementia (HCC)   . Essential hypertension   . History of CVA (cerebrovascular accident)   . Hypernatremia   . Systemic inflammatory response syndrome (SIRS) associated with organ dysfunction (HCC)   . AKI (acute kidney injury) (HCC)   . Pneumonia due to COVID-19 virus 09/06/2020  . Acute hypoxemic respiratory failure (HCC) 09/06/2020  . Bilateral carotid artery stenosis 06/11/2017  . Dizziness 08/08/2016  . Syncopal episodes 08/08/2016    Palliative Care Assessment & Plan   HPI: 87 y.o.malewith past medical history of dementia, history of CVA, hypertension,and RAadmitted on1/24/2022with shortness of breath and diarrhea.Patient recently admitted from 09/06/2020-09/12/2020 for acute hypoxic respiratory  failure due to COVID-19 pneumonia andCAP.This admission found to have hgb of 6.C. difficile and GI pathogen panels both negative. Chest x-ray with interval improvement in bilateral pulmonary opacities from prior.Family initially declined endoscopy; however, hgb dropped again and they are now considering endoscopy for Monday. PMT consulted to discuss GOC.  Assessment: Follow up again today with patient's son and DIL, David [name] and [name]. Update provided - discussed hgb of 7.4, albumin 1.9. Discussed poor intake, ongoing weakness, lethargy, and confusion. Family again shares their desire to not put him through further medical work up. They tell me they are "leaning heavily" towards hospice care at home. We review hospice philosophy and type of support provided. We discuss option of hospice liaison speaking directly with them - they are agreeable. Social work Doctor, general practice and Dr. Marylu Lund updated.   Recommendations/Plan:  Family speaking with hospice - possibly taking him home with hospice in coming days, pending final decision following discussion with hospice liaison  Code Status:  DNR  Discharge Planning:  To Be Determined  Thank you for allowing the Palliative Medicine Team to assist in the care of this patient.   Total Time 30 minutes Prolonged Time Billed  no       Greater than 50%  of this time was spent counseling and coordinating care related to the above assessment and plan.  Gerlean Ren, DNP, [facility] Palliative Medicine Team Team Phone # [phone]  Pager [phone]

## 2020-09-28 NOTE — Progress Notes (Addendum)
[facility] Room [address] [facility]) Hospital Liaison RN note:  Received request from Harvest Dark, NP for hospice services at home after discharge. Chart and patient information under review by [facility] physician. Hospice eligibility was approved.  Spoke with the patient's son, Minerva Areola and his wife, Janett Billow over the phone to initiate education related to hospice philosophy, services and to answer any questions. They verbalized understanding and all questions were answered. Plan is for discharge to their home once DME is in place.  Family is requesting a hospital bed and OBT to be delivered to their home: address is [address], [place], [address]. Contact is Minerva Areola and his number is correct on face sheet.  Please send signed and completed DNR home with patient. Please provide prescriptions as needed to ensure ongoing symptom management.  [facility] Liaison number provided. Hospital care team is aware of above information.  Please call with any hospice related questions or concerns.  Thank you for the opportunity to participate in this patient's care.  Cyndra Numbers, RN [facility] Liaison [phone]

## 2020-09-28 NOTE — Progress Notes (Signed)
PROGRESS NOTE    David [name]  TIW:[id number] DOB: May 01, 1927 DOA: 09/18/2020 PCP: [name]    Brief Narrative:  David [name] a 87 y.o.malewith medical history significant fordementia, history of CVA, hypertension, recent admission for COVID-19 pneumonia who presents to the ED from SNF for evaluation of shortness of breath and diarrhea.History is limited from patient due to underlying dementia and is otherwise supplemented by EDP, chart review, and patient's son by phone.  Patient recently admitted from 09/06/2020-09/12/2020 for acute hypoxic respiratory failure due to COVID-19 pneumonia andCAP. Patient was treated with IV remdesivir, steroids, IV ceftriaxone/azithromycin. He was discharged on 2 L of home O2 via [place] to SNF. He was also noted to have orthostatic hypotension while in the hospital.  Patient returns to the ED from SNF due to reported shortness of breath and loose stools. Per ED triage notes patient was brought in via EMS on 100% NRB but switched to 3 L supplemental O2 via  on arrival. Patient complains of diarrhea and abdominal pain but is not able to provide further history.   Labs notable for BUN 76, creatinine 1.88 (1.08 on 09/11/2020), hemoglobin 6.0 (12.0 on 09/10/2020), WBC 26.5, hs-troponin 43, lactic acid 2.9 > 1.6. C. difficile and GI pathogen panels both negative.  Chest x-ray with interval improvement in bilateral pulmonary opacities from prior.  CTA chest/abdomen/pelvis obtained. Negative for evidence of PE. Bilateral groundglass opacities seen consistent with recent COVID-19 pneumonia. Mild bilateral hydronephroureter and a 7 mm nonobstructing left renal interpolar calculus seen. Thickened appearance of the colon seen without evidence of bowel obstruction. Large hiatal hernia also noted.  Per EDP, melena present on rectal exam which is Hemoccult positive. Patient was started on broad-spectrum empiric antibiotics with IV  vancomycin, cefepime and Flagyl. Patient was given IV Protonix bolus and started on continuous infusion. Patient received 1 L normal saline. Orderfortransfuse 2 units PRBCs placed. EDP discussed with on-call GI who is aware of patient. The hospitalist service was consulted to admit for further evaluation and management.  2/3- renal function better, cr 1.26. pt more interactive today, still confused  Consultants:   GI and urology  Procedures:   Antimicrobials:   Vancomycin, cefepime, metronidazole   Subjective: Pale, no complaints of sob, cp, abd pain  Objective: Vitals:   09/28/20 0043 09/28/20 0455 09/28/20 0813 09/28/20 1210  BP: (!) 136/48 137/63 (!) 138/49 (!) 139/57  Pulse: 69 62 67 60  Resp: 16 12 16 18   Temp: (!) 97.5 F (36.4 C) (!) 97.4 F (36.3 C) 97.6 F (36.4 C) (!) 97.5 F (36.4 C)  TempSrc: Oral Oral Oral Oral  SpO2: 98% 98% 95% 96%  Weight:      Height:        Intake/Output Summary (Last 24 hours) at 09/28/2020 1432 Last data filed at 09/28/2020 1353 Gross per 24 hour  Intake 1200 ml  Output 1776 ml  Net -576 ml   Filed Weights   09/18/20 1647  Weight: 69.9 kg    Examination:  Calm, comfortable cta no w/r/r Regular s1/s2 Soft benign +bs No edema Awake, confused, only oriented to person. Grossly intact.    Data Reviewed: I have personally reviewed following labs and imaging studies  CBC: Recent Labs  Lab 09/24/20 0743 09/25/20 0552 09/25/20 1830 09/26/20 0359 09/27/20 0405 09/28/20 0442  WBC 12.5* 11.0*  --  9.7 7.6 6.7  HGB 9.5* 8.6* 8.4* 7.8* 7.5* 7.4*  HCT 28.3* 26.5* 25.7* 23.4* 22.3* 22.7*  MCV 88.4  88.6  --  89.3 91.4 92.7  PLT 217 207  --  177 147* 141*   Basic Metabolic Panel: Recent Labs  Lab 09/23/20 0255 09/24/20 0743 09/25/20 0552 09/26/20 0359 09/27/20 0405 09/28/20 0442  NA 145 142 140 143 144  --   K 4.3 4.4 4.1 4.2 4.0  --   CL 119* 114* 115* 115* 118*  --   CO2 20* 20* 18* 20* 20*  --   GLUCOSE  119* 107* 97 94 83  --   BUN 45* 35* 31* 44* 44*  --   CREATININE 1.71* 1.35* 1.43* 2.59* 2.20* 1.26*  CALCIUM 8.2* 8.1* 7.9* 7.7* 7.6*  --    GFR: Estimated Creatinine Clearance: 37.8 mL/min (A) (by C-G formula based on SCr of 1.26 mg/dL (H)). Liver Function Tests: No results for input(s): AST, ALT, ALKPHOS, BILITOT, PROT, ALBUMIN in the last 168 hours. No results for input(s): LIPASE, AMYLASE in the last 168 hours. No results for input(s): AMMONIA in the last 168 hours. Coagulation Profile: No results for input(s): INR, PROTIME in the last 168 hours. Cardiac Enzymes: No results for input(s): CKTOTAL, CKMB, CKMBINDEX, TROPONINI in the last 168 hours. BNP (last 3 results) No results for input(s): PROBNP in the last 8760 hours. HbA1C: No results for input(s): HGBA1C in the last 72 hours. CBG: No results for input(s): GLUCAP in the last 168 hours. Lipid Profile: No results for input(s): CHOL, HDL, LDLCALC, TRIG, CHOLHDL, LDLDIRECT in the last 72 hours. Thyroid Function Tests: No results for input(s): TSH, T4TOTAL, FREET4, T3FREE, THYROIDAB in the last 72 hours. Anemia Panel: Recent Labs    09/26/20 1659  VITAMINB12 228  FOLATE 7.8  FERRITIN 205  TIBC 130*  IRON 8*   Sepsis Labs: No results for input(s): PROCALCITON, LATICACIDVEN in the last 168 hours.  Recent Results (from the past 240 hour(s))  Urine culture     Status: None   Collection Time: 09/18/20  4:50 PM   Specimen: Urine, Random  Result Value Ref Range Status   Specimen Description   Final    URINE, RANDOM Performed at [facility], [address], [place], [place] [address]    Special Requests   Final    NONE Performed at [facility], [address] [place], [place] [address]    Culture   Final    NO GROWTH Performed at [facility], [address], [place], [place] [address]    Report Status 09/20/2020 FINAL  Final  Blood culture (routine x 2)     Status: None   Collection  Time: 09/18/20  4:56 PM   Specimen: BLOOD  Result Value Ref Range Status   Specimen Description BLOOD LEFT ANTECUBITAL  Final   Special Requests   Final    BOTTLES DRAWN AEROBIC AND ANAEROBIC Blood Culture adequate volume   Culture   Final    NO GROWTH 5 DAYS Performed at [facility], [address], [place], [place] [address]    Report Status 09/23/2020 FINAL  Final  Blood culture (routine x 2)     Status: None   Collection Time: 09/18/20  5:01 PM   Specimen: BLOOD  Result Value Ref Range Status   Specimen Description BLOOD BLOOD RIGHT FOREARM  Final   Special Requests   Final    BOTTLES DRAWN AEROBIC AND ANAEROBIC Blood Culture adequate volume   Culture   Final    NO GROWTH 5 DAYS Performed at [facility],  [address], [place], [place] [address]    Report Status 09/23/2020 FINAL  Final  Gastrointestinal Panel by PCR , Stool     Status: None   Collection Time: 09/18/20  5:06 PM   Specimen: Stool  Result Value Ref Range Status   Campylobacter species NOT DETECTED NOT DETECTED Final   Plesimonas shigelloides NOT DETECTED NOT DETECTED Final   Salmonella species NOT DETECTED NOT DETECTED Final   Yersinia enterocolitica NOT DETECTED NOT DETECTED Final   Vibrio species NOT DETECTED NOT DETECTED Final   Vibrio cholerae NOT DETECTED NOT DETECTED Final   Enteroaggregative E coli (EAEC) NOT DETECTED NOT DETECTED Final   Enteropathogenic E coli (EPEC) NOT DETECTED NOT DETECTED Final   Enterotoxigenic E coli (ETEC) NOT DETECTED NOT DETECTED Final   Shiga like toxin producing E coli (STEC) NOT DETECTED NOT DETECTED Final   Shigella/Enteroinvasive E coli (EIEC) NOT DETECTED NOT DETECTED Final   Cryptosporidium NOT DETECTED NOT DETECTED Final   Cyclospora cayetanensis NOT DETECTED NOT DETECTED Final   Entamoeba histolytica NOT DETECTED NOT DETECTED Final   Giardia lamblia NOT DETECTED NOT DETECTED Final   Adenovirus F40/41 NOT DETECTED NOT DETECTED Final    Astrovirus NOT DETECTED NOT DETECTED Final   Norovirus GI/GII NOT DETECTED NOT DETECTED Final   Rotavirus A NOT DETECTED NOT DETECTED Final   Sapovirus (I, II, IV, and V) NOT DETECTED NOT DETECTED Final    Comment: Performed at [facility], [address] [place], [place] [address]  C Difficile Quick Screen (NO PCR Reflex)     Status: None   Collection Time: 09/18/20  5:07 PM   Specimen: Stool  Result Value Ref Range Status   C Diff antigen NEGATIVE NEGATIVE Final   C Diff toxin NEGATIVE NEGATIVE Final   C Diff interpretation No C. difficile detected.  Final    Comment: Performed at [facility], [address] [place], [place] [address]  MRSA PCR Screening     Status: None   Collection Time: 09/27/20  8:17 PM   Specimen: Nasopharyngeal  Result Value Ref Range Status   MRSA by PCR NEGATIVE NEGATIVE Final    Comment:        The GeneXpert MRSA Assay (FDA approved for NASAL specimens only), is one component of a comprehensive MRSA colonization surveillance program. It is not intended to diagnose MRSA infection nor to guide or monitor treatment for MRSA infections. Performed at [facility], [address] [place], [place] [address]          Radiology Studies: No results found.      Scheduled Meds: . acidophilus  2 capsule Oral TID  . amLODipine  5 mg Oral Daily  . Chlorhexidine Gluconate Cloth  6 each Topical Daily  . donepezil  5 mg Oral QHS  . ferrous sulfate  325 mg Oral Q breakfast  . latanoprost  1 drop Both Eyes QHS  . pantoprazole  40 mg Intravenous Q12H  . polyethylene glycol  17 g Oral Daily  . senna-docusate  1 tablet Oral BID  . tamsulosin  0.4 mg Oral QPC supper  . timolol  1 drop Both Eyes Daily   Continuous Infusions: . sodium chloride 75 mL/hr at 09/28/20 0127  . iron sucrose      Assessment & Plan:   Principal Problem:   Acute blood loss anemia Active Problems:   Pneumonia due to COVID-19 virus    Dementia [facility])   Essential hypertension  History of CVA (cerebrovascular accident)   Hypernatremia   Systemic inflammatory response syndrome (SIRS) associated with organ dysfunction (HCC)   AKI (acute kidney injury) (HCC)   Symptomatic acute blood loss anemia due to GI bleed - POA with Hbg 6.0, down from 12.0 at recent discharge.  With melena and hemoccult positive stool in ED.   Transfused2 units PRBCs on admission, 2 units 1/28. Family prefer conservative management, would consider EGD if bleeding continues. 2/2- Fe down. Will start on fesulfate po , may consider iv iron 2/3-spoke to daughter in law about iron levels, family wants iron infusion.  Will transfuse iron iv today No further GI w/u Family deciding on hospice still. Hold asa and phamacologic VTE ppx for now Continue PPI IV Transfuse hg <7     Acute urinary retention - given Cr rise today, renal U/S obtained showing bilateral hydronephrosis and distended bladder.  Ordered for Foley to be placed, start Flomax,  2/3- urology rec. Leaving foley in at least 10 days and residual need to be followed closely as outpatient     Acute kidney injury - POA.  Resolved but now recurrent on 2/1 with creatinine bump from 1.43-2.59. AKI on admission was due to prerenal azotemia in setting of GI bleeding and hypovolemia and resolved with IV fluids.   Recurrent AKI appears due to obstructive uropathy.  Mgmt as above. 2/3- creatinine improving, today is 1.26 Hold NSAID, ACEI, nephrotoxic agents Avoid hypotension Continue ivf since decrease po intake      Recent admission for COVID-19 pneumonia - SARS-CoV-2 PCR + 09/06/2020. Was admitted and discharged 09/12/2020, had completed remdesivir, steroids, ceftriaxone/azithromycin for secondary bacterial pneumonia. He was requiring 2 L O2 via [place] on discharge. This admission, initially was on 1.5-2 L [place] oxygen, has now been weaned to room air.  Is now off isolation and may have  visitor. 2/3- keep 02 sat >92% Incentive spirometer    SIRS - POA with tachypnea, leukocytosis, lactic acidosis on arrival without any infectious source identified.  Pulmonary infiltrates on xray are related to recent Covid pneumonia. C. difficile and GI pathogen panels are negative.  Was initially covered empirically with broad spectrum antibiotics (vanc, cefepime, and flagyl). --antibiotics were stopped and patient remains clinically stable, vitals improved 2/3-ucx/bcx so far NTD    Hypokalemia -  Stable now   Hypernatremia - Resolved with d5w.  --Off D5W --Nursing: encourage pt to drink water, have water available in his reach on bedside table at all times --Monitor BMP  Constipation - hx of severe constipation.  Bowel regimen per orders with scheduled senna and MiraLAX, as needed Dulcolax, hold if loose stools or frequent BM's.  If no BM in the next 24 hours please give enemas and/or suppositories.  Diarrhea RESOLVED - due to melena / GI bleeding.  Resolved. C. difficile and GI pathogen panels are negative. Viral gastroenteritis also possible.  Continue supportive care.  Hypertension - chronic, stable to mildly elevated but with lower diastolic BP's.  --Resume home amlodipine, increased to 10 mg.   --Continue to hold lisinopril with recurrent AKI.    History of CVA - Hold aspirin with ongoing GI bleed.  Resume once Hbg stable and improving.  Dementia - Chronic, no major behavioral disturbances.   Appears declining since his wife passed away per son.  --Continue donepezil    DVT prophylaxis: scd Code Status: DNR Family Communication: None at bedside  Status is: Inpatient  Remains inpatient appropriate because:IV treatments appropriate due to intensity of illness  or inability to take PO   Dispo:  Patient From: Skilled Nursing Facility  Planned Disposition: Skilled Nursing Facilityvs home with hospice  Expected discharge date: 09/29/2020  Medically stable for  discharge: No              LOS: 10 days   Time spent: 35 min with >50% on coc    Lynn Ito, MD [facility] Pager [phone]  If 7PM-7AM, please contact night-coverage 09/28/2020, 2:32 PM

## 2020-09-28 NOTE — TOC Progression Note (Signed)
Transition of Care [facility]) - Progression Note    Patient Details  Name: David [name] MRN: [id number] Date of Birth: 17-Aug-1927  [facility] CM/SW Contact  Margarito Liner, LCSW Phone Number: 09/28/2020, 2:58 PM  Clinical Narrative: Per palliative, son has questions about home hospice services. CSW called son and discussed. No agency preference. He wants questions answered before he commits to hospice. Cyndra Numbers, RN with [facility] will call him. Son said he needs to clean out room for DME and doesn't think it can be done until the weekend. He knows patient will need a hospital bed but is unsure about anything else. Boyd Kerbs is aware and will discuss with him.    Expected Discharge Plan: Skilled Nursing Facility Barriers to Discharge: Continued Medical Work up  Expected Discharge Plan and Services Expected Discharge Plan: Skilled Nursing Facility       Living arrangements for the past 2 months: Single Family Home                                       Social Determinants of Health (SDOH) Interventions    Readmission Risk Interventions No flowsheet data found.

## 2020-09-29 DIAGNOSIS — D62 Acute posthemorrhagic anemia: Secondary | ICD-10-CM | POA: Diagnosis not present

## 2020-09-29 MED ORDER — PANTOPRAZOLE SODIUM 40 MG PO TBEC: 40.0000 mg | DELAYED_RELEASE_TABLET | Freq: Every day | ORAL | Status: DC

## 2020-09-29 MED ORDER — PANTOPRAZOLE SODIUM 40 MG PO TBEC: 40.0000 mg | DELAYED_RELEASE_TABLET | Freq: Every day | ORAL | 1 refills | Status: AC

## 2020-09-29 MED ORDER — FERROUS SULFATE 325 (65 FE) MG PO TABS: 325.0000 mg | ORAL_TABLET | Freq: Every day | ORAL | 0 refills | Status: AC

## 2020-09-29 NOTE — Care Management Important Message (Signed)
Important Message  Patient Details  Name: David [name] MRN: [id number] Date of Birth: 1927/06/02   Medicare Important Message Given:  Yes     [name] 09/29/2020, 11:38 AM

## 2020-09-29 NOTE — Discharge Summary (Addendum)
David [name] ZOX:[id number] DOB: [birth year] DOA: 09/18/2020  PCP: [facility]  Admit date: 09/18/2020 Discharge date: 09/29/2020  Admitted From: [facility] Disposition: Home with hospice  Recommendations for Outpatient Follow-up:  1. Follow up with PCP in 1 week 2. Please obtain BMP/CBC in one week   Home Health: Yes   Discharge Condition:Stable CODE STATUS: DNR Diet recommendation: Dysphagia 2 diet    Brief/Interim Summary: Per HPI: David [name] is a 87 y.o. male with medical history significant for dementia, history of CVA, hypertension, recent admission for COVID-19 pneumonia who presents to the ED from SNF for evaluation of shortness of breath and diarrhea.Patient recently admitted from 09/06/2020-09/12/2020 for acute hypoxic respiratory failure due to COVID-19 pneumonia and CAP.  Patient was treated with IV remdesivir, steroids, IV ceftriaxone/azithromycin.  He was discharged on 2 L of home O2 via [place] to SNF.  He was also noted to have orthostatic hypotension while in the hospital.  Patient returned to the ED from SNF due to reported shortness of breath and loose stools. Per ED triage noted patient was brought in via EMS on 100% NRB but switched to 3 L supplemental O2 via [place] on arrival. Patient complains of diarrhea and abdominal pain but was not able to provide further history. Lab on admission noted sodium 148, potassium 4.1, chloride 115, bicarb 20, BUN 76, creatinine 1.88 (1.08 on 09/11/2020), serum glucose 163, hemoglobin 6.0 (12.0 on 09/10/2020), WBC 26.5, platelets 420,000, high-sensitivity troponin I 43, lactic acid 2.9 > 1.6.  CTA chest/abdomen/pelvis obtained.  This is negative for evidence of PE.  Bilateral groundglass opacities seen consistent with recent COVID-19 pneumonia.  Mild bilateral hydronephroureter and a 7 mm nonobstructing left renal interpolar calculus seen.  Thickened appearance of the colon seen without evidence of bowel obstruction.  Large  hiatal hernia also noted.  Per EDP, melena present on rectal exam which is Hemoccult positive.  Patient was started on broad-spectrum empiric antibiotics with IV vancomycin, cefepime and Flagyl.  Patient was given IV Protonix bolus and started on continuous infusion.  Patient received 1 L normal saline.  Order to transfuse 2 units PRBCs placed. EDP discussed with on-call GI who was aware of patient. The hospitalist service was consulted to admit for further evaluation and management.  Symptomatic acute blood loss anemia due to GI bleed- POA with Hbg 6.0, down from 12.0 at recent discharge. With melena and hemoccult positive stool in ED.  Transfused2 units PRBCs on admission, 2 units 1/28. Family prefer conservative management, would consider EGD if bleeding continues. Iron studies revealed low iron, patient was started on iron p.o. and after discussion with daughter-in-law he was given iron transfusion IV x1 His aspirin and pharmacologic VTE prophylaxis was held due to GI bleed Family has decided to move forward with home hospice He was placed on IV PPI and will be switched to p.o. on discharge      Acute urinary retention- given Cr rise renal U/S obtained showing bilateral hydronephrosis and distended bladder. Ordered for Foley to be placed, start Flomax,  Urology was consulted and they recommended  Leaving foley in at least 10 days and residual need to be followed closely as outpatient     Acute kidney injury- Improved with creatinine at 1.23 AKI on admission was due to prerenal azotemia in setting of GI bleeding and hypovolemia Was tx with IVF and placing foley as obstructive uropathy was also attributing to it too Hold NSAID, ACEI, nephrotoxic agents  Recent admission for COVID-19 pneumonia- SARS-CoV-2 PCR + 09/06/2020. Was admitted and discharged 09/12/2020, had completed remdesivir, steroids, ceftriaxone/azithromycin for secondary bacterial pneumonia.  He was requiring 2 L O2 via [place] on discharge. This admission, initially was on 1.5-2 L [place] oxygen, has now been weaned to room air.      SIRS- POA with tachypnea, leukocytosis, lactic acidosis on arrival without any infectious source identified. Pulmonary infiltrates on xray are related to recent Covid pneumonia. C. difficile and GI pathogen panels are negative. Was initially covered empirically with broad spectrum antibiotics (vanc, cefepime, and flagyl). --antibiotics were stopped and patient remains clinically stable, vitals improved ucx/bcx so far NTD    Hypokalemia -  Stable   Hypernatremia- Resolved with d5w.  Due to hypovolemia  Constipation- hx of severe constipation.    Diarrhea RESOLVED- due to melena / GI bleeding. Resolved. C. difficile and GI pathogen panels are negative.    Hypertension- essential, chronic Continue with amlodipine  History of CVA- Hold aspirin due GI bleed. If hg stable as outpatient , can reconsider.  Dementia- Chronic, no major behavioral disturbances.  Appears declining since his wife passed away per son.  --Continue donepezil      Discharge Diagnoses:  Principal Problem:   Acute blood loss anemia Active Problems:   Pneumonia due to COVID-19 virus   Dementia (HCC)   Essential hypertension   History of CVA (cerebrovascular accident)   Hypernatremia   Systemic inflammatory response syndrome (SIRS) associated with organ dysfunction (HCC)   AKI (acute kidney injury) [facility])    Discharge Instructions  Discharge Instructions    Diet - low sodium heart healthy   Complete by: As directed    Increase activity slowly   Complete by: As directed      Allergies as of 09/29/2020   No Known Allergies     Medication List    STOP taking these medications   aspirin EC 81 MG tablet   dextromethorphan-guaiFENesin 30-600 MG 12hr tablet Commonly known as: MUCINEX DM   lisinopril 5 MG tablet Commonly known as:  ZESTRIL   polyethylene glycol 17 g packet Commonly known as: MIRALAX / GLYCOLAX     TAKE these medications   acetaminophen 325 MG tablet Commonly known as: TYLENOL Take 2 tablets (650 mg total) by mouth every 6 (six) hours as needed for mild pain or headache (fever >/= 101).   amLODipine 5 MG tablet Commonly known as: NORVASC Take 5 mg by mouth daily.   bisacodyl 5 MG EC tablet Commonly known as: DULCOLAX Take 1 tablet (5 mg total) by mouth daily as needed for moderate constipation. What changed: Another medication with the same name was removed. Continue taking this medication, and follow the directions you see here.   donepezil 5 MG tablet Commonly known as: ARICEPT Take 1 tablet (5 mg total) by mouth at bedtime.   ferrous sulfate 325 (65 FE) MG tablet Take 1 tablet (325 mg total) by mouth daily with breakfast. Start taking on: September 30, 2020   Ipratropium-Albuterol 20-100 MCG/ACT Aers respimat Commonly known as: COMBIVENT Inhale 1 puff into the lungs every 6 (six) hours.   latanoprost 0.005 % ophthalmic solution Commonly known as: XALATAN Place 1 drop into both eyes at bedtime.   melatonin 3 MG Tabs tablet Take 1 tablet (3 mg total) by mouth at bedtime.   pantoprazole 40 MG tablet Commonly known as: PROTONIX Take 1 tablet (40 mg total) by mouth daily.   senna-docusate 8.6-50 MG tablet Commonly known  as: Senokot-S Take 1 tablet by mouth at bedtime. Hold if frequent BM's or loose stools   tamsulosin 0.4 MG Caps capsule Commonly known as: FLOMAX Take 1 capsule (0.4 mg total) by mouth daily after supper.   timolol 0.5 % ophthalmic solution Commonly known as: TIMOPTIC Place 1 drop into both eyes daily.            Durable Medical Equipment  (From admission, onward)         Start     Ordered   09/29/20 0816  For home use only DME Hospital bed  Once       Question Answer Comment  Length of Need Lifetime   The above medical condition requires: Patient  requires the ability to reposition frequently   Head must be elevated greater than: 45 degrees   Bed type Semi-electric   Hoyer Lift Yes   Trapeze Bar Yes   Support Surface: Alternating Pressure Pad and Pump      09/29/20 0816          Follow-up Information    Stoioff, Verna Czech, MD Follow up in 10 day(s).   Specialty: Urology Contact information: [address] [place] [address] [phone]        [facility] Follow up in 1 week(s).   Contact information: [address] [place] [address] [phone]              No Known Allergies  Consultations:  GI   Procedures/Studies: CT Angio Chest PE W and/or Wo Contrast  Result Date: 09/18/2020 CLINICAL DATA:  87 year old male with recent diagnosis of COVID-19. Concern for pulmonary embolism. Diarrhea. EXAM: CT ANGIOGRAPHY CHEST CT ABDOMEN AND PELVIS WITH CONTRAST TECHNIQUE: Multidetector CT imaging of the chest was performed using the standard protocol during bolus administration of intravenous contrast. Multiplanar CT image reconstructions and MIPs were obtained to evaluate the vascular anatomy. Multidetector CT imaging of the abdomen and pelvis was performed using the standard protocol during bolus administration of intravenous contrast. CONTRAST:  73mL OMNIPAQUE IOHEXOL 350 MG/ML SOLN COMPARISON:  Chest CT dated 02/24/2008 and CT abdomen pelvis dated 09/27/2013. FINDINGS: CTA CHEST FINDINGS Cardiovascular: There is no cardiomegaly or pericardial effusion. Three-vessel coronary vascular calcification. Advanced atherosclerotic calcification of the thoracic aorta. No aneurysmal dilatation or dissection. Evaluation of the pulmonary arteries is limited due to respiratory motion artifact. No pulmonary artery embolus identified. Mediastinum/Nodes: There is no hilar or mediastinal adenopathy. There is a large hiatal hernia. The esophagus is grossly unremarkable. No mediastinal fluid collection.  Lungs/Pleura: Background of emphysema. Bilateral confluent ground-glass opacities, right greater left most consistent with multifocal pneumonia and in keeping with provided history of COVID-19. Clinical correlation is recommended. There is no pleural effusion pneumothorax. The central airways are patent. Musculoskeletal: Osteopenia with degenerative changes of the spine. Old healed right rib fractures. No acute osseous pathology. Review of the MIP images confirms the above findings. CT ABDOMEN and PELVIS FINDINGS No intra-abdominal free air or free fluid. Hepatobiliary: Multiple hepatic hypodense lesions measure up to 2.3 cm in the left lobe of the liver. The larger lesions demonstrate fluid attenuation consistent with cysts and smaller lesions are too small to characterize. No intrahepatic biliary dilatation. The gallbladder is unremarkable. Pancreas: Unremarkable. No pancreatic ductal dilatation or surrounding inflammatory changes. Spleen: Normal in size without focal abnormality. Adrenals/Urinary Tract: The adrenal glands unremarkable. There is mild bilateral hydronephroureter. There is a 2.5 cm right renal cyst. There is a 7 mm  nonobstructing left renal interpolar calculus. There is mild right perinephric stranding with apparent enhancement of the urothelium of the right renal collecting system and ureter right ureter. Correlation with urinalysis recommended to evaluate for UTI. The urinary bladder is distended. Linear calcific density along the right posterior bladder wall may represent layering stone or focal calcification of the bladder wall. Stomach/Bowel: There is a large hiatal hernia. There is no bowel obstruction. There is loose stool throughout the colon consistent with provided history of diarrheal state. Thickened appearance of the colon may be related to underdistention or represent mild colitis. Clinical correlation is recommended. The appendix is normal. Vascular/Lymphatic: Advanced aortoiliac  atherosclerotic disease. The IVC is unremarkable. No portal venous gas. There is no adenopathy. Reproductive: The prostate and seminal vesicles are grossly unremarkable. Other: None Musculoskeletal: Osteopenia with degenerative changes of the spine and scoliosis. Lower lumbar posterior fusion. No acute osseous pathology. Review of the MIP images confirms the above findings. IMPRESSION: 1. No CT evidence of pulmonary embolism. 2. Multifocal pneumonia in keeping with provided history of COVID-19. Clinical correlation is recommended. 3. Mild bilateral hydronephroureter. Correlation with urinalysis recommended to evaluate for UTI. 4. A 7 mm nonobstructing left renal interpolar calculus. 5. Diarrheal state. Thickened appearance of the colon may be related to underdistention or represent mild colitis. Clinical correlation is recommended. No bowel obstruction. Normal appendix. 6. Large hiatal hernia. 7. Aortic Atherosclerosis (ICD10-I70.0) and Emphysema (ICD10-J43.9). Electronically Signed   By: Elgie Collard M.D.   On: 09/18/2020 19:12   CT ABDOMEN PELVIS W CONTRAST  Result Date: 09/18/2020 CLINICAL DATA:  87 year old male with recent diagnosis of COVID-19. Concern for pulmonary embolism. Diarrhea. EXAM: CT ANGIOGRAPHY CHEST CT ABDOMEN AND PELVIS WITH CONTRAST TECHNIQUE: Multidetector CT imaging of the chest was performed using the standard protocol during bolus administration of intravenous contrast. Multiplanar CT image reconstructions and MIPs were obtained to evaluate the vascular anatomy. Multidetector CT imaging of the abdomen and pelvis was performed using the standard protocol during bolus administration of intravenous contrast. CONTRAST:  60mL OMNIPAQUE IOHEXOL 350 MG/ML SOLN COMPARISON:  Chest CT dated 02/24/2008 and CT abdomen pelvis dated 09/27/2013. FINDINGS: CTA CHEST FINDINGS Cardiovascular: There is no cardiomegaly or pericardial effusion. Three-vessel coronary vascular calcification. Advanced  atherosclerotic calcification of the thoracic aorta. No aneurysmal dilatation or dissection. Evaluation of the pulmonary arteries is limited due to respiratory motion artifact. No pulmonary artery embolus identified. Mediastinum/Nodes: There is no hilar or mediastinal adenopathy. There is a large hiatal hernia. The esophagus is grossly unremarkable. No mediastinal fluid collection. Lungs/Pleura: Background of emphysema. Bilateral confluent ground-glass opacities, right greater left most consistent with multifocal pneumonia and in keeping with provided history of COVID-19. Clinical correlation is recommended. There is no pleural effusion pneumothorax. The central airways are patent. Musculoskeletal: Osteopenia with degenerative changes of the spine. Old healed right rib fractures. No acute osseous pathology. Review of the MIP images confirms the above findings. CT ABDOMEN and PELVIS FINDINGS No intra-abdominal free air or free fluid. Hepatobiliary: Multiple hepatic hypodense lesions measure up to 2.3 cm in the left lobe of the liver. The larger lesions demonstrate fluid attenuation consistent with cysts and smaller lesions are too small to characterize. No intrahepatic biliary dilatation. The gallbladder is unremarkable. Pancreas: Unremarkable. No pancreatic ductal dilatation or surrounding inflammatory changes. Spleen: Normal in size without focal abnormality. Adrenals/Urinary Tract: The adrenal glands unremarkable. There is mild bilateral hydronephroureter. There is a 2.5 cm right renal cyst. There is a 7 mm nonobstructing left renal interpolar calculus.  There is mild right perinephric stranding with apparent enhancement of the urothelium of the right renal collecting system and ureter right ureter. Correlation with urinalysis recommended to evaluate for UTI. The urinary bladder is distended. Linear calcific density along the right posterior bladder wall may represent layering stone or focal calcification of the  bladder wall. Stomach/Bowel: There is a large hiatal hernia. There is no bowel obstruction. There is loose stool throughout the colon consistent with provided history of diarrheal state. Thickened appearance of the colon may be related to underdistention or represent mild colitis. Clinical correlation is recommended. The appendix is normal. Vascular/Lymphatic: Advanced aortoiliac atherosclerotic disease. The IVC is unremarkable. No portal venous gas. There is no adenopathy. Reproductive: The prostate and seminal vesicles are grossly unremarkable. Other: None Musculoskeletal: Osteopenia with degenerative changes of the spine and scoliosis. Lower lumbar posterior fusion. No acute osseous pathology. Review of the MIP images confirms the above findings. IMPRESSION: 1. No CT evidence of pulmonary embolism. 2. Multifocal pneumonia in keeping with provided history of COVID-19. Clinical correlation is recommended. 3. Mild bilateral hydronephroureter. Correlation with urinalysis recommended to evaluate for UTI. 4. A 7 mm nonobstructing left renal interpolar calculus. 5. Diarrheal state. Thickened appearance of the colon may be related to underdistention or represent mild colitis. Clinical correlation is recommended. No bowel obstruction. Normal appendix. 6. Large hiatal hernia. 7. Aortic Atherosclerosis (ICD10-I70.0) and Emphysema (ICD10-J43.9). Electronically Signed   By: Elgie Collard M.D.   On: 09/18/2020 19:12   US RENAL  Result Date: 09/26/2020 CLINICAL DATA:  Acute renal injury. EXAM: RENAL / URINARY TRACT ULTRASOUND COMPLETE COMPARISON:  CT 09/18/2020. FINDINGS: Right Kidney: Renal measurements: 10.9 x 5.3 x 5.3 cm = volume: 157.9 mL. Echogenicity within normal limits. Mild hydronephrosis. Multiple renal calyceal stones versus sloughed papilla. A renal pelvic stone measuring up to 1.2 cm cannot be excluded. It should be noted that no renal stones on the right were noted on CT of 09/18/2020. 3.6 cm right renal  simple cyst again noted. Left Kidney: Renal measurements: 13.1 x 7.3 x 6.1 cm = volume: 3 of 3.4 mL. Echogenicity within normal limits. Minimal amount of left perinephric fluid. Mild left hydronephrosis. 9 mm left renal calyceal stone again noted. Bladder: Bladder is distended and contains mild debris. No ureteral jets noted. Other: None. IMPRESSION: 1. Mild bilateral hydronephrosis. Minimal amount of left perinephric fluid noted. Bladder is distended and contains mild debris. No ureteral jets noted. 2. Multiple right renal calyceal stones versus sloughed papilla. A renal pelvic stone measuring up to 1.2 cm cannot be excluded. It should be noted that no renal stones on the right were noted on CT of 09/18/2020. 3.  9 mm left renal calyceal stone again noted. 4.  3.6 cm simple right renal cyst again noted. 5.  Mild right renal atrophy. Electronically Signed   By: Maisie Fus  Register   On: 09/26/2020 12:35   DG Chest Portable 1 View  Result Date: 09/18/2020 CLINICAL DATA:  87 year old male with shortness of breath. EXAM: PORTABLE CHEST 1 VIEW COMPARISON:  Chest radiograph dated 09/06/2020. FINDINGS: Interval improvement in bilateral pulmonary opacities with residual streaky densities which may represent residual infiltrate or post inflammatory changes. No consolidative changes. There is no pleural effusion pneumothorax. Stable cardiomegaly. Atherosclerotic calcification of the aorta. Moderate size hiatal hernia. No acute osseous pathology. IMPRESSION: Interval improvement in bilateral pulmonary opacities with residual streaky densities which may represent residual infiltrate or post inflammatory changes. Electronically Signed   By: Ceasar Mons.D.  On: 09/18/2020 17:38   DG Chest Portable 1 View  Result Date: 09/06/2020 CLINICAL DATA:  Hypoxia.  Weakness. EXAM: PORTABLE CHEST 1 VIEW COMPARISON:  08/08/2016 FINDINGS: Normal heart size. Aortic atherosclerosis. Bilateral airspace opacities are identified, most  notable within the right upper lobe and left lower lobe. No pleural effusion or edema. IMPRESSION: Bilateral airspace opacities compatible with multifocal infection. Followup PA and lateral chest X-ray is recommended in 3-4 weeks following trial of antibiotic therapy to ensure resolution and exclude underlying malignancy. Electronically Signed   By: Signa Kell M.D.   On: 09/06/2020 12:01      Subjective: No issues  Discharge Exam: Vitals:   09/29/20 0827 09/29/20 1107  BP: (!) 171/116 (!) 131/55  Pulse: 65 61  Resp: 16 19  Temp: 97.7 F (36.5 C)   SpO2: 100% 96%   Vitals:   09/28/20 2052 09/29/20 0411 09/29/20 0827 09/29/20 1107  BP: (!) 151/77 (!) 148/59 (!) 171/116 (!) 131/55  Pulse: 70 71 65 61  Resp: Temp: 98.3 F (36.8 C) 97.7 F (36.5 C) 97.7 F (36.5 C)   TempSrc: Oral Oral Oral   SpO2:  97% 100% 96%  Weight:      Height:        General: Pt is , awake, not in acute distress, confused Cardiovascular: RRR, S1/S2 +, no rubs, no gallops Respiratory: CTA bilaterally, no wheezing, no rhonchi Abdominal: Soft, NT, ND, bowel sounds + Extremities: no edema, no cyanosis    The results of significant diagnostics from this hospitalization (including imaging, microbiology, ancillary and laboratory) are listed below for reference.     Microbiology: Recent Results (from the past 240 hour(s))  MRSA PCR Screening     Status: None   Collection Time: 09/27/20  8:17 PM   Specimen: Nasopharyngeal  Result Value Ref Range Status   MRSA by PCR NEGATIVE NEGATIVE Final    Comment:        The GeneXpert MRSA Assay (FDA approved for NASAL specimens only), is one component of a comprehensive MRSA colonization surveillance program. It is not intended to diagnose MRSA infection nor to guide or monitor treatment for MRSA infections. Performed at [facility], [address] [place], [place] [address]      Labs: BNP (last 3 results) Recent Labs     09/12/20 0344 09/18/20 1655  BNP 84.7 70.9   Basic Metabolic Panel: Recent Labs  Lab 09/23/20 0255 09/24/20 0743 09/25/20 0552 09/26/20 0359 09/27/20 0405 09/28/20 0442  NA 145 142 140 143 144  --   K 4.3 4.4 4.1 4.2 4.0  --   CL 119* 114* 115* 115* 118*  --   CO2 20* 20* 18* 20* 20*  --   GLUCOSE 119* 107* 97 94 83  --   BUN 45* 35* 31* 44* 44*  --   CREATININE 1.71* 1.35* 1.43* 2.59* 2.20* 1.26*  CALCIUM 8.2* 8.1* 7.9* 7.7* 7.6*  --    Liver Function Tests: No results for input(s): AST, ALT, ALKPHOS, BILITOT, PROT, ALBUMIN in the last 168 hours. No results for input(s): LIPASE, AMYLASE in the last 168 hours. No results for input(s): AMMONIA in the last 168 hours. CBC: Recent Labs  Lab 09/24/20 0743 09/25/20 0552 09/25/20 1830 09/26/20 0359 09/27/20 0405 09/28/20 0442  WBC 12.5* 11.0*  --  9.7 7.6 6.7  HGB 9.5* 8.6* 8.4* 7.8* 7.5* 7.4*  HCT 28.3* 26.5* 25.7* 23.4* 22.3* 22.7*  MCV 88.4 88.6  --  89.3 91.4 92.7  PLT 217 207  --  177 147* 141*   Cardiac Enzymes: No results for input(s): CKTOTAL, CKMB, CKMBINDEX, TROPONINI in the last 168 hours. BNP: Invalid input(s): POCBNP CBG: No results for input(s): GLUCAP in the last 168 hours. D-Dimer No results for input(s): DDIMER in the last 72 hours. Hgb A1c No results for input(s): HGBA1C in the last 72 hours. Lipid Profile No results for input(s): CHOL, HDL, LDLCALC, TRIG, CHOLHDL, LDLDIRECT in the last 72 hours. Thyroid function studies No results for input(s): TSH, T4TOTAL, T3FREE, THYROIDAB in the last 72 hours.  Invalid input(s): FREET3 Anemia work up Recent Labs    09/26/20 1659  VITAMINB12 228  FOLATE 7.8  FERRITIN 205  TIBC 130*  IRON 8*   Urinalysis    Component Value Date/Time   COLORURINE YELLOW (A) 09/18/2020 1656   APPEARANCEUR CLEAR (A) 09/18/2020 1656   APPEARANCEUR Clear 09/26/2013 1256   LABSPEC 1.015 09/18/2020 1656   LABSPEC 1.015 09/26/2013 1256   PHURINE 5.0 09/18/2020 1656    GLUCOSEU NEGATIVE 09/18/2020 1656   GLUCOSEU Negative 09/26/2013 1256   HGBUR SMALL (A) 09/18/2020 1656   BILIRUBINUR NEGATIVE 09/18/2020 1656   BILIRUBINUR Negative 09/26/2013 1256   KETONESUR NEGATIVE 09/18/2020 1656   PROTEINUR NEGATIVE 09/18/2020 1656   NITRITE NEGATIVE 09/18/2020 1656   LEUKOCYTESUR NEGATIVE 09/18/2020 1656   LEUKOCYTESUR Negative 09/26/2013 1256   Sepsis Labs Invalid input(s): PROCALCITONIN,  WBC,  LACTICIDVEN Microbiology Recent Results (from the past 240 hour(s))  MRSA PCR Screening     Status: None   Collection Time: 09/27/20  8:17 PM   Specimen: Nasopharyngeal  Result Value Ref Range Status   MRSA by PCR NEGATIVE NEGATIVE Final    Comment:        The GeneXpert MRSA Assay (FDA approved for NASAL specimens only), is one component of a comprehensive MRSA colonization surveillance program. It is not intended to diagnose MRSA infection nor to guide or monitor treatment for MRSA infections. Performed at [facility], [address], [place], [place] [address]      Time coordinating discharge: Over 30 minutes  SIGNED:   Lynn Ito, MD  [facility] 09/29/2020, 12:33 PM Pager   If 7PM-7AM, please contact night-coverage [email or web address] Password [email or web address]

## 2020-09-29 NOTE — Progress Notes (Signed)
David [name] to be D/C'd home per MD order.  Discussed prescriptions and follow up appointments with the patient. Prescriptions given to patient, medication list explained in detail. Pt verbalized understanding.  Allergies as of 09/29/2020   No Known Allergies      Medication List     STOP taking these medications    aspirin EC 81 MG tablet   dextromethorphan-guaiFENesin 30-600 MG 12hr tablet Commonly known as: MUCINEX DM   lisinopril 5 MG tablet Commonly known as: ZESTRIL   polyethylene glycol 17 g packet Commonly known as: MIRALAX / GLYCOLAX       TAKE these medications    acetaminophen 325 MG tablet Commonly known as: TYLENOL Take 2 tablets (650 mg total) by mouth every 6 (six) hours as needed for mild pain or headache (fever >/= 101).   amLODipine 5 MG tablet Commonly known as: NORVASC Take 5 mg by mouth daily.   bisacodyl 5 MG EC tablet Commonly known as: DULCOLAX Take 1 tablet (5 mg total) by mouth daily as needed for moderate constipation. What changed: Another medication with the same name was removed. Continue taking this medication, and follow the directions you see here.   donepezil 5 MG tablet Commonly known as: ARICEPT Take 1 tablet (5 mg total) by mouth at bedtime.   ferrous sulfate 325 (65 FE) MG tablet Take 1 tablet (325 mg total) by mouth daily with breakfast. Start taking on: September 30, 2020   Ipratropium-Albuterol 20-100 MCG/ACT Aers respimat Commonly known as: COMBIVENT Inhale 1 puff into the lungs every 6 (six) hours.   latanoprost 0.005 % ophthalmic solution Commonly known as: XALATAN Place 1 drop into both eyes at bedtime.   melatonin 3 MG Tabs tablet Take 1 tablet (3 mg total) by mouth at bedtime.   pantoprazole 40 MG tablet Commonly known as: PROTONIX Take 1 tablet (40 mg total) by mouth daily.   senna-docusate 8.6-50 MG tablet Commonly known as: Senokot-S Take 1 tablet by mouth at bedtime. Hold if frequent BM's or loose  stools   tamsulosin 0.4 MG Caps capsule Commonly known as: FLOMAX Take 1 capsule (0.4 mg total) by mouth daily after supper.   timolol 0.5 % ophthalmic solution Commonly known as: TIMOPTIC Place 1 drop into both eyes daily.               Durable Medical Equipment  (From admission, onward)           Start     Ordered   09/29/20 0816  For home use only DME Hospital bed  Once       Question Answer Comment  Length of Need Lifetime   The above medical condition requires: Patient requires the ability to reposition frequently   Head must be elevated greater than: 45 degrees   Bed type Semi-electric   Hoyer Lift Yes   Trapeze Bar Yes   Support Surface: Alternating Pressure Pad and Pump      09/29/20 0816            Vitals:   09/29/20 1509 09/29/20 1736  BP: 132/60 (!) 146/57  Pulse: (!) 58 63  Resp: 19 16  Temp: 97.8 F (36.6 C) 97.8 F (36.6 C)  SpO2: 100% 96%    Skin clean, dry and intact without evidence of skin break down, no evidence of skin tears noted. IV catheter discontinued intact. Site without signs and symptoms of complications. Dressing and pressure applied. Pt denies pain at this time. No complaints  noted.  An After Visit Summary was printed and given to the patient. Patient escorted via WC, and D/C home via EMS.  Christoffer Currier A Tanekia Ryans

## 2020-09-29 NOTE — Progress Notes (Signed)
Physical Therapy Treatment Patient Details Name: David [name] MRN: [id number] DOB: Jun 29, 1927 Today's Date: 09/29/2020    History of Present Illness David [name] is a 89yoF who comes to [facility] from STR. Patient recently admitted from 09/06/2020-09/12/2020 for acute hypoxic respiratory failure due to COVID-19 pneumonia and CAP. He was also noted to have orthostatic hypotension while in the hospital. PMH: dementia, history of CVA, hypertension, recent admission for COVID-19 pneumonia.    PT Comments    Pt seen for PT evaluation with pt able to participate in supine<>sit but continuing to require max assist, HOB elevated & bed rails. Pt only tolerates sitting EOB ~1 minute before requesting to lie down 2/2 dizziness. Pt is able to scoot to L along side of bed with mod assist prior to sit>supine. Pt with low SPO2 reading but unsure of accuracy 2/2 inconsistent pleth & cold hands - nurse in room to assess pt. PT placed pillow under pt's R hip to promote L sidelying & prevent skin breakdown. Continue to recommend STR upon d/c to maximize independence with functional mobility & reduce caregiver burden.    Follow Up Recommendations  SNF;Supervision for mobility/OOB;Supervision/Assistance - 24 hour     Equipment Recommendations  None recommended by PT    Recommendations for Other Services       Precautions / Restrictions Precautions Precautions: Fall Restrictions Weight Bearing Restrictions: No Other Position/Activity Restrictions: Orthostatic hypotension    Mobility  Bed Mobility Overal bed mobility: Needs Assistance Bed Mobility: Supine to Sit;Sit to Supine     Supine to sit: Max assist;HOB elevated Sit to supine: Max assist;HOB elevated   General bed mobility comments: multimodal cuing but pt able to participate in supine<>sit with use of bed rails, HOB elevated  Transfers                    Ambulation/Gait                 Stairs             Wheelchair  Mobility    Modified Rankin (Stroke Patients Only)       Balance Overall balance assessment: Needs assistance Sitting-balance support: Feet supported;Bilateral upper extremity supported Sitting balance-Leahy Scale: Poor Sitting balance - Comments: posterior lean with static sitting EOB                                    Cognition Arousal/Alertness: Awake/alert Behavior During Therapy: Flat affect Overall Cognitive Status: History of cognitive impairments - at baseline                                 General Comments: AxO to self & location when given choice of 2, unable to recall current year      Exercises      General Comments General comments (skin integrity, edema, etc.): Pt c/o dizziness with sitting EOB, only tolerated sitting EOB ~1 minute before returning supine, reporting dizziness is better but does not completely subside. BP = 152/55, mmHg (LUE), SpO2 with fluctuating pleth line, lowest = 69% but up to 87-89% with nurse made aware & pt with c/o slight SOB.      Pertinent Vitals/Pain Pain Assessment: 0-10 Faces Pain Scale: Hurts little more ("pretty bad") Pain Location: L knee Pain Descriptors / Indicators: Aching Pain Intervention(s): Limited activity within patient's tolerance;Patient requesting  pain meds-RN notified    Home Living                      Prior Function            PT Goals (current goals can now be found in the care plan section) Acute Rehab PT Goals Patient Stated Goal: to feel better PT Goal Formulation: With patient Time For Goal Achievement: 10/09/20 Potential to Achieve Goals: Fair Progress towards PT goals: PT to reassess next treatment    Frequency    Min 2X/week      PT Plan Current plan remains appropriate    Co-evaluation              AM-PAC PT "6 Clicks" Mobility   Outcome Measure  Help needed turning from your back to your side while in a flat bed without using  bedrails?: A Lot Help needed moving from lying on your back to sitting on the side of a flat bed without using bedrails?: Total Help needed moving to and from a bed to a chair (including a wheelchair)?: Total Help needed standing up from a chair using your arms (e.g., wheelchair or bedside chair)?: Total Help needed to walk in hospital room?: Total Help needed climbing 3-5 steps with a railing? : Total 6 Click Score: 7    End of Session   Activity Tolerance: Patient limited by fatigue;Treatment limited secondary to medical complications (Comment) Patient left: in bed;with bed alarm set;with nursing/sitter in room Nurse Communication:  (O2) PT Visit Diagnosis: Unsteadiness on feet (R26.81);Difficulty in walking, not elsewhere classified (R26.2);Muscle weakness (generalized) (M62.81);History of falling (Z91.81)     Time: 6301-6010 PT Time Calculation (min) (ACUTE ONLY): 19 min  Charges:  $Therapeutic Activity: 8-22 mins                     Aleda Grana, PT, DPT 09/29/20, 1:57 PM    Sandi Mariscal 09/29/2020, 1:56 PM

## 2020-09-29 NOTE — TOC Transition Note (Addendum)
Transition of Care [facility]) - CM/SW Discharge Note   Patient Details  Name: David [name] MRN: [id number] Date of Birth: 08-06-1927  Transition of Care [facility]) CM/SW Contact:  Margarito Liner, LCSW Phone Number: 09/29/2020, 3:13 PM   Clinical Narrative:  Patient has orders to discharge home with hospice today. DME has been delivered. EMS transport set up for 3:45 pm. Daughter-in-law has questions about managing foley, changing him in the bed, rolling him in the bed, and when hospice is coming out. RN and Boyd Kerbs with [facility] will call her. Daughter-in-law asked if we would be sending medications home with him. CSW explained that they were sent to [facility] in [place] and they would have to go pick those up. No further concerns. CSW signing off.  3:26 pm: Transport pushed back to around 5:30. Per RN, daughter-in-law going to [employer] to get patient some supplies before he returns home. CSW signing off.  Final next level of care: Home w Hospice Care Barriers to Discharge: Barriers Resolved   Patient Goals and CMS Choice Patient states their goals for this hospitalization and ongoing recovery are:: SNF rehab CMS Medicare.gov Compare Post Acute Care list provided to:: Patient Represenative (must comment) Choice offered to / list presented to : Adult Children  Discharge Placement                Patient to be transferred to facility by: EMS Name of family member notified: Minerva Areola and Alean Rinne Patient and family notified of of transfer: 09/29/20  Discharge Plan and Services                DME Arranged: Hospital bed,Overbed table DME Agency: Other - Comment ([facility] ordered) Date DME Agency Contacted: 09/28/20                Social Determinants of Health (SDOH) Interventions     Readmission Risk Interventions No flowsheet data found.

## 2020-09-29 NOTE — Progress Notes (Addendum)
[facility] Room 209 [facility]) Hospital Liaison RN note:  Visited patient in room. He was alert and oriented and resting in bed. Stated he had a bad night but was feeling better this morning. Spoke with daughter in law, Janett Billow over the phone to provide update. DME is supposed to be delivered between 2-3 pm. Charlynn Court, [facility] aware. Plan is to discharge to son, Eric's home once DME is in place.  Please send signed and completed DNR with patient and please provide prescriptions at discharge as needed to ensure ongoing symptom management.  Please call with any hospice related questions or concerns.  Cyndra Numbers, RN [facility] Liaison [phone]

## 2020-09-29 NOTE — TOC Progression Note (Signed)
Transition of Care [facility]) - Progression Note    Patient Details  Name: David [name] MRN: [id number] Date of Birth: 22-Nov-1926  Transition of Care [facility]) CM/SW Contact  Margarito Liner, LCSW Phone Number: 09/29/2020, 12:53 PM  Clinical Narrative:  Per Boyd Kerbs with [facility], DME will be delivered sometime between 2:00 and 3:00. CSW called to notify son and daughter-in-law. Daughter-in-law will call CSW once DME has been delivered and set up so transport can be arranged. She confirmed address is [address], [place], [place] [address].   Expected Discharge Plan: Skilled Nursing Facility Barriers to Discharge: Continued Medical Work up  Expected Discharge Plan and Services Expected Discharge Plan: Skilled Nursing Facility       Living arrangements for the past 2 months: Single Family Home Expected Discharge Date: 09/29/20                                     Social Determinants of Health (SDOH) Interventions    Readmission Risk Interventions No flowsheet data found.

## 2020-10-24 DEATH — deceased

## 2021-02-28 IMAGING — CT CT ANGIO CHEST
2 of 6 series · 17 of 46 positions shown · IV contrast (APPLIED)
Comparison: Chest CT dated 02/24/2008 and CT abdomen pelvis dated
09/27/2013.

CLINICAL DATA: [AGE] male with recent diagnosis of 7GOWY-E2.
Concern for pulmonary embolism. Diarrhea.

EXAM:
CT ANGIOGRAPHY CHEST
CT ABDOMEN AND PELVIS WITH CONTRAST
TECHNIQUE: Multidetector CT imaging of the chest was performed using the
standard protocol during bolus administration of intravenous
contrast. Multiplanar CT image reconstructions and MIPs were
obtained to evaluate the vascular anatomy. Multidetector CT imaging
of the abdomen and pelvis was performed using the standard protocol
during bolus administration of intravenous contrast.
CONTRAST:  60mL OMNIPAQUE IOHEXOL 350 MG/ML SOLN

[Series 5: thins · axial · 0.72mm/px · z∈[-282,+20]mm · 14 of 333 slices shown]
[im 15/333  lung]
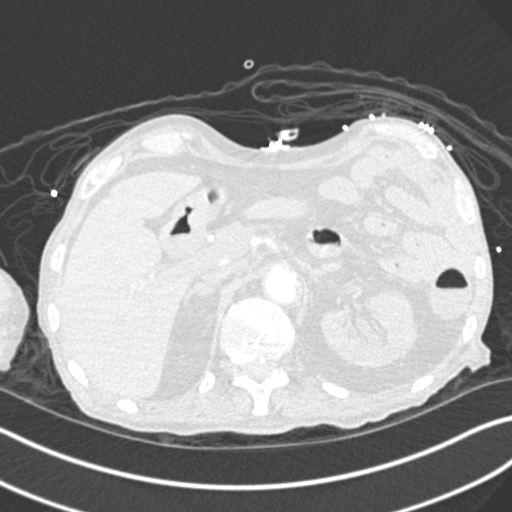
[im 44/333  soft-tissue]
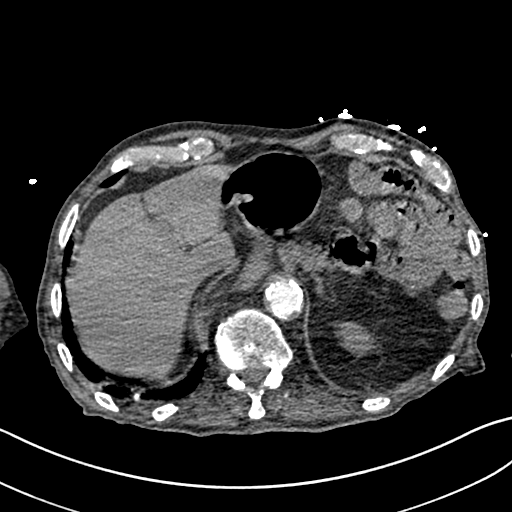
[im 58/333  lung]
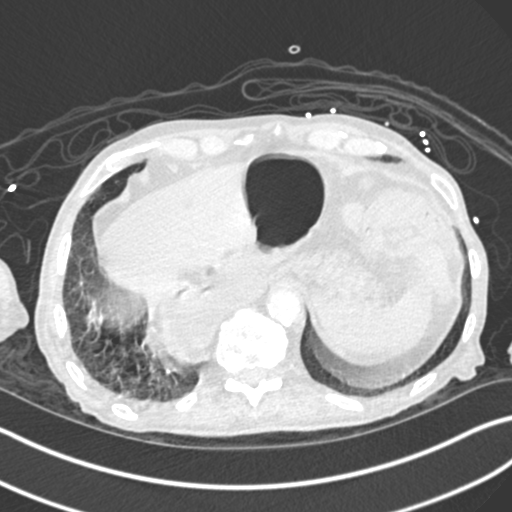
[im 87/333  soft-tissue]
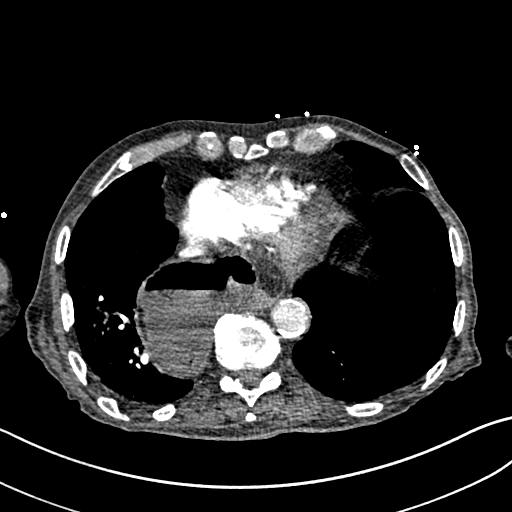
[im 116/333  lung]
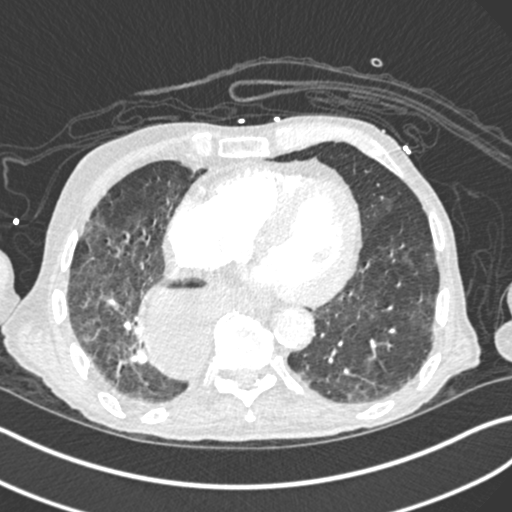
[im 130/333  soft-tissue]
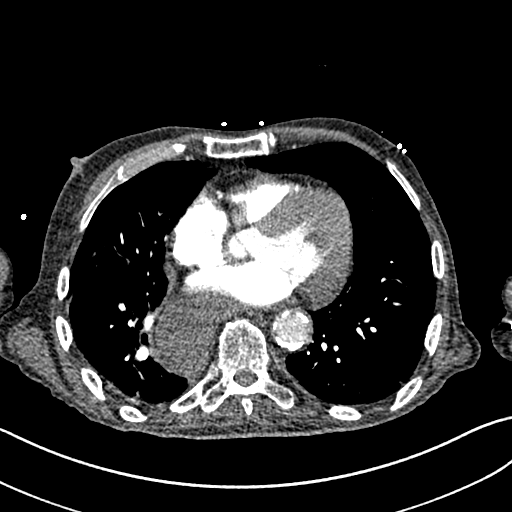
[im 159/333  lung]
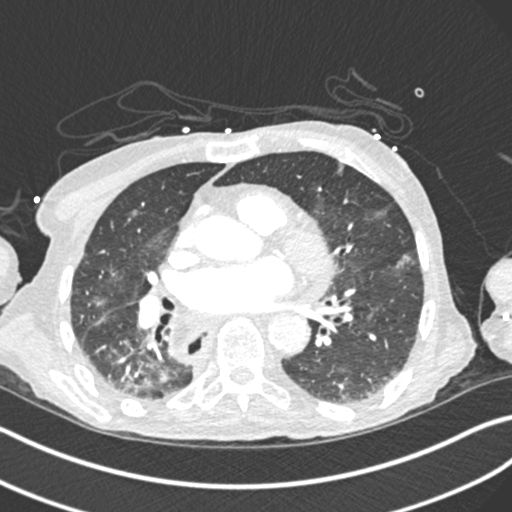
[im 174/333  soft-tissue]
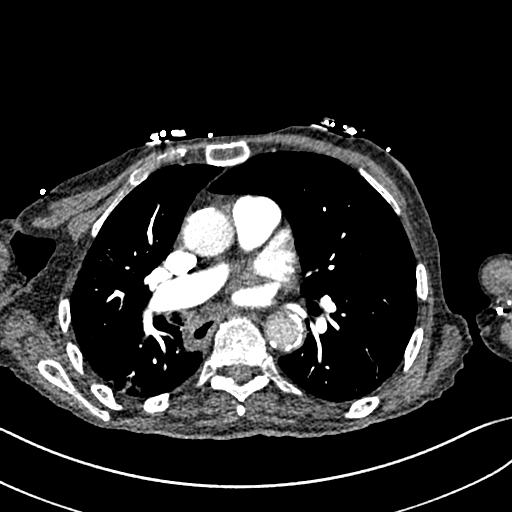
[im 203/333  lung]
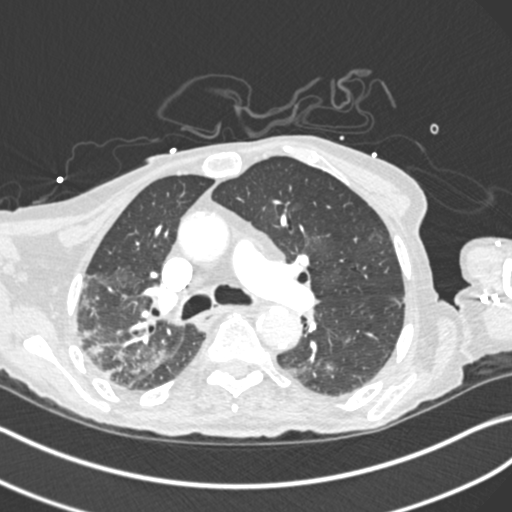
[im 217/333  soft-tissue]
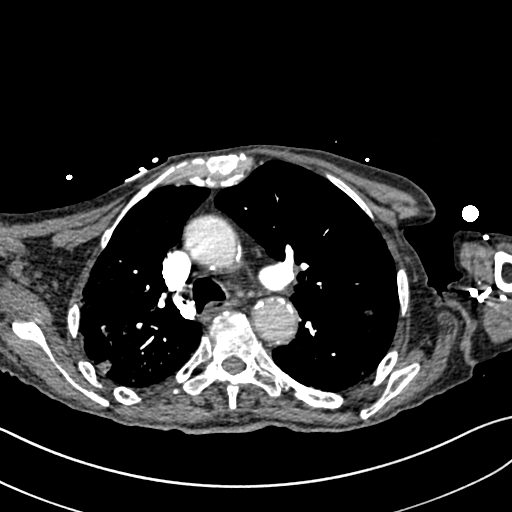
[im 246/333  lung]
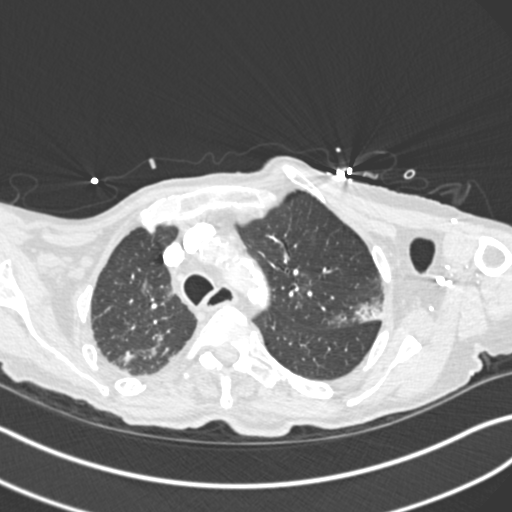
[im 275/333  soft-tissue]
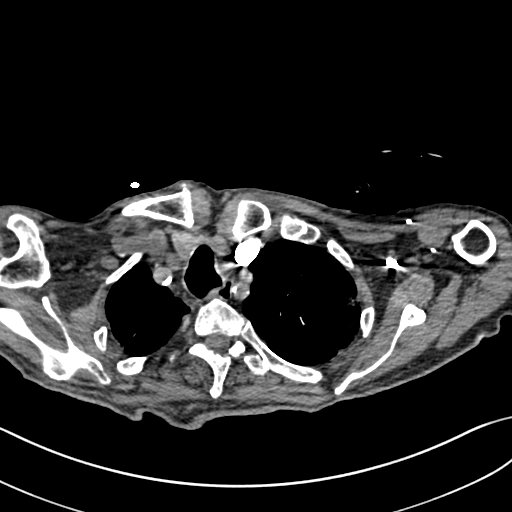
[im 289/333  lung]
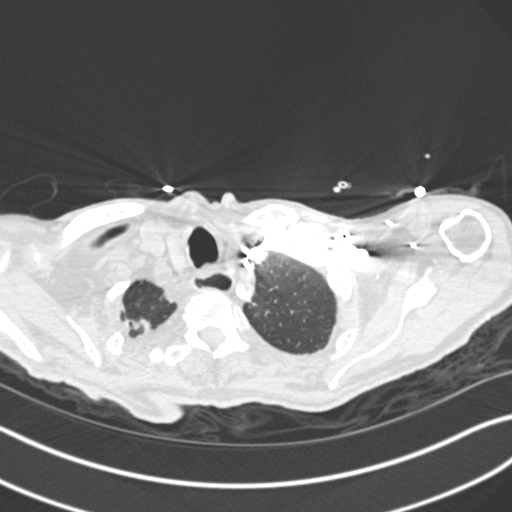
[im 318/333  soft-tissue]
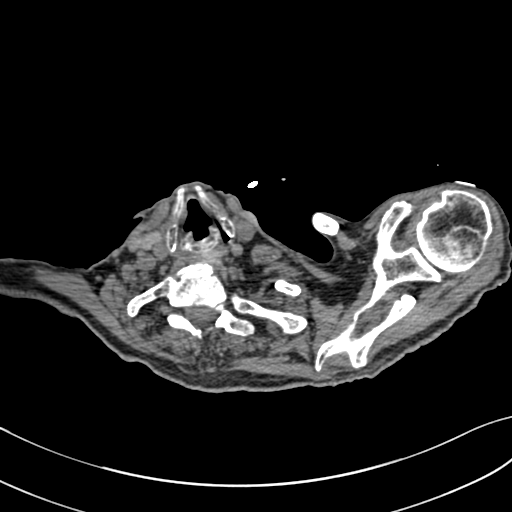

[Series 7: coronal mpr · coronal · 0.72mm/px · 3 of 114 slices shown]
[im 29/114  soft-tissue]
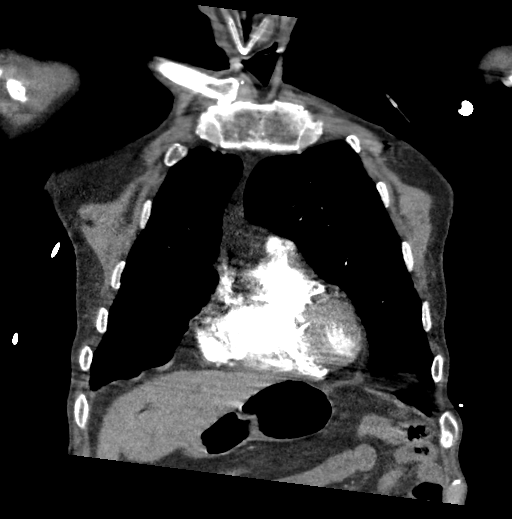
[im 57/114  soft-tissue]
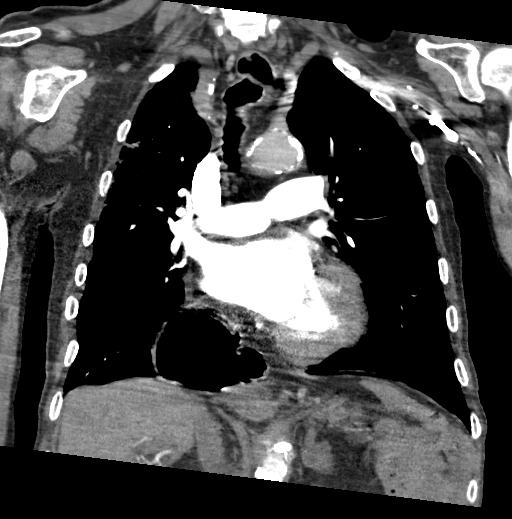
[im 85/114  soft-tissue]
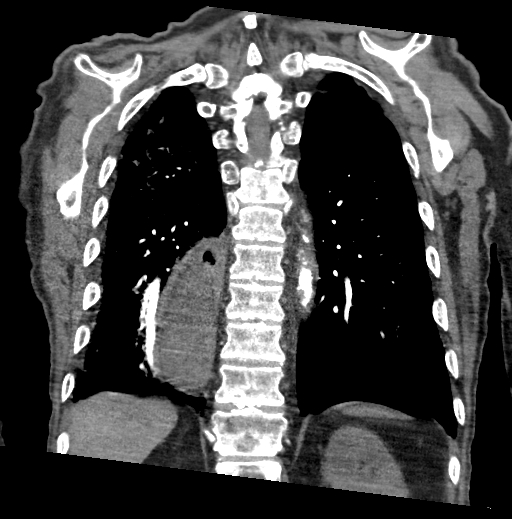

[17 of 46 positions shown; findings below may reference images not displayed]

FINDINGS: CTA CHEST FINDINGS

Cardiovascular: There is no cardiomegaly or pericardial effusion.
Three-vessel coronary vascular calcification. Advanced
atherosclerotic calcification of the thoracic aorta. No aneurysmal
dilatation or dissection. Evaluation of the pulmonary arteries is
limited due to respiratory motion artifact. No pulmonary artery
embolus identified.

Mediastinum/Nodes: There is no hilar or mediastinal adenopathy.
There is a large hiatal hernia. The esophagus is grossly
unremarkable. No mediastinal fluid collection.

Lungs/Pleura: Background of emphysema. Bilateral confluent
ground-glass opacities, right greater left most consistent with
multifocal pneumonia and in keeping with provided history of
7GOWY-E2. Clinical correlation is recommended. There is no pleural
effusion pneumothorax. The central airways are patent.

Musculoskeletal: Osteopenia with degenerative changes of the spine.
Old healed right rib fractures. No acute osseous pathology.

Review of the MIP images confirms the above findings.

CT ABDOMEN and PELVIS FINDINGS

No intra-abdominal free air or free fluid.

Hepatobiliary: Multiple hepatic hypodense lesions measure up to
cm in the left lobe of the liver. The larger lesions demonstrate
fluid attenuation consistent with cysts and smaller lesions are too
small to characterize. No intrahepatic biliary dilatation. The
gallbladder is unremarkable.

Pancreas: Unremarkable. No pancreatic ductal dilatation or
surrounding inflammatory changes.

Spleen: Normal in size without focal abnormality.

Adrenals/Urinary Tract: The adrenal glands unremarkable. There is
mild bilateral hydronephroureter. There is a 2.5 cm right renal
cyst. There is a 7 mm nonobstructing left renal interpolar calculus.
There is mild right perinephric stranding with apparent enhancement
of the urothelium of the right renal collecting system and ureter
right ureter. Correlation with urinalysis recommended to evaluate
for UTI. The urinary bladder is distended. Linear calcific density
along the right posterior bladder wall may represent layering stone
or focal calcification of the bladder wall.

Stomach/Bowel: There is a large hiatal hernia. There is no bowel
obstruction. There is loose stool throughout the colon consistent
with provided history of diarrheal state. Thickened appearance of
the colon may be related to underdistention or represent mild
colitis. Clinical correlation is recommended. The appendix is
normal.

Vascular/Lymphatic: Advanced aortoiliac atherosclerotic disease. The
IVC is unremarkable. No portal venous gas. There is no adenopathy.

Reproductive: The prostate and seminal vesicles are grossly
unremarkable.

Other: None

Musculoskeletal: Osteopenia with degenerative changes of the spine
and scoliosis. Lower lumbar posterior fusion. No acute osseous
pathology.

Review of the MIP images confirms the above findings.
IMPRESSION: 1. No CT evidence of pulmonary embolism.
2. Multifocal pneumonia in keeping with provided history of
7GOWY-E2. Clinical correlation is recommended.
3. Mild bilateral hydronephroureter. Correlation with urinalysis
recommended to evaluate for UTI.
4. A 7 mm nonobstructing left renal interpolar calculus.
5. Diarrheal state. Thickened appearance of the colon may be related
to underdistention or represent mild colitis. Clinical correlation
is recommended. No bowel obstruction. Normal appendix.
6. Large hiatal hernia.
7. Aortic Atherosclerosis (8WN3X-DQN.N) and Emphysema (8WN3X-UC3.7).

## 2021-03-08 IMAGING — US US RENAL
1 series · 13 of 25 positions shown · non-contrast
Comparison: CT 09/18/2020.

CLINICAL DATA: Acute renal injury.

EXAM:
RENAL / URINARY TRACT ULTRASOUND COMPLETE

[Series 1: us renal · 13 of 63 slices shown]
[im 1/63]
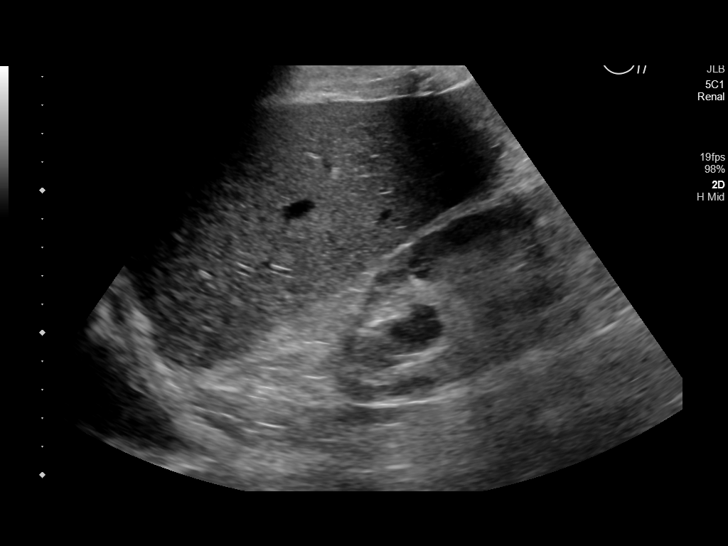
[im 6/63]
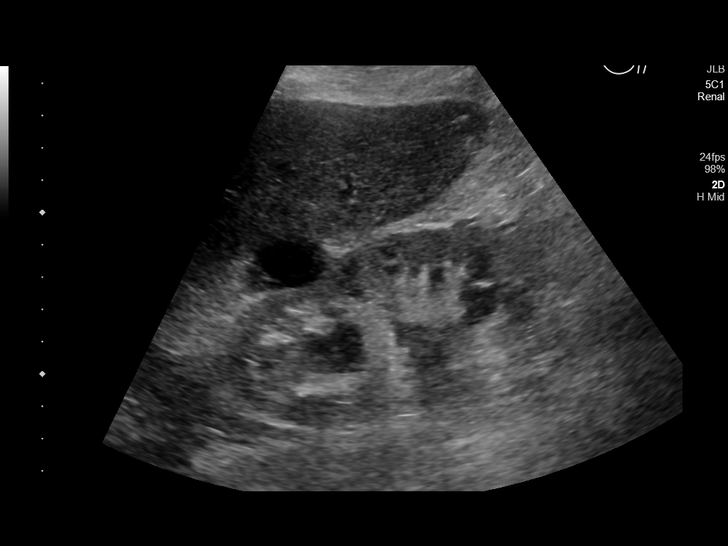
[im 11/63]
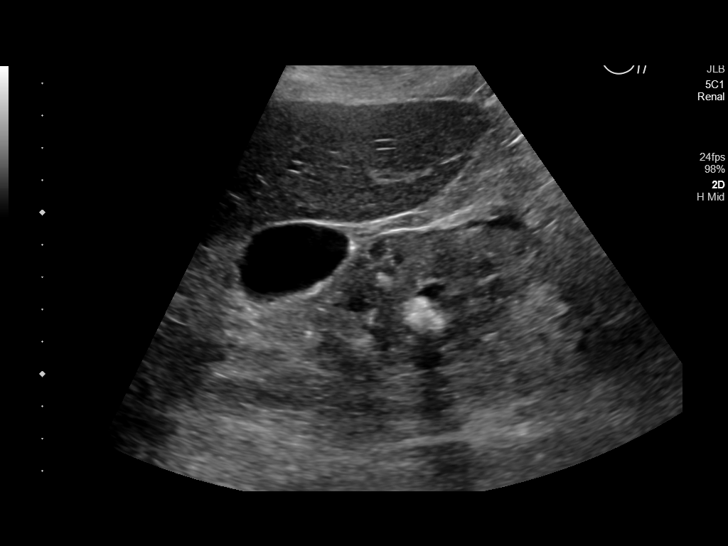
[im 16/63]
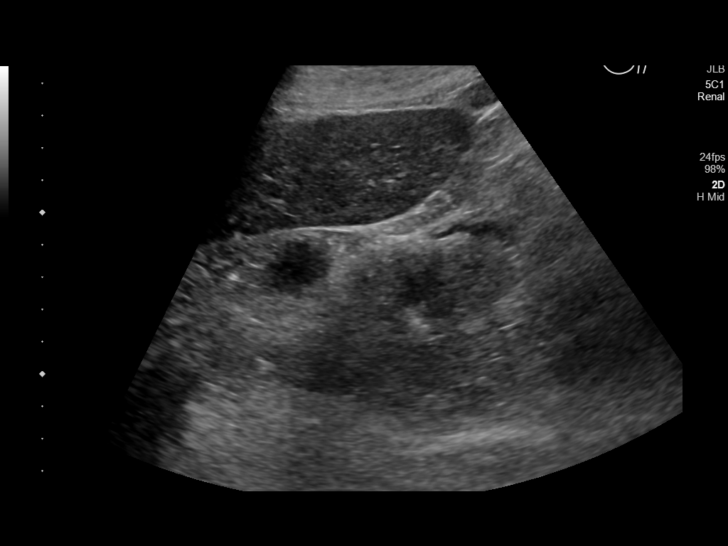
[im 21/63]
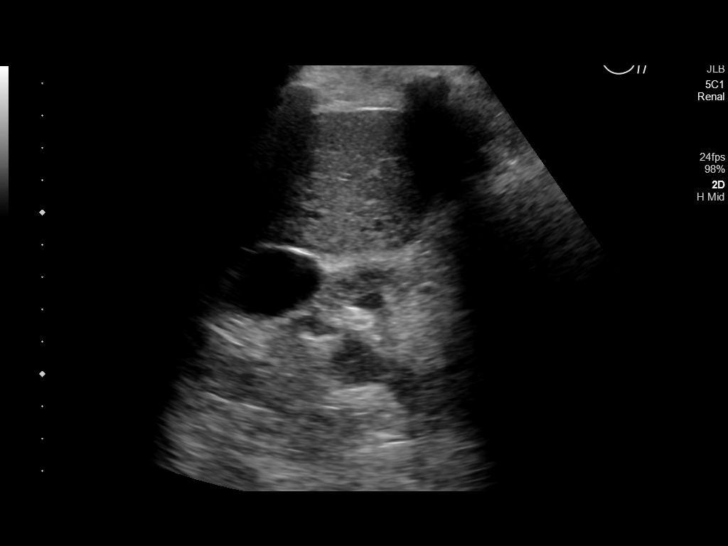
[im 26/63]
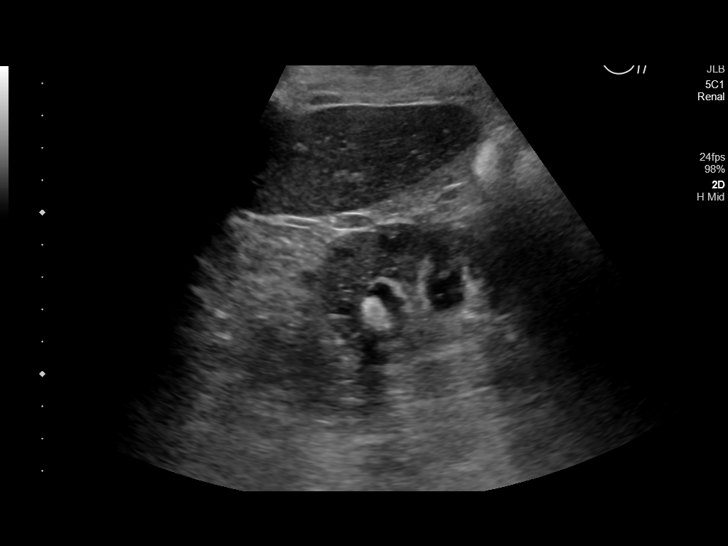
[im 32/63]
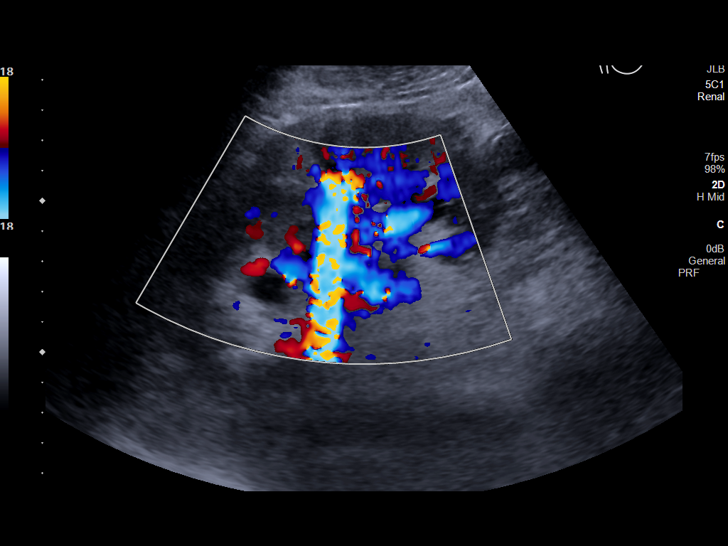
[im 37/63]
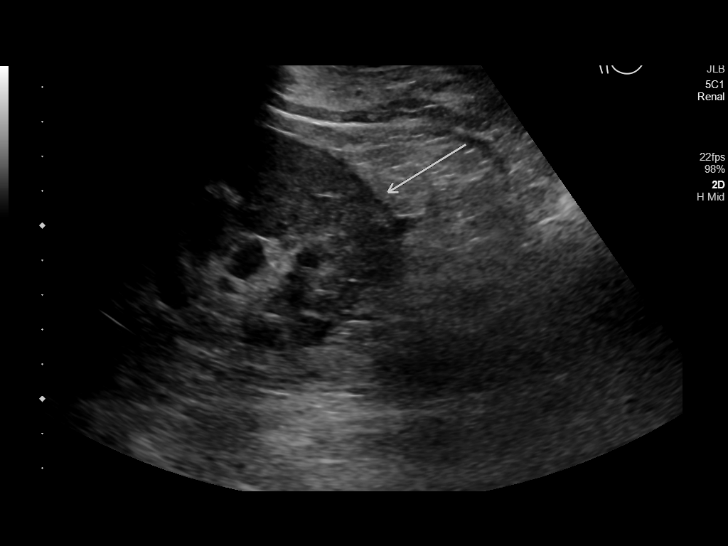
[im 42/63]
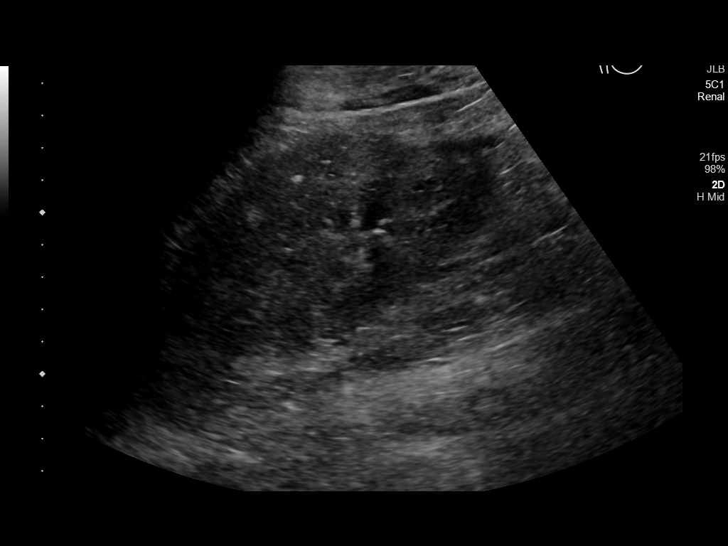
[im 47/63]
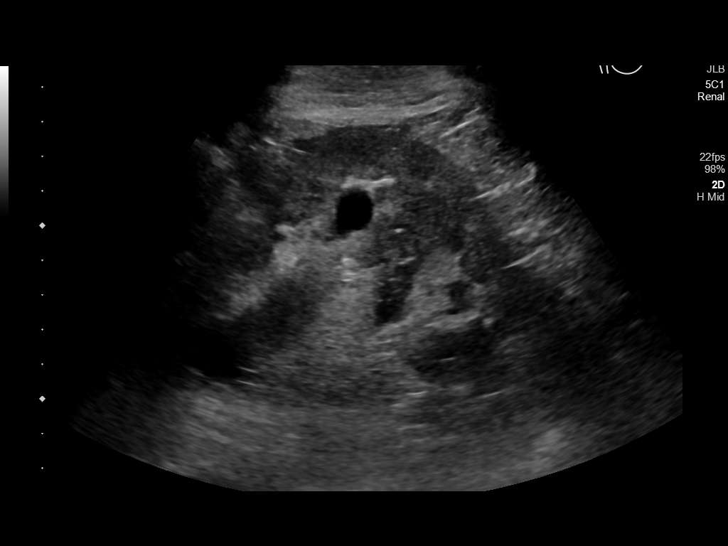
[im 52/63]
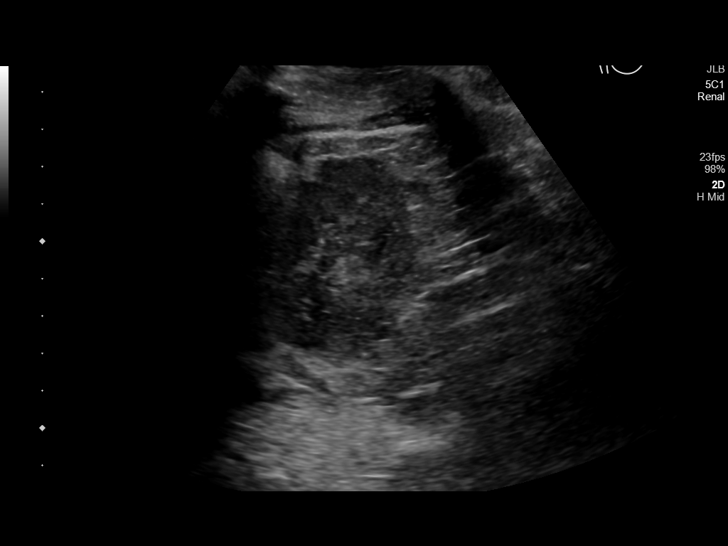
[im 57/63]
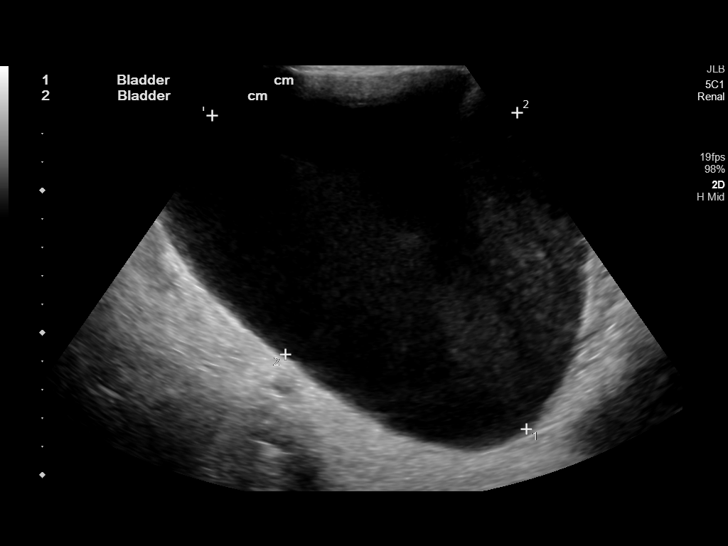
[im 63/63]
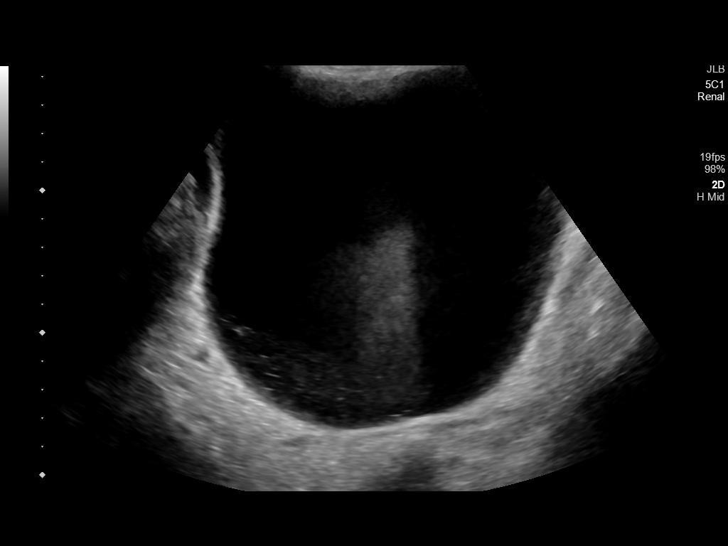

[13 of 25 positions shown; findings below may reference images not displayed]

FINDINGS: Right Kidney:

Renal measurements: 10.9 x 5.3 x 5.3 cm = volume: 157.9 mL.
Echogenicity within normal limits. Mild hydronephrosis. Multiple
renal calyceal stones versus sloughed papilla. A renal pelvic stone
measuring up to 1.2 cm cannot be excluded. It should be noted that
no renal stones on the right were noted on CT of 09/18/2020. [DATE] cm
right renal simple cyst again noted.

Left Kidney:

Renal measurements: 13.1 x 7.3 x 6.1 cm = volume: 3 of 3.4 mL.
Echogenicity within normal limits. Minimal amount of left
perinephric fluid. Mild left hydronephrosis. 9 mm left renal
calyceal stone again noted.

Bladder:

Bladder is distended and contains mild debris. No ureteral jets
noted.

Other:

None.
IMPRESSION: 1. Mild bilateral hydronephrosis. Minimal amount of left perinephric
fluid noted. Bladder is distended and contains mild debris. No
ureteral jets noted.

2. Multiple right renal calyceal stones versus sloughed papilla. A
renal pelvic stone measuring up to 1.2 cm cannot be excluded. It
should be noted that no renal stones on the right were noted on CT
of 09/18/2020.

[DATE].  [DATE] mm left renal calyceal stone again noted.

4.  3.6 cm simple right renal cyst again noted.

5.  Mild right renal atrophy.
